# Patient Record
Sex: Male | Born: 1945 | Race: White | Hispanic: No | Marital: Married | Smoking: Never smoker
Health system: Southern US, Community
[De-identification: ages and names within clinical notes are randomized; demographics above are authoritative.]

## PROBLEM LIST (undated history)

## (undated) DIAGNOSIS — I2510 Atherosclerotic heart disease of native coronary artery without angina pectoris: Secondary | ICD-10-CM

## (undated) DIAGNOSIS — E785 Hyperlipidemia, unspecified: Secondary | ICD-10-CM

## (undated) DIAGNOSIS — M1711 Unilateral primary osteoarthritis, right knee: Secondary | ICD-10-CM

## (undated) DIAGNOSIS — C801 Malignant (primary) neoplasm, unspecified: Secondary | ICD-10-CM

## (undated) DIAGNOSIS — M1990 Unspecified osteoarthritis, unspecified site: Secondary | ICD-10-CM

## (undated) HISTORY — PX: COLONOSCOPY: SHX174

## (undated) HISTORY — DX: Atherosclerotic heart disease of native coronary artery without angina pectoris: I25.10

## (undated) HISTORY — PX: OTHER SURGICAL HISTORY: SHX169

## (undated) HISTORY — DX: Hyperlipidemia, unspecified: E78.5

---

## 2000-10-15 HISTORY — PX: CORONARY ANGIOPLASTY WITH STENT PLACEMENT: SHX49

## 2000-11-09 ENCOUNTER — Ambulatory Visit (HOSPITAL_COMMUNITY): Admission: RE | Admit: 2000-11-09 | Discharge: 2000-11-10 | Payer: Self-pay | Admitting: Cardiovascular Disease

## 2000-11-09 ENCOUNTER — Encounter: Payer: Self-pay | Admitting: Cardiovascular Disease

## 2001-02-21 ENCOUNTER — Ambulatory Visit (HOSPITAL_COMMUNITY): Admission: RE | Admit: 2001-02-21 | Discharge: 2001-02-21 | Payer: Self-pay | Admitting: Gastroenterology

## 2006-03-21 ENCOUNTER — Encounter: Admission: RE | Admit: 2006-03-21 | Discharge: 2006-03-21 | Payer: Self-pay | Admitting: Internal Medicine

## 2006-07-15 HISTORY — PX: KNEE ARTHROSCOPY W/ MENISCECTOMY: SHX1879

## 2012-02-09 HISTORY — PX: KNEE ARTHROSCOPY W/ MENISCECTOMY: SHX1879

## 2012-02-16 DIAGNOSIS — Z23 Encounter for immunization: Secondary | ICD-10-CM

## 2012-03-07 HISTORY — PX: US ECHOCARDIOGRAPHY: HXRAD669

## 2013-01-09 ENCOUNTER — Other Ambulatory Visit: Payer: Self-pay | Admitting: *Deleted

## 2013-01-09 MED ORDER — ISOSORBIDE MONONITRATE ER 30 MG PO TB24: 30.0000 mg | ORAL_TABLET | Freq: Every day | ORAL | Status: DC

## 2013-01-09 MED ORDER — RAMIPRIL 10 MG PO CAPS: 10.0000 mg | ORAL_CAPSULE | Freq: Every day | ORAL | Status: DC

## 2013-01-09 MED ORDER — ATORVASTATIN CALCIUM 80 MG PO TABS: 80.0000 mg | ORAL_TABLET | Freq: Every day | ORAL | Status: DC

## 2013-01-09 MED ORDER — EZETIMIBE 10 MG PO TABS: 10.0000 mg | ORAL_TABLET | Freq: Every day | ORAL | Status: DC

## 2013-01-09 MED ORDER — METOPROLOL SUCCINATE ER 25 MG PO TB24: 25.0000 mg | ORAL_TABLET | Freq: Every day | ORAL | Status: DC

## 2013-03-12 ENCOUNTER — Ambulatory Visit: Payer: 59

## 2013-05-29 ENCOUNTER — Encounter: Payer: Self-pay | Admitting: Cardiovascular Disease

## 2013-05-29 ENCOUNTER — Ambulatory Visit (INDEPENDENT_AMBULATORY_CARE_PROVIDER_SITE_OTHER): Payer: 59 | Admitting: Cardiovascular Disease

## 2013-05-29 VITALS — BP 102/60 | HR 66 | Ht 71.0 in | Wt 203.2 lb

## 2013-05-29 DIAGNOSIS — I10 Essential (primary) hypertension: Secondary | ICD-10-CM

## 2013-05-29 DIAGNOSIS — I2510 Atherosclerotic heart disease of native coronary artery without angina pectoris: Secondary | ICD-10-CM

## 2013-05-29 DIAGNOSIS — C439 Malignant melanoma of skin, unspecified: Secondary | ICD-10-CM

## 2013-05-29 DIAGNOSIS — E7849 Other hyperlipidemia: Secondary | ICD-10-CM

## 2013-05-29 DIAGNOSIS — E782 Mixed hyperlipidemia: Secondary | ICD-10-CM

## 2013-05-29 DIAGNOSIS — E785 Hyperlipidemia, unspecified: Secondary | ICD-10-CM

## 2013-05-29 MED ORDER — EZETIMIBE 10 MG PO TABS: 10.0000 mg | ORAL_TABLET | Freq: Every day | ORAL | Status: DC

## 2013-05-29 MED ORDER — RAMIPRIL 10 MG PO CAPS: 10.0000 mg | ORAL_CAPSULE | Freq: Every day | ORAL | Status: DC

## 2013-05-29 MED ORDER — METOPROLOL SUCCINATE ER 25 MG PO TB24: 25.0000 mg | ORAL_TABLET | Freq: Every day | ORAL | Status: DC

## 2013-05-29 MED ORDER — ISOSORBIDE MONONITRATE ER 30 MG PO TB24: 30.0000 mg | ORAL_TABLET | Freq: Every day | ORAL | Status: DC

## 2013-05-29 MED ORDER — ATORVASTATIN CALCIUM 80 MG PO TABS: 80.0000 mg | ORAL_TABLET | Freq: Every day | ORAL | Status: DC

## 2013-05-29 NOTE — Patient Instructions (Signed)
Your physician recommends that you schedule a follow-up appointment in: 1 YEAR.  Your physician recommends that you return for lab work. NMR to be done by PCP.

## 2013-06-14 ENCOUNTER — Encounter: Payer: Self-pay | Admitting: Cardiovascular Disease

## 2013-06-14 DIAGNOSIS — C439 Malignant melanoma of skin, unspecified: Secondary | ICD-10-CM | POA: Insufficient documentation

## 2013-06-14 DIAGNOSIS — I2510 Atherosclerotic heart disease of native coronary artery without angina pectoris: Secondary | ICD-10-CM | POA: Insufficient documentation

## 2013-06-14 DIAGNOSIS — I10 Essential (primary) hypertension: Secondary | ICD-10-CM | POA: Insufficient documentation

## 2013-06-14 DIAGNOSIS — E7849 Other hyperlipidemia: Secondary | ICD-10-CM | POA: Insufficient documentation

## 2013-06-14 NOTE — Progress Notes (Signed)
Patient ID: RAMONTE MENA, male   DOB: 20-Jan-1946, 68 y.o.   MRN: [id number]     HPI: CRAIGE PATEL is a 68 y.o. male who presents to the office for her one-year cardiology evaluation. I last saw him in December 2013.  Mr. 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 When I first saw him in 1996, his total cholesterol is 510 and his LDL cholesterol was 442. Since that time he has been on aggressive combination medical therapy. In June 2002 he underwent stenting of a subtotal occlusion of a circumflex vessel which time a 2.75x23 mm HepaCoat stent was inserted. 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 A nuclear perfusion study in October 2011 continued to show normal perfusion without scar or ischemia.  Over the past year, he has continued to do well. He remains active. 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 He tells me Dr. Sharlett Iles had recently checked laboratory. I will try to get these results.  He has been maintained on atorvastatin 80 mg and Zetia 10 mg for his marked hyperlipidemia. His blood pressure has been treated with metoprolol succinate 25 mg as well as all taste 10 mg. He also is on low dose isosorbide mononitrate and has not been experiencing any recurrent anginal symptoms.  Past Medical History  Diagnosis Date  . Hyperlipidemia     History reviewed. No pertinent past surgical history.  Not on File  Current Outpatient Prescriptions  Medication Sig Dispense Refill  . aspirin 81 MG tablet Take 81 mg by mouth daily.      Marland Kitchen atorvastatin (LIPITOR) 80 MG tablet Take 1 tablet (80 mg total) by mouth daily.  30 tablet  11  . ezetimibe (ZETIA) 10 MG tablet Take 1 tablet (10 mg total) by mouth daily.  30 tablet  11  . isosorbide mononitrate (IMDUR) 30 MG 24 hr  tablet Take 1 tablet (30 mg total) by mouth daily.  30 tablet  11  . metoprolol succinate (TOPROL XL) 25 MG 24 hr tablet Take 1 tablet (25 mg total) by mouth daily.  30 tablet  11  . ramipril (ALTACE) 10 MG capsule Take 1 capsule (10 mg total) by mouth daily.  30 capsule  11  . fluticasone (FLONASE) 50 MCG/ACT nasal spray Place 1 spray into both nostrils as needed.       No current facility-administered medications for this visit.    History   Social History  . Marital Status: Married    Spouse Name: N/A    Number of Children: N/A  . Years of Education: N/A   Occupational History  . Not on file.   Social History Main Topics  . Smoking status: Never Smoker   . Smokeless tobacco: Never Used  . Alcohol Use: 5.0 oz/week    10 drink(s) per week     Comment: wine  . Drug Use: Not on file  . Sexual Activity: Not on file   Other Topics Concern  . Not on file   Social History Narrative  . No narrative on file    Family History  Problem Relation Age of Onset  . Heart disease Mother     ROS is negative for fevers, chills or night sweats.  Recent resection of an early melanoma  by Dr. Jarome Matin. He denies change in vision or hearing. He denies change in activity. There is no wheezing. He denies cough or sputum production. There is no chest pain. There is no presyncope or syncope. He denies abdominal tenderness nausea or vomiting. There is no change in bowel or bladder habits. There is no claudication. There is no edema. Is no history of diabetes mellitus. He denies significant musculoskeletal issues. He denies problems with sleep. There is no thyroid abnormalities. Other comprehensive 14 point system review is negative.  PE BP 102/60  Pulse 66  Ht 5\' 11"  (1.803 m)  Wt 203 lb 3.2 oz (92.171 kg)  BMI 28.35 kg/m2  General: Alert, oriented, no distress.  Skin: normal turgor, no rashes HEENT: Normocephalic, atraumatic. Pupils round and reactive; sclera anicteric;no lid lag.  Extraocular muscles intact. Nose without nasal septal hypertrophy Mouth/Parynx benign; Mallinpatti scale2  Neck: No JVD, no carotid bruits; normal carotid upstroke Lungs: clear to ausculatation and percussion; no wheezing or rales Chest wall: no tenderness to palpitation Heart: RRR, s1 s2 normal faint 1/6 systolic murmur. Abdomen: soft, nontender; no hepatosplenomehaly, BS+; abdominal aorta nontender and not dilated by palpation. Back: no CVA tenderness Pulses 2+ Extremities: no clubbing cyanosis or edema, Homan's sign negative  Neurologic: grossly nonfocal; cranial nerves grossly normal. Psychologic: normal affect and mood.  ECG (independently read by me): Normal sinus rhythm. No significant ST-T changes. PoorR wave progression.  LABS:  BMET No results found for this basename: na, k, cl, co2, glucose, bun, creatinine, calcium, gfrnonaa, gfraa     Hepatic Function Panel  No results found for this basename: prot, albumin, ast, alt, alkphos, bilitot, bilidir, ibili     CBC No results found for this basename: wbc, rbc, hgb, hct, plt, mcv, mch, mchc, rdw, neutrabs, lymphsabs, monoabs, eosabs, basosabs     BNP No results found for this basename: probnp    Lipid Panel  No results found for this basename: chol, trig, hdl, cholhdl, vldl, ldlcalc     RADIOLOGY: No results found.    ASSESSMENT AND PLAN: Mr. 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 Over the last 19 years, we have been aggressively managing his lipid studies. He has been maintained on atorvastatin 80 mg in addition to Zetia 10 mg with good results. His blood pressure is well-controlled today on metoprolol 25 mg and Altace 10 mg. He is not having any anginal symptoms on his current dose of isosorbide mononitrate in addition to his beta blocker therapy. He did have recent laboratory done by  Dr. Sharlett Iles and I will try to obtain these results to make certain he is still being aggressively treated. If he is not, he may also only be a candidate for an investigative drug trial currently ongoing which may significantly inhibit the degradation of the  LDL receptor.  He tells me he also underwent recent resection for an early melanoma and is doing well. As long as he remains stable I will see him in one year for followup evaluation.     Troy Sine, MD, [facility]  06/14/2013 10:48 PM

## 2013-09-24 ENCOUNTER — Telehealth: Payer: Self-pay | Admitting: Cardiovascular Disease

## 2013-09-24 ENCOUNTER — Other Ambulatory Visit: Payer: Self-pay | Admitting: *Deleted

## 2013-09-24 MED ORDER — EZETIMIBE 10 MG PO TABS: 10.0000 mg | ORAL_TABLET | Freq: Every day | ORAL | Status: DC

## 2013-09-24 NOTE — Telephone Encounter (Signed)
Needs Zetia refill--Did not get filled when he picked up  Going out of town and said pharmacy submitted.  Please call

## 2014-01-11 NOTE — Telephone Encounter (Signed)
Refilled #[phone] with 11 refills on 09/24/13

## 2014-05-22 ENCOUNTER — Telehealth: Payer: Self-pay | Admitting: Cardiovascular Disease

## 2014-05-22 NOTE — Telephone Encounter (Signed)
Returned call to patient he stated already taken care of.

## 2014-05-22 NOTE — Telephone Encounter (Signed)
Pt called in stating that [facility] faxed over a prior authorization form for a new medication that they are recommending he start taking. Please call and notify him that the fax was received.   Thanks

## 2014-06-19 ENCOUNTER — Other Ambulatory Visit: Payer: Self-pay

## 2014-06-19 MED ORDER — ATORVASTATIN CALCIUM 80 MG PO TABS: 80.0000 mg | ORAL_TABLET | Freq: Every day | ORAL | Status: DC

## 2014-06-19 MED ORDER — RAMIPRIL 10 MG PO CAPS: 10.0000 mg | ORAL_CAPSULE | Freq: Every day | ORAL | Status: DC

## 2014-06-19 MED ORDER — ISOSORBIDE MONONITRATE ER 30 MG PO TB24: 30.0000 mg | ORAL_TABLET | Freq: Every day | ORAL | Status: DC

## 2014-06-19 MED ORDER — METOPROLOL SUCCINATE ER 25 MG PO TB24: 25.0000 mg | ORAL_TABLET | Freq: Every day | ORAL | Status: DC

## 2014-06-19 NOTE — Telephone Encounter (Signed)
Rx sent to pharmacy   

## 2014-06-24 ENCOUNTER — Ambulatory Visit: Payer: Self-pay | Admitting: Cardiovascular Disease

## 2014-07-05 ENCOUNTER — Ambulatory Visit (INDEPENDENT_AMBULATORY_CARE_PROVIDER_SITE_OTHER): Payer: 59 | Admitting: Cardiovascular Disease

## 2014-07-05 VITALS — BP 130/60 | HR 54 | Ht 70.0 in | Wt 202.0 lb

## 2014-07-05 DIAGNOSIS — E7849 Other hyperlipidemia: Secondary | ICD-10-CM

## 2014-07-05 DIAGNOSIS — E785 Hyperlipidemia, unspecified: Secondary | ICD-10-CM

## 2014-07-05 DIAGNOSIS — Z01818 Encounter for other preprocedural examination: Secondary | ICD-10-CM

## 2014-07-05 DIAGNOSIS — I2510 Atherosclerotic heart disease of native coronary artery without angina pectoris: Secondary | ICD-10-CM

## 2014-07-05 DIAGNOSIS — I10 Essential (primary) hypertension: Secondary | ICD-10-CM

## 2014-07-05 MED ORDER — ATORVASTATIN CALCIUM 80 MG PO TABS: 80.0000 mg | ORAL_TABLET | Freq: Every day | ORAL | Status: DC

## 2014-07-05 MED ORDER — METOPROLOL SUCCINATE ER 25 MG PO TB24: 25.0000 mg | ORAL_TABLET | Freq: Every day | ORAL | Status: DC

## 2014-07-05 MED ORDER — RAMIPRIL 10 MG PO CAPS: 10.0000 mg | ORAL_CAPSULE | Freq: Every day | ORAL | Status: DC

## 2014-07-05 MED ORDER — EZETIMIBE 10 MG PO TABS: 10.0000 mg | ORAL_TABLET | Freq: Every day | ORAL | Status: DC

## 2014-07-05 MED ORDER — ISOSORBIDE MONONITRATE ER 30 MG PO TB24: 30.0000 mg | ORAL_TABLET | Freq: Every day | ORAL | Status: DC

## 2014-07-05 NOTE — Patient Instructions (Addendum)
Your physician has requested that you have a lexiscan myoview. This will be done in mid March..   Your physician recommends that you schedule a follow-up appointment in: end of March or first of April with Dr. Claiborne Billings.

## 2014-07-06 ENCOUNTER — Encounter: Payer: Self-pay | Admitting: Cardiovascular Disease

## 2014-07-06 DIAGNOSIS — Z01818 Encounter for other preprocedural examination: Secondary | ICD-10-CM | POA: Insufficient documentation

## 2014-07-06 NOTE — Progress Notes (Signed)
Patient ID: CHESNEY KLIMASZEWSKI, male   DOB: [birth year], 69 y.o.   MRN: [id number]     HPI: KENTA LASTER is a 69 y.o. male who presents to the office fora one-year cardiology evaluation. I last saw him in January 2015.  Mr. 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  At that time, he had told me that he had first been told of having high cholesterol approximatey 8 years previously with cholesterols in excess of 400, but he was not on any therapy.  He had undergone a routine treadmill test which reportely was normal by Dr. Sherald Barge.  Of note, his mother had very high cholesterols throughout her life and died of a myocardial infarction at age 68. When the patient was initially referred to me, his laboratory was notable for a total cholesterol of 510 and LDL cholesterol of 422.  Since my initial evaluation, he was started on aspirin therapy as well as initial statin treatment with simvastatin..  As more aggressive statins became available, he ultimately was treated with atorvastatin 80 mg in addition to Zetia 10 mg. Of note a review of his medical record indicates that there was a short period of time when he was traveling excessively and had stopped taking therapy. Subsequent blood work in 1999.upon his return showed his cholesterol had risen back to 343 with an LDL cholesterol of 245.   A nuclear perfusion study in 2002 demonstrated new development of inferolateral ischemia. In June 2002 he underwent stenting of a subtotal occlusion of a circumflex vessel which time a 2.75x23 mm HepaCoat stent was inserted. 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 A nuclear perfusion study in October 2011 continued to show normal  perfusion without scar or ischemia.   Last year, he has continued to do well. He remains active. 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 He tells me Dr. Sharlett Iles had recently checked laboratory. I will try to get these results.  He has been maintained on atorvastatin 80 mg and Zetia 10 mg for his marked hyperlipidemia. His blood pressure has been treated with metoprolol succinate 25 mg as well as altace 10 mg. He also is on low dose isosorbide mononitrate and has not been experiencing any recurrent anginal symptoms.  He recently had blood work done by Dr. Durene Cal on 06/17/2014.  On current therapy, total cholesterol is 194, triglycerides 72, HDL 66, and LDL cholesterol is 114. Liver tests are normal. Renal function is normal with a creatinine of 0.8.  Fasting glucose was 94.   Mr. Mcnairy may be undergoing knee replacement surgery in April. He presents for evaluation  Past Medical History  Diagnosis Date  . Hyperlipidemia     No past surgical history on file.  Not on File  Current Outpatient Prescriptions  Medication Sig Dispense Refill  . aspirin 81 MG tablet Take 81 mg by mouth daily.    Marland Kitchen atorvastatin (LIPITOR) 80 MG tablet Take 1 tablet (80 mg total) by mouth daily. 30 tablet 11  . ezetimibe (ZETIA) 10 MG tablet Take 1 tablet (10 mg total) by mouth  daily. 30 tablet 11  . fluticasone (FLONASE) 50 MCG/ACT nasal spray Place 1 spray into both nostrils as needed.    . isosorbide mononitrate (IMDUR) 30 MG 24 hr tablet Take 1 tablet (30 mg total) by mouth daily. 30 tablet 11  . meloxicam (MOBIC) 15 MG tablet Take 1 tablet by mouth daily.  0  . metoprolol succinate (TOPROL XL) 25 MG 24 hr tablet Take 1 tablet (25 mg total) by mouth daily. 30 tablet 11  . ramipril (ALTACE) 10 MG capsule Take 1 capsule (10 mg total) by mouth daily. 30 capsule 11  . VOLTAREN 1 % GEL Apply 1 application topically daily.   0   No current facility-administered medications for this visit.    History   Social History  . Marital Status: Married    Spouse Name: N/A  . Number of Children: N/A  . Years of Education: N/A   Occupational History  . Not on file.   Social History Main Topics  . Smoking status: Never Smoker   . Smokeless tobacco: Never Used  . Alcohol Use: 5.0 oz/week    10 drink(s) per week     Comment: wine  . Drug Use: Not on file  . Sexual Activity: Not on file   Other Topics Concern  . Not on file   Social History Narrative  . No narrative on file    Family History  Problem Relation Age of Onset  . Heart disease Mother     ROS General: Negative; No fevers, chills, or night sweats;  HEENT: Negative; No changes in vision or hearing, sinus congestion, difficulty swallowing Pulmonary: Negative; No cough, wheezing, shortness of breath, hemoptysis Cardiovascular: Negative; No chest pain, presyncope, syncope, palpitations GI: Negative; No nausea, vomiting, diarrhea, or abdominal pain GU: Negative; No dysuria, hematuria, or difficulty voiding Musculoskeletal: Negative; no myalgias, joint pain, or weakness Hematologic/Oncology: Negative; no easy bruising, bleeding Endocrine: Negative; no heat/cold intolerance; no diabetes Neuro: Negative; no changes in balance, headaches Skin:  History of melanoma resection Psychiatric: Negative; No behavioral problems, depression Sleep: Negative; No snoring, daytime sleepiness, hypersomnolence, bruxism, restless legs, hypnogognic hallucinations, no cataplexy Other comprehensive 14 point system review is negative.   PE BP 130/60 mmHg  Pulse 54  Ht 5\' 10"  (1.778 m)  Wt 202 lb (91.627 kg)  BMI 28.98 kg/m2  General: Alert, oriented, no distress.  Skin: normal turgor, no rashes HEENT: Normocephalic, atraumatic. Pupils round and reactive; sclera anicteric;no lid lag. Extraocular muscles intact. Positive for arcus cornealis. Nose without nasal  septal hypertrophy Mouth/Parynx benign; Mallinpatti scale2  Neck: No JVD, no carotid bruits; normal carotid upstroke Lungs: clear to ausculatation and percussion; no wheezing or rales Chest wall: no tenderness to palpitation Heart: RRR, s1 s2 normal faint 1/6 systolic murmur. Abdomen: soft, nontender; no hepatosplenomehaly, BS+; abdominal aorta nontender and not dilated by palpation. Back: no CVA tenderness Pulses 2+ Extremities: no clubbing cyanosis or edema, Homan's sign negative  Neurologic: grossly nonfocal; cranial nerves grossly normal. Psychologic: normal affect and mood.  ECG (independently read by me): Sinus bradycardia 54 bpm.  Normal intervals.  No ST segment changes.  January 2015ECG (independently read by me): Normal sinus rhythm. No significant ST-T changes. PoorR wave progression.  LABS:  BMET No results found for: NA   Hepatic Function Panel  No results found for: PROT   CBC No results found for: WBC   BNP No results found for: PROBNP  Lipid Panel  No results found for: CHOL   RADIOLOGY:  No results found.    ASSESSMENT AND PLAN: Mr. Ferrel Simington is a 69 year old gentleman who most likely has familial hyperlipidemia with initial cholesterols in excess of 500 and LDL cholesterols in excess of 400. Over the last 20 years, I have been aggressively managing his lipid studies. Most recently he has been maintained on atorvastatin 80 mg in addition to Zetia 10 mg with good results.  I've been monitoring him for CAD, and in 2002 he was found to have a subtotally occluded obtuse marginal 1 stenosis after nuclear study suggested inferolateral ischemia.  He did have concomitant CAD, also involving his LAD and RCA. His blood pressure today is well-controlled today on metoprolol 25 mg and Altace 10 mg. He is not having any anginal symptoms on his current dose of isosorbide mononitrate in addition to his beta blocker therapy.  When I saw him last year, I felt he may be a  candidate for be with PCSK9 inhibition once these became FDA approved. He did have recent laboratory done by Dr. Sharlett Iles . Presently, his LDL cholesterol remains elevated at 114 despite maximal therapy with atorvastatin 80 mg in addition to Zetia 10 mg. He has also taken fish oil the past. I had a long discussion with him today concerning the physiology related to PCSK9 and the PCSK9 inhibitors which are now on the market which inhibit degradation of the LDL receptor. Based on the [place] diagnostic criteria he has familial hypercholesterolemia with a score of at least 13. In addition, I have taken care of his daughter, who also has elevated cholesterol and is [place] diagnostic criteria score may actually be higher.  While he was in the office today, I had Drucilla Schmidt come into the room to discuss with him potential  Enrollment for [place].  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  We will submit his diagnostic information to his insurance company and I fully anticipate that he will be approved for insurance coverage of this human monoclonal antibody with great promise to reduce potential future CAD.  With his upcoming need for possible knee replacement surgery, I am scheduling him to undergo a nuclear perfusion study in March, and I will see him in follow-up of this study prior to giving clearance for this  orthopedic procedure.   Time spent: 40 minutes   Troy Sine, MD, [facility]  07/06/2014 12:12 PM

## 2014-07-16 ENCOUNTER — Telehealth: Payer: Self-pay | Admitting: *Deleted

## 2014-07-16 ENCOUNTER — Encounter: Payer: Self-pay | Admitting: Cardiovascular Disease

## 2014-07-16 NOTE — Telephone Encounter (Signed)
Faxed notice that patient cannot be cleared to have knee replacement until after his scheduled stress test on 07/31/14.

## 2014-07-23 ENCOUNTER — Telehealth: Payer: Self-pay | Admitting: *Deleted

## 2014-07-23 NOTE — Telephone Encounter (Signed)
Faxed clearance to [facility] informing them that patient cannot be cleared until after his 07/31/14 stress test.

## 2014-07-26 ENCOUNTER — Telehealth (HOSPITAL_COMMUNITY): Payer: Self-pay

## 2014-07-26 NOTE — Telephone Encounter (Signed)
Encounter complete. 

## 2014-07-31 ENCOUNTER — Ambulatory Visit (HOSPITAL_COMMUNITY)
Admission: RE | Admit: 2014-07-31 | Discharge: 2014-07-31 | Disposition: A | Discharge status: Home / Self Care | Payer: 59 | Source: Ambulatory Visit | Attending: Internal Medicine | Admitting: Internal Medicine

## 2014-07-31 DIAGNOSIS — I2510 Atherosclerotic heart disease of native coronary artery without angina pectoris: Secondary | ICD-10-CM

## 2014-07-31 DIAGNOSIS — I10 Essential (primary) hypertension: Secondary | ICD-10-CM | POA: Diagnosis not present

## 2014-07-31 MED ORDER — REGADENOSON 0.4 MG/5ML IV SOLN
0.4000 mg | Freq: Once | INTRAVENOUS | Status: AC
Start: 2014-07-31 — End: 2014-07-31
  Administered 2014-07-31: 0.4 mg via INTRAVENOUS

## 2014-07-31 MED ORDER — TECHNETIUM TC 99M SESTAMIBI GENERIC - CARDIOLITE
10.8000 | Freq: Once | INTRAVENOUS | Status: AC | PRN
  Administered 2014-07-31: 11 via INTRAVENOUS

## 2014-07-31 MED ORDER — TECHNETIUM TC 99M SESTAMIBI GENERIC - CARDIOLITE
31.8000 | Freq: Once | INTRAVENOUS | Status: AC | PRN
  Administered 2014-07-31: 31.8 via INTRAVENOUS

## 2014-07-31 NOTE — Procedures (Addendum)
[facility] [address] [place] [address] [phone]  Cardiology Nuclear Med Study  Henry Morrison is a 69 y.o. male     MRN : [id number]     DOB: [birth year]  Procedure Date: 07/31/2014  Nuclear Med Background Indication for Stress Test:  Surgical Clearance and Follow up CAD History:  CAD;STENTING/PTCA X1 10/2000;Last NUC MPI in 2011 (results not available in EPIC) Cardiac Risk Factors: Family History - CAD, Hypertension, Lipids and Overweight  Symptoms:  Pt denies symptoms at this time.   Nuclear Pre-Procedure Caffeine/Decaff Intake:  9:00pm NPO After: 5:00am   IV Site: R Forearm  IV 0.9% NS with Angio Cath:  22g  Chest Size (in):  44" IV Started by: Rolene Course, RN  Height: 5\' 10"  (1.778 m)  Cup Size: n/a  BMI:  Body mass index is 28.98 kg/(m^2). Weight:  202 lb (91.627 kg)   Tech Comments:  n/a    Nuclear Med Study 1 or 2 day study: 1 day  Stress Test Type:  [place] Provider:  Shelva Majestic, MD   Resting Radionuclide: Technetium 76m Sestamibi  Resting Radionuclide Dose: 10.8 mCi   Stress Radionuclide:  Technetium 44m Sestamibi  Stress Radionuclide Dose: 31.8 mCi           Stress Protocol Rest HR: 48 Stress HR: 67  Rest BP: 148/85 Stress BP: 147/101  Exercise Time (min): n/a METS: n/a          Dose of Adenosine (mg):  n/a Dose of Lexiscan: 0.4 mg  Dose of Atropine (mg): n/a Dose of Dobutamine: n/a mcg/kg/min (at max HR)  Stress Test Technologist: Leane Para, CCT Nuclear Technologist: Anthony Sar   Rest Procedure:  Myocardial perfusion imaging was performed at rest 45 minutes following the intravenous administration of Technetium 68m Sestamibi. Stress Procedure:  The patient received IV Lexiscan 0.4 mg over 15-seconds.  Technetium 19m Sestamibi injected IV at 30-seconds.  There were no significant changes with Lexiscan.  Quantitative spect images were obtained after a  45 minute delay.  Transient Ischemic Dilatation (Normal <1.22):  1.17  QGS EDV:  129 ml QGS ESV:  52 ml LV Ejection Fraction: 60%  Rest ECG: NSR - Normal EKG  Stress ECG: No significant change from baseline ECG  QPS Raw Data Images:  Normal; no motion artifact; normal heart/lung ratio. Stress Images:  basal septal defect Rest Images:  basal septal defect Subtraction (SDS):  No evidence of ischemia.  Impression Exercise Capacity:  Lexiscan with no exercise. BP Response:  Normal blood pressure response. Clinical Symptoms:  No significant symptoms noted. ECG Impression:  No significant ECG changes with Lexiscan. Comparison with Prior Nuclear Study: No images to compare  Overall Impression:  Low risk stress nuclear study with small, fixed basal septal artifact. No reversible ischemia.  LV Wall Motion:  NL LV Function; NL Wall Motion: LVEF 60%  Pixie Casino, MD, [facility] Board Certified in Nuclear Cardiology Attending Cardiologist Rule, MD  07/31/2014 1:30 PM

## 2014-08-06 ENCOUNTER — Encounter: Payer: Self-pay | Admitting: *Deleted

## 2014-08-09 ENCOUNTER — Encounter: Payer: Self-pay | Admitting: *Deleted

## 2014-08-27 ENCOUNTER — Ambulatory Visit: Payer: 59 | Admitting: Cardiovascular Disease

## 2014-10-18 ENCOUNTER — Telehealth: Payer: Self-pay | Admitting: Cardiovascular Disease

## 2014-10-18 NOTE — Telephone Encounter (Signed)
Spoke to [place] at [facility]  NO LONGER WILL BE  DOING PRIOR  AUTHORIZATION ON [place]. WILL NO LONGER EXCEPTING NEW  PRIOR AUTHORIZATION. NOTIFIED the pharmacist -KRISTIN .

## 2014-11-16 ENCOUNTER — Other Ambulatory Visit: Payer: Self-pay | Admitting: Pharmacist Clinician (PhC)/ Clinical Pharmacy Specialist

## 2014-11-16 DIAGNOSIS — E7849 Other hyperlipidemia: Secondary | ICD-10-CM

## 2014-12-17 ENCOUNTER — Other Ambulatory Visit: Payer: Self-pay | Admitting: Cardiovascular Disease

## 2014-12-17 LAB — HEPATIC FUNCTION PANEL
ALBUMIN: 4.4 g/dL (ref 3.6–5.1)
ALT: 44 U/L (ref 9–46)
AST: 28 U/L (ref 10–35)
Alkaline Phosphatase: 50 U/L (ref 40–115)
BILIRUBIN INDIRECT: 0.6 mg/dL (ref 0.2–1.2)
Bilirubin, Direct: 0.2 mg/dL (ref <=0.2)
TOTAL PROTEIN: 6.9 g/dL (ref 6.1–8.1)
Total Bilirubin: 0.8 mg/dL (ref 0.2–1.2)

## 2014-12-23 ENCOUNTER — Telehealth: Payer: Self-pay | Admitting: Cardiovascular Disease

## 2014-12-23 NOTE — Telephone Encounter (Signed)
Pt had liver panel drawn, lipids did not get drawn.  Pt to come by for copy of lab order and go to lab on first floor Tuesday.

## 2014-12-23 NOTE — Telephone Encounter (Signed)
kristen do you know anything about a lipid panel or the reason the hepatic panel was drawn?  Looks like the orders were  Placed by you.

## 2014-12-23 NOTE — Telephone Encounter (Signed)
Pt is calling in to see if his results from his labs he took for his cholesterol last week were available. Please call  Thanks

## 2014-12-24 ENCOUNTER — Other Ambulatory Visit: Payer: Self-pay | Admitting: Cardiovascular Disease

## 2014-12-25 LAB — LIPID PANEL
CHOL/HDL RATIO: 1.7 ratio (ref <=5.0)
CHOLESTEROL: 150 mg/dL (ref 125–200)
HDL: 89 mg/dL (ref >=40)
LDL CALC: 43 mg/dL (ref <130)
TRIGLYCERIDES: 92 mg/dL (ref <150)
VLDL: 18 mg/dL (ref <30)

## 2014-12-30 ENCOUNTER — Telehealth: Payer: Self-pay | Admitting: Pharmacist Clinician (PhC)/ Clinical Pharmacy Specialist

## 2014-12-30 NOTE — Progress Notes (Signed)
LMOM for patient with results.

## 2014-12-30 NOTE — Telephone Encounter (Signed)
LM for patient with cholesterol labs.  Advised he call if any questions/concerns.

## 2015-01-06 ENCOUNTER — Encounter: Payer: Self-pay | Admitting: *Deleted

## 2015-04-09 ENCOUNTER — Telehealth: Payer: Self-pay | Admitting: Pharmacist Clinician (PhC)/ Clinical Pharmacy Specialist

## 2015-04-09 DIAGNOSIS — E7849 Other hyperlipidemia: Secondary | ICD-10-CM

## 2015-04-09 NOTE — Telephone Encounter (Signed)
Starting PA for continued Praluent use.  Pt will need to have lipid labs drawn, last were > 3 months ago

## 2015-04-14 ENCOUNTER — Other Ambulatory Visit: Payer: Self-pay | Admitting: Cardiovascular Disease

## 2015-04-14 LAB — HEPATIC FUNCTION PANEL
ALT: 42 U/L (ref 9–46)
AST: 27 U/L (ref 10–35)
Albumin: 4.3 g/dL (ref 3.6–5.1)
Alkaline Phosphatase: 57 U/L (ref 40–115)
BILIRUBIN DIRECT: 0.2 mg/dL (ref <=0.2)
Indirect Bilirubin: 0.6 mg/dL (ref 0.2–1.2)
TOTAL PROTEIN: 6.8 g/dL (ref 6.1–8.1)
Total Bilirubin: 0.8 mg/dL (ref 0.2–1.2)

## 2015-04-14 LAB — LIPID PANEL
CHOLESTEROL: 141 mg/dL (ref 125–200)
HDL: 93 mg/dL (ref >=40)
LDL Cholesterol: 36 mg/dL (ref <130)
TRIGLYCERIDES: 59 mg/dL (ref <150)
Total CHOL/HDL Ratio: 1.5 Ratio (ref <=5.0)
VLDL: 12 mg/dL (ref <30)

## 2015-04-21 ENCOUNTER — Encounter: Payer: Self-pay | Admitting: Pharmacist Clinician (PhC)/ Clinical Pharmacy Specialist

## 2015-04-21 NOTE — Progress Notes (Signed)
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 At that time, he had told me that he had first been told of having high cholesterol approximatey 8 years previously with cholesterols in excess of 400, but he was not on any therapy. He had undergone a routine treadmill test which reportely was normal by Dr. Sherald Barge. Of note, his mother had very high cholesterols throughout her life and died of a myocardial infarction at age 23. When the patient was initially referred to me, his laboratory was notable for a total cholesterol of 510 and LDL cholesterol of 422. Since my initial evaluation, he was started on aspirin therapy as well as initial statin treatment with simvastatin.. As more aggressive statins became available, he ultimately was treated with atorvastatin 80 mg in addition to Zetia 10 mg. Of note a review of his medical record indicates that there was a short period of time when he was traveling excessively and had stopped taking therapy. Subsequent blood work in 1999.upon his return showed his cholesterol had risen back to 343 with an LDL cholesterol of 245.   He has been maintained on atorvastatin 80 mg and Zetia 10 mg for his marked hyperlipidemia. On current therapy, total cholesterol is 194, triglycerides 72, HDL 66, and LDL cholesterol is 114. Liver tests are normal. Renal function is normal with a creatinine of 0.8. Fasting glucose was 94.  Based on the [place] diagnostic criteria he has familial hypercholesterolemia with a score of at least 13.    He was previously started on Praluent 75 mg earlier this year and has seen a remarkable drop in his LDL.  Most current labs show TC 141, TG 59, HDL 93 and LDL 36.   He continues to take his ezetimibe and atorvastatin.  He also remains  active and follows a heart healthy diet.   He is due to follow up with Dr. Claiborne Billings in February.    Henry Morrison Pharm D CPP [facility] at [employer]

## 2015-04-29 ENCOUNTER — Telehealth: Payer: Self-pay | Admitting: *Deleted

## 2015-04-29 NOTE — Telephone Encounter (Signed)
-----   Message from Troy Sine, MD sent at 04/29/2015 12:16 PM EST ----- Lipids outstanding on PCSK9 inhibitor!!!

## 2015-04-30 NOTE — Telephone Encounter (Signed)
Spoke with patient, had previously reviewed labs with him when re-applying for Praluent.   PA approved yesterday for 1 year, now good until Apr 28, 2016.  Patient aware

## 2015-05-01 ENCOUNTER — Encounter: Payer: Self-pay | Admitting: *Deleted

## 2015-05-05 ENCOUNTER — Telehealth: Payer: Self-pay | Admitting: Cardiovascular Disease

## 2015-05-05 NOTE — Telephone Encounter (Signed)
Returning your call from last week. °

## 2015-05-06 NOTE — Telephone Encounter (Signed)
1. What office are you calling from? (pt calling in) Elsie Saas  2. What is your office phone and fax number? [phone](tele)  [phone])  3. What type of procedure is the patient having performed? DJD right knee  4. What date is procedure scheduled? 1/23  5. What is your question (ex. Antibiotics prior to procedure, holding medication-we need to know how long dentist wants pt to hold med)? Does he have to come in for clearance?  Pt was last seen on 07/05/2014

## 2015-05-07 NOTE — Telephone Encounter (Signed)
Received a call from patient.He stated he was calling to get surgical clearance from [facility] to have up coming knee surgery.  Spoke to [facility] he advised patient is cleared from a cardiology standpoint to have knee surgery.Note faxed to Dr.Wainer's office at fax # [phone].

## 2015-05-22 ENCOUNTER — Encounter (HOSPITAL_COMMUNITY): Payer: Self-pay | Admitting: Physician Assistant

## 2015-05-22 NOTE — H&P (Addendum)
TOTAL KNEE ADMISSION H&P  Patient is being admitted for right total knee arthroplasty.  Subjective:  Chief Complaint:right knee pain.  HPI: Henry Morrison, 70 y.o. male, has a history of pain and functional disability in the right knee due to arthritis and has failed non-surgical conservative treatments for greater than 12 weeks to includeNSAID's and/or analgesics, corticosteriod injections, viscosupplementation injections, flexibility and strengthening excercises, supervised PT with diminished ADL's post treatment, weight reduction as appropriate and activity modification.  Onset of symptoms was gradual, starting 8 years ago with gradually worsening course since that time. The patient noted prior procedures on the knee to include  arthroscopy and menisectomy on the right knee(s).  Patient currently rates pain in the right knee(s) at 9 out of 10 with activity. Patient has night pain, worsening of pain with activity and weight bearing, pain that interferes with activities of daily living, crepitus and joint swelling.  Patient has evidence of subchondral sclerosis, periarticular osteophytes and joint space narrowing by imaging studies.  There is no active infection.  Patient Active Problem List   Diagnosis Date Noted  . Preoperative clearance 07/06/2014  . CAD (coronary artery disease) 06/14/2013  . Familial hyperlipidemia, high LDL 06/14/2013  . Essential hypertension 06/14/2013  . Melanoma ([place]) 06/14/2013   Past Medical History  Diagnosis Date  . Hyperlipidemia   . CAD (coronary artery disease)     Past Surgical History  Procedure Laterality Date  . Coronary angioplasty with stent placement  10/2000    stenting of the CX vessel  . US echocardiography  03/07/2012    Trace AI,mild MR,mild to mod TR  . Knee arthroscopy w/ meniscectomy Left 07/15/2006  . Knee arthroscopy w/ meniscectomy Right 02/09/2012    No prescriptions prior to admission   No Known Allergies  Social History   Substance Use Topics  . Smoking status: Never Smoker   . Smokeless tobacco: Never Used  . Alcohol Use: 6.0 oz/week    10 Standard drinks or equivalent per week     Comment: wine    Family History  Problem Relation Age of Onset  . Heart disease Mother   . Cancer Father     kidney     Review of Systems  Constitutional: Negative.   HENT: Negative.   Eyes: Negative.   Respiratory: Negative.   Cardiovascular: Negative.   Gastrointestinal: Negative.   Genitourinary: Negative.   Musculoskeletal: Positive for joint pain.       Irght knee pain and right shoulder pain  Skin: Negative.   Neurological: Negative.   Endo/Heme/Allergies: Negative.   Psychiatric/Behavioral: Negative.     Objective:  Physical Exam  Constitutional: He is oriented to person, place, and time. He appears well-developed and well-nourished.  HENT:  Head: Normocephalic and atraumatic.  Eyes: Conjunctivae are normal. Pupils are equal, round, and reactive to light.  Neck: Neck supple.  Cardiovascular: Normal rate, regular rhythm and normal heart sounds.   Respiratory: Effort normal and breath sounds normal.  GI: Bowel sounds are normal.  Genitourinary:  Not pertinent to current symptomatology therefore not examined.  Musculoskeletal:  Right knee has pain medially and pain laterally.  2+ crepitus.  2+ synovitis.  Distal neurovascular exam is intact.  Left knee has full range of motion.  1+ crepitus.  1+ synovitis.  Distal neurovascular exam is intact.    Neurological: He is alert and oriented to person, place, and time.  Skin: Skin is warm and dry.  Psychiatric: He has a normal mood and  affect. His behavior is normal.    Vital signs in last 24 hours: Temp:  [97.9 F (36.6 C)] 97.9 F (36.6 C) (01/05 1100) Pulse Rate:  [64] 64 (01/05 1100) BP: (152)/(81) 152/81 mmHg (01/05 1100) SpO2:  [97 %] 97 % (01/05 1100) Weight:  [91.627 kg (202 lb)] 91.627 kg (202 lb) (01/05 1100)  Labs:   Estimated body  mass index is 29.82 kg/(m^2) as calculated from the following:   Height as of this encounter: 5\' 9"  (1.753 m).   Weight as of this encounter: 91.627 kg (202 lb).   Imaging Review Plain radiographs demonstrate severe degenerative joint disease of the right knee(s). The overall alignment issignificant varus. The bone quality appears to be good for age and reported activity level.  Assessment/Plan:  End stage arthritis, right knee   The patient history, physical examination, clinical judgment of the provider and imaging studies are consistent with end stage degenerative joint disease of the right knee(s) and total knee arthroplasty is deemed medically necessary. The treatment options including medical management, injection therapy arthroscopy and arthroplasty were discussed at length. The risks and benefits of total knee arthroplasty were presented and reviewed. The risks due to aseptic loosening, infection, stiffness, patella tracking problems, thromboembolic complications and other imponderables were discussed. The patient acknowledged the explanation, agreed to proceed with the plan and consent was signed. Patient is being admitted for inpatient treatment for surgery, pain control, PT, OT, prophylactic antibiotics, VTE prophylaxis, progressive ambulation and ADL's and discharge planning. The patient is planning to be discharged home with home health services.  MSSA positive    Will put antibiotics in cement.

## 2015-06-02 ENCOUNTER — Encounter (HOSPITAL_COMMUNITY): Payer: Self-pay

## 2015-06-02 ENCOUNTER — Encounter (HOSPITAL_COMMUNITY)
Admission: RE | Admit: 2015-06-02 | Discharge: 2015-06-02 | Disposition: A | Discharge status: Home / Self Care | Payer: 59 | Source: Ambulatory Visit | Attending: Orthopedic Surgery | Admitting: Orthopedic Surgery

## 2015-06-02 DIAGNOSIS — M1711 Unilateral primary osteoarthritis, right knee: Secondary | ICD-10-CM | POA: Insufficient documentation

## 2015-06-02 DIAGNOSIS — Z0183 Encounter for blood typing: Secondary | ICD-10-CM | POA: Insufficient documentation

## 2015-06-02 DIAGNOSIS — Z01812 Encounter for preprocedural laboratory examination: Secondary | ICD-10-CM | POA: Insufficient documentation

## 2015-06-02 HISTORY — DX: Malignant (primary) neoplasm, unspecified: C80.1

## 2015-06-02 HISTORY — DX: Unspecified osteoarthritis, unspecified site: M19.90

## 2015-06-02 LAB — CBC WITH DIFFERENTIAL/PLATELET
BASOS ABS: 0 10*3/uL (ref 0.0–0.1)
Basophils Relative: 0 %
EOS PCT: 3 %
Eosinophils Absolute: 0.2 10*3/uL (ref 0.0–0.7)
HCT: 40.9 % (ref 39.0–52.0)
Hemoglobin: 13.6 g/dL (ref 13.0–17.0)
LYMPHS PCT: 30 %
Lymphs Abs: 2.1 10*3/uL (ref 0.7–4.0)
MCH: 31.1 pg (ref 26.0–34.0)
MCHC: 33.3 g/dL (ref 30.0–36.0)
MCV: 93.6 fL (ref 78.0–100.0)
Monocytes Absolute: 0.5 10*3/uL (ref 0.1–1.0)
Monocytes Relative: 7 %
Neutro Abs: 4.1 10*3/uL (ref 1.7–7.7)
Neutrophils Relative %: 60 %
Platelets: 217 10*3/uL (ref 150–400)
RBC: 4.37 MIL/uL (ref 4.22–5.81)
RDW: 13 % (ref 11.5–15.5)
WBC: 6.9 10*3/uL (ref 4.0–10.5)

## 2015-06-02 LAB — COMPREHENSIVE METABOLIC PANEL
ALT: 33 U/L (ref 17–63)
AST: 29 U/L (ref 15–41)
Albumin: 4 g/dL (ref 3.5–5.0)
Alkaline Phosphatase: 53 U/L (ref 38–126)
Anion gap: 7 (ref 5–15)
BUN: 27 mg/dL — AB (ref 6–20)
CHLORIDE: 109 mmol/L (ref 101–111)
CO2: 26 mmol/L (ref 22–32)
CREATININE: 0.9 mg/dL (ref 0.61–1.24)
Calcium: 9.3 mg/dL (ref 8.9–10.3)
GFR calc Af Amer: >60 mL/min (ref >60)
GFR calc non Af Amer: >60 mL/min (ref >60)
GLUCOSE: 112 mg/dL — AB (ref 65–99)
Potassium: 4.5 mmol/L (ref 3.5–5.1)
SODIUM: 142 mmol/L (ref 135–145)
Total Bilirubin: 0.8 mg/dL (ref 0.3–1.2)
Total Protein: 6.6 g/dL (ref 6.5–8.1)

## 2015-06-02 LAB — TYPE AND SCREEN
ABO/RH(D): A POS
Antibody Screen: NEGATIVE

## 2015-06-02 LAB — APTT: aPTT: 26 seconds (ref 24–37)

## 2015-06-02 LAB — PROTIME-INR
INR: 1.01 (ref 0.00–1.49)
Prothrombin Time: 13.5 seconds (ref 11.6–15.2)

## 2015-06-02 LAB — ABO/RH: ABO/RH(D): A POS

## 2015-06-02 LAB — SURGICAL PCR SCREEN
MRSA, PCR: NEGATIVE
Staphylococcus aureus: POSITIVE — AB

## 2015-06-02 NOTE — Progress Notes (Signed)
PCP is Dr Leanna Battles Cardiologist is Dr. Claiborne Billings Cardiac clearance note from 05-05-15 Echo noted from 03-07-12 Stress test noted from 07-31-14

## 2015-06-02 NOTE — Pre-Procedure Instructions (Signed)
    KRIZ CANEDY  06/02/2015      [facility] [address] [place], [place] [address] Phone: [phone] Fax: [phone]    Your procedure is scheduled on Monday, June 09, 2015  Report to [facility] Admitting at 10:30 A.M.  Call this number if you have problems the morning of surgery:  [phone]   Remember:  Do not eat food or drink liquids after midnight.  Take these medicines the morning of surgery with A SIP OF WATER : isosorbide mononitrate (IMDUR), metoprolol succinate (TOPROL XL), if needed: acetaminophen (TYLENOL) for pain, fluticasone (FLONASE) nasal spray for allergies  Stop taking Aspirin, vitamins, fish oil and herbal medications. Do not take any NSAIDs ie: Ibuprofen, Advil, Naproxen or any medication containing Aspirin; stop now.   Do not wear jewelry, make-up or nail polish.  Do not wear lotions, powders, or perfumes.  You may wear deodorant.  Do not shave 48 hours prior to surgery.  Men may shave face and neck.  Do not bring valuables to the hospital.  [facility] is not responsible for any belongings or valuables.  Contacts, dentures or bridgework may not be worn into surgery.  Leave your suitcase in the car.  After surgery it may be brought to your room.  For patients admitted to the hospital, discharge time will be determined by your treatment team.  Patients discharged the day of surgery will not be allowed to drive home.   Name and phone number of your driver:    Special instructions: Shower the night before surgery and the morning of surgery with CHG.  Please read over the following fact sheets that you were given. Pain Booklet, Coughing and Deep Breathing, Blood Transfusion Information, Total Joint Packet, MRSA Information and Surgical Site Infection Prevention

## 2015-06-03 LAB — URINE CULTURE: Culture: 1000

## 2015-06-04 NOTE — Progress Notes (Signed)
Anesthesia Chart Review: Patient is a 70 year old male scheduled for right TKA on 06/09/15 by Dr. Noemi Chapel.  History includes non-smoker, marked HLD, CAD s/p HepaCoat stent '02 (with "mild concomitant CAD involving the LAD and right coronary artery" treated medically), skin cancer excision. PCP is Dr. Bevelyn Buckles.  Cardiologist is Dr. Claiborne Billings who cleared patient for upcoming surgery (see 05/07/15 telephone encounter by Pugh, LPN.)  Meds include Lipitor, Zetia, Flonase, Imdur, Toprol XL, Praluent, ramipril.   07/05/14 EKG: SB at 54 bpm.  07/31/14 Nuclear stress test: Overall Impression: Low risk stress nuclear study with small, fixed basal septal artifact. No reversible ischemia. LV Wall Motion: NL LV Function; NL Wall Motion: LVEF 60%.  03/07/12 Echo: LVEF > 55%, normal LV wall thickness. Normal diastolic function. Top normal LA size. Trace AI. Mild MR. Mild-moderate TR. RVSP 30 mmHg.  Preoperative labs noted.   If no acute changes then I anticipate that he can proceed as planned.  [facility] Short Stay Center/Anesthesiology Phone [phone] 06/04/2015 2:40 PM

## 2015-06-06 MED ORDER — POVIDONE-IODINE 7.5 % EX SOLN
Freq: Once | CUTANEOUS | Status: DC
  Filled 2015-06-06: qty 118

## 2015-06-06 MED ORDER — CHLORHEXIDINE GLUCONATE 4 % EX LIQD: 60.0000 mL | Freq: Once | CUTANEOUS | Status: DC

## 2015-06-06 MED ORDER — CEFAZOLIN SODIUM-DEXTROSE 2-3 GM-% IV SOLR
2.0000 g | INTRAVENOUS | Status: AC
  Administered 2015-06-09: 2 g via INTRAVENOUS
  Filled 2015-06-06: qty 50

## 2015-06-06 MED ORDER — LACTATED RINGERS IV SOLN
INTRAVENOUS | Status: DC
  Administered 2015-06-09 (×3): via INTRAVENOUS

## 2015-06-09 ENCOUNTER — Inpatient Hospital Stay (HOSPITAL_COMMUNITY)
Admission: AD | Admit: 2015-06-09 | Discharge: 2015-06-11 | DRG: 470 | Disposition: A | Discharge status: Home / Self Care | Payer: 59 | Source: Ambulatory Visit | Attending: Orthopedic Surgery | Admitting: Orthopedic Surgery

## 2015-06-09 ENCOUNTER — Encounter (HOSPITAL_COMMUNITY): Payer: Self-pay | Admitting: Anesthesiology

## 2015-06-09 ENCOUNTER — Inpatient Hospital Stay (HOSPITAL_COMMUNITY): Payer: 59 | Admitting: Vascular Surgery

## 2015-06-09 ENCOUNTER — Encounter (HOSPITAL_COMMUNITY)
Admission: AD | Disposition: A | Discharge status: Home / Self Care | Payer: Self-pay | Source: Ambulatory Visit | Attending: Orthopedic Surgery

## 2015-06-09 DIAGNOSIS — C439 Malignant melanoma of skin, unspecified: Secondary | ICD-10-CM | POA: Diagnosis present

## 2015-06-09 DIAGNOSIS — I2510 Atherosclerotic heart disease of native coronary artery without angina pectoris: Secondary | ICD-10-CM | POA: Diagnosis present

## 2015-06-09 DIAGNOSIS — E785 Hyperlipidemia, unspecified: Secondary | ICD-10-CM | POA: Diagnosis present

## 2015-06-09 DIAGNOSIS — Z955 Presence of coronary angioplasty implant and graft: Secondary | ICD-10-CM

## 2015-06-09 DIAGNOSIS — M1711 Unilateral primary osteoarthritis, right knee: Secondary | ICD-10-CM | POA: Diagnosis present

## 2015-06-09 DIAGNOSIS — M1710 Unilateral primary osteoarthritis, unspecified knee: Secondary | ICD-10-CM | POA: Diagnosis present

## 2015-06-09 DIAGNOSIS — M25561 Pain in right knee: Secondary | ICD-10-CM | POA: Diagnosis present

## 2015-06-09 DIAGNOSIS — I10 Essential (primary) hypertension: Secondary | ICD-10-CM | POA: Diagnosis present

## 2015-06-09 DIAGNOSIS — Z22321 Carrier or suspected carrier of Methicillin susceptible Staphylococcus aureus: Secondary | ICD-10-CM | POA: Diagnosis not present

## 2015-06-09 DIAGNOSIS — M179 Osteoarthritis of knee, unspecified: Secondary | ICD-10-CM | POA: Diagnosis present

## 2015-06-09 HISTORY — DX: Unilateral primary osteoarthritis, right knee: M17.11

## 2015-06-09 HISTORY — PX: TOTAL KNEE ARTHROPLASTY: SHX125

## 2015-06-09 LAB — CREATININE, SERUM
Creatinine, Ser: 0.81 mg/dL (ref 0.61–1.24)
GFR calc Af Amer: >60 mL/min (ref >60)

## 2015-06-09 LAB — CBC
HEMATOCRIT: 40 % (ref 39.0–52.0)
HEMOGLOBIN: 13.1 g/dL (ref 13.0–17.0)
MCH: 30.3 pg (ref 26.0–34.0)
MCHC: 32.8 g/dL (ref 30.0–36.0)
MCV: 92.6 fL (ref 78.0–100.0)
Platelets: 173 10*3/uL (ref 150–400)
RBC: 4.32 MIL/uL (ref 4.22–5.81)
RDW: 12.6 % (ref 11.5–15.5)
WBC: 9.5 10*3/uL (ref 4.0–10.5)

## 2015-06-09 SURGERY — ARTHROPLASTY, KNEE, TOTAL
Anesthesia: Spinal | Laterality: Right

## 2015-06-09 MED ORDER — DEXAMETHASONE SODIUM PHOSPHATE 10 MG/ML IJ SOLN
10.0000 mg | Freq: Three times a day (TID) | INTRAMUSCULAR | Status: AC
  Administered 2015-06-09 – 2015-06-10 (×4): 10 mg via INTRAVENOUS
  Filled 2015-06-09 (×4): qty 1

## 2015-06-09 MED ORDER — DEXAMETHASONE SODIUM PHOSPHATE 10 MG/ML IJ SOLN
INTRAMUSCULAR | Status: DC | PRN
  Administered 2015-06-09: 10 mg via INTRAVENOUS

## 2015-06-09 MED ORDER — CELECOXIB 200 MG PO CAPS
200.0000 mg | ORAL_CAPSULE | Freq: Two times a day (BID) | ORAL | Status: DC
  Administered 2015-06-09 – 2015-06-11 (×4): 200 mg via ORAL
  Filled 2015-06-09 (×4): qty 1

## 2015-06-09 MED ORDER — DIPHENHYDRAMINE HCL 12.5 MG/5ML PO ELIX: 12.5000 mg | ORAL_SOLUTION | ORAL | Status: DC | PRN

## 2015-06-09 MED ORDER — PHENOL 1.4 % MT LIQD: 1.0000 | OROMUCOSAL | Status: DC | PRN

## 2015-06-09 MED ORDER — HYDROMORPHONE HCL 1 MG/ML IJ SOLN
INTRAMUSCULAR | Status: AC
  Administered 2015-06-09: 0.5 mg via INTRAVENOUS
  Filled 2015-06-09: qty 1

## 2015-06-09 MED ORDER — DOCUSATE SODIUM 100 MG PO CAPS
100.0000 mg | ORAL_CAPSULE | Freq: Two times a day (BID) | ORAL | Status: DC
  Administered 2015-06-10 – 2015-06-11 (×3): 100 mg via ORAL
  Filled 2015-06-09 (×3): qty 1

## 2015-06-09 MED ORDER — FLUTICASONE PROPIONATE 50 MCG/ACT NA SUSP
1.0000 | NASAL | Status: DC | PRN
  Filled 2015-06-09: qty 16

## 2015-06-09 MED ORDER — CEFUROXIME SODIUM 750 MG IJ SOLR
INTRAMUSCULAR | Status: AC
  Filled 2015-06-09: qty 1500

## 2015-06-09 MED ORDER — PHENYLEPHRINE HCL 10 MG/ML IJ SOLN
INTRAMUSCULAR | Status: DC | PRN
  Administered 2015-06-09 (×6): 40 ug via INTRAVENOUS

## 2015-06-09 MED ORDER — ENOXAPARIN SODIUM 30 MG/0.3ML ~~LOC~~ SOLN
30.0000 mg | Freq: Two times a day (BID) | SUBCUTANEOUS | Status: DC
  Administered 2015-06-10 – 2015-06-11 (×3): 30 mg via SUBCUTANEOUS
  Filled 2015-06-09 (×3): qty 0.3

## 2015-06-09 MED ORDER — ONDANSETRON HCL 4 MG/2ML IJ SOLN
4.0000 mg | Freq: Four times a day (QID) | INTRAMUSCULAR | Status: DC | PRN
  Administered 2015-06-11: 4 mg via INTRAVENOUS
  Filled 2015-06-09: qty 2

## 2015-06-09 MED ORDER — ISOSORBIDE MONONITRATE ER 30 MG PO TB24
30.0000 mg | ORAL_TABLET | Freq: Every day | ORAL | Status: DC
  Administered 2015-06-10 – 2015-06-11 (×2): 30 mg via ORAL
  Filled 2015-06-09 (×2): qty 1

## 2015-06-09 MED ORDER — ACETAMINOPHEN 650 MG RE SUPP: 650.0000 mg | Freq: Four times a day (QID) | RECTAL | Status: DC | PRN

## 2015-06-09 MED ORDER — LIDOCAINE HCL (CARDIAC) 20 MG/ML IV SOLN
INTRAVENOUS | Status: AC
  Filled 2015-06-09: qty 5

## 2015-06-09 MED ORDER — SODIUM CHLORIDE 0.9 % IJ SOLN
INTRAMUSCULAR | Status: AC
  Filled 2015-06-09: qty 10

## 2015-06-09 MED ORDER — FENTANYL CITRATE (PF) 100 MCG/2ML IJ SOLN
50.0000 ug | Freq: Once | INTRAMUSCULAR | Status: AC
  Administered 2015-06-09: 50 ug via INTRAVENOUS

## 2015-06-09 MED ORDER — BUPIVACAINE-EPINEPHRINE (PF) 0.25% -1:200000 IJ SOLN
INTRAMUSCULAR | Status: AC
  Filled 2015-06-09: qty 30

## 2015-06-09 MED ORDER — POTASSIUM CHLORIDE IN NACL 20-0.9 MEQ/L-% IV SOLN
INTRAVENOUS | Status: DC
  Administered 2015-06-09: 18:00:00 via INTRAVENOUS
  Filled 2015-06-09: qty 1000

## 2015-06-09 MED ORDER — HYDROMORPHONE HCL 1 MG/ML IJ SOLN
1.0000 mg | INTRAMUSCULAR | Status: DC | PRN
  Administered 2015-06-09 – 2015-06-11 (×3): 1 mg via INTRAVENOUS
  Filled 2015-06-09 (×3): qty 1

## 2015-06-09 MED ORDER — METOPROLOL SUCCINATE ER 25 MG PO TB24
25.0000 mg | ORAL_TABLET | Freq: Every day | ORAL | Status: DC
  Administered 2015-06-09 – 2015-06-11 (×3): 25 mg via ORAL
  Filled 2015-06-09 (×3): qty 1

## 2015-06-09 MED ORDER — ATORVASTATIN CALCIUM 80 MG PO TABS
80.0000 mg | ORAL_TABLET | Freq: Every day | ORAL | Status: DC
  Administered 2015-06-10 – 2015-06-11 (×2): 80 mg via ORAL
  Filled 2015-06-09 (×2): qty 1

## 2015-06-09 MED ORDER — RAMIPRIL 10 MG PO CAPS
10.0000 mg | ORAL_CAPSULE | Freq: Every day | ORAL | Status: DC
  Administered 2015-06-11: 10 mg via ORAL
  Filled 2015-06-09: qty 1

## 2015-06-09 MED ORDER — MIDAZOLAM HCL 2 MG/2ML IJ SOLN
INTRAMUSCULAR | Status: AC
  Filled 2015-06-09: qty 2

## 2015-06-09 MED ORDER — EPHEDRINE SULFATE 50 MG/ML IJ SOLN
INTRAMUSCULAR | Status: AC
  Filled 2015-06-09: qty 1

## 2015-06-09 MED ORDER — MENTHOL 3 MG MT LOZG: 1.0000 | LOZENGE | OROMUCOSAL | Status: DC | PRN

## 2015-06-09 MED ORDER — ONDANSETRON HCL 4 MG/2ML IJ SOLN
INTRAMUSCULAR | Status: AC
  Filled 2015-06-09: qty 2

## 2015-06-09 MED ORDER — FENTANYL CITRATE (PF) 250 MCG/5ML IJ SOLN
INTRAMUSCULAR | Status: AC
  Filled 2015-06-09: qty 5

## 2015-06-09 MED ORDER — ROPIVACAINE HCL 5 MG/ML IJ SOLN
INTRAMUSCULAR | Status: DC | PRN
  Administered 2015-06-09: 30 mL via PERINEURAL

## 2015-06-09 MED ORDER — CEFAZOLIN SODIUM-DEXTROSE 2-3 GM-% IV SOLR
2.0000 g | Freq: Four times a day (QID) | INTRAVENOUS | Status: AC
Start: 2015-06-09 — End: 2015-06-10
  Administered 2015-06-09 (×2): 2 g via INTRAVENOUS
  Filled 2015-06-09 (×2): qty 50

## 2015-06-09 MED ORDER — DEXAMETHASONE SODIUM PHOSPHATE 10 MG/ML IJ SOLN
INTRAMUSCULAR | Status: AC
  Filled 2015-06-09: qty 1

## 2015-06-09 MED ORDER — FENTANYL CITRATE (PF) 100 MCG/2ML IJ SOLN
INTRAMUSCULAR | Status: DC | PRN
  Administered 2015-06-09: 50 ug via INTRAVENOUS

## 2015-06-09 MED ORDER — CEFUROXIME SODIUM 1.5 G IJ SOLR
INTRAMUSCULAR | Status: DC | PRN
Start: 2015-06-09 — End: 2015-06-09
  Administered 2015-06-09: 1.5 g

## 2015-06-09 MED ORDER — EPHEDRINE SULFATE 50 MG/ML IJ SOLN
INTRAMUSCULAR | Status: DC | PRN
  Administered 2015-06-09: 10 mg via INTRAVENOUS

## 2015-06-09 MED ORDER — SODIUM CHLORIDE 0.9 % IR SOLN
Status: DC | PRN
  Administered 2015-06-09 (×3): 1000 mL

## 2015-06-09 MED ORDER — ALUM & MAG HYDROXIDE-SIMETH 200-200-20 MG/5ML PO SUSP: 30.0000 mL | ORAL | Status: DC | PRN

## 2015-06-09 MED ORDER — ACETAMINOPHEN 325 MG PO TABS
650.0000 mg | ORAL_TABLET | Freq: Four times a day (QID) | ORAL | Status: DC | PRN
  Administered 2015-06-10: 650 mg via ORAL
  Filled 2015-06-09: qty 2

## 2015-06-09 MED ORDER — MIDAZOLAM HCL 2 MG/2ML IJ SOLN
2.0000 mg | Freq: Once | INTRAMUSCULAR | Status: AC
Start: 2015-06-09 — End: 2015-06-09
  Administered 2015-06-09: 2 mg via INTRAVENOUS

## 2015-06-09 MED ORDER — FENTANYL CITRATE (PF) 100 MCG/2ML IJ SOLN
INTRAMUSCULAR | Status: AC
  Filled 2015-06-09: qty 2

## 2015-06-09 MED ORDER — HYDROMORPHONE HCL 1 MG/ML IJ SOLN
0.2500 mg | INTRAMUSCULAR | Status: DC | PRN
  Administered 2015-06-09 (×2): 0.5 mg via INTRAVENOUS

## 2015-06-09 MED ORDER — ONDANSETRON HCL 4 MG PO TABS: 4.0000 mg | ORAL_TABLET | Freq: Four times a day (QID) | ORAL | Status: DC | PRN

## 2015-06-09 MED ORDER — POLYETHYLENE GLYCOL 3350 17 G PO PACK
17.0000 g | PACK | Freq: Two times a day (BID) | ORAL | Status: DC
  Administered 2015-06-10 – 2015-06-11 (×3): 17 g via ORAL
  Filled 2015-06-09 (×3): qty 1

## 2015-06-09 MED ORDER — PROPOFOL 500 MG/50ML IV EMUL
INTRAVENOUS | Status: DC | PRN
  Administered 2015-06-09: 75 ug/kg/min via INTRAVENOUS

## 2015-06-09 MED ORDER — OXYCODONE HCL 5 MG PO TABS
5.0000 mg | ORAL_TABLET | ORAL | Status: DC | PRN
  Administered 2015-06-09 – 2015-06-11 (×11): 10 mg via ORAL
  Filled 2015-06-09 (×12): qty 2

## 2015-06-09 MED ORDER — ONDANSETRON HCL 4 MG/2ML IJ SOLN
INTRAMUSCULAR | Status: DC | PRN
  Administered 2015-06-09: 4 mg via INTRAVENOUS

## 2015-06-09 MED ORDER — METOCLOPRAMIDE HCL 5 MG PO TABS: 5.0000 mg | ORAL_TABLET | Freq: Three times a day (TID) | ORAL | Status: DC | PRN

## 2015-06-09 MED ORDER — BUPIVACAINE-EPINEPHRINE 0.25% -1:200000 IJ SOLN
INTRAMUSCULAR | Status: DC | PRN
  Administered 2015-06-09: 30 mL

## 2015-06-09 MED ORDER — METOCLOPRAMIDE HCL 5 MG/ML IJ SOLN: 5.0000 mg | Freq: Three times a day (TID) | INTRAMUSCULAR | Status: DC | PRN

## 2015-06-09 MED ORDER — EZETIMIBE 10 MG PO TABS
10.0000 mg | ORAL_TABLET | Freq: Every day | ORAL | Status: DC
  Administered 2015-06-10 – 2015-06-11 (×2): 10 mg via ORAL
  Filled 2015-06-09 (×2): qty 1

## 2015-06-09 MED ORDER — MIDAZOLAM HCL 5 MG/5ML IJ SOLN
INTRAMUSCULAR | Status: DC | PRN
Start: 2015-06-09 — End: 2015-06-09
  Administered 2015-06-09: 2 mg via INTRAVENOUS

## 2015-06-09 MED ORDER — PROMETHAZINE HCL 25 MG/ML IJ SOLN: 6.2500 mg | INTRAMUSCULAR | Status: DC | PRN

## 2015-06-09 SURGICAL SUPPLY — 70 items
APL SKNCLS STERI-STRIP NONHPOA (GAUZE/BANDAGES/DRESSINGS) ×1
BANDAGE ESMARK 6X9 LF (GAUZE/BANDAGES/DRESSINGS) ×1 IMPLANT
BENZOIN TINCTURE PRP APPL 2/3 (GAUZE/BANDAGES/DRESSINGS) ×2 IMPLANT
BLADE SAGITTAL 25.0X1.19X90 (BLADE) ×1 IMPLANT
BLADE SAGITTAL 25.0X1.19X90MM (BLADE) ×1
BLADE SAW RECIP 87.9 MT (BLADE) ×2 IMPLANT
BLADE SAW SAG 90X13X1.27 (BLADE) ×2 IMPLANT
BLADE SURG 10 STRL SS (BLADE) ×6 IMPLANT
BNDG CMPR 9X6 STRL LF SNTH (GAUZE/BANDAGES/DRESSINGS) ×1
BNDG CMPR MED 15X6 ELC VLCR LF (GAUZE/BANDAGES/DRESSINGS) ×1
BNDG ELASTIC 6X15 VLCR STRL LF (GAUZE/BANDAGES/DRESSINGS) ×3 IMPLANT
BNDG ESMARK 6X9 LF (GAUZE/BANDAGES/DRESSINGS) ×3
BOWL SMART MIX CTS (DISPOSABLE) ×3 IMPLANT
CAPT KNEE TOTAL 3 ATTUNE ×2 IMPLANT
CEMENT HV SMART SET (Cement) ×6 IMPLANT
CLOSURE WOUND 1/2 X4 (GAUZE/BANDAGES/DRESSINGS) ×1
COVER SURGICAL LIGHT HANDLE (MISCELLANEOUS) ×3 IMPLANT
CUFF TOURNIQUET SINGLE 34IN LL (TOURNIQUET CUFF) ×3 IMPLANT
DECANTER SPIKE VIAL GLASS SM (MISCELLANEOUS) ×3 IMPLANT
DRAPE EXTREMITY T 121X128X90 (DRAPE) ×3 IMPLANT
DRAPE INCISE IOBAN 66X45 STRL (DRAPES) ×3 IMPLANT
DRAPE PROXIMA HALF (DRAPES) ×3 IMPLANT
DRAPE U-SHAPE 47X51 STRL (DRAPES) ×3 IMPLANT
DRSG AQUACEL AG ADV 3.5X14 (GAUZE/BANDAGES/DRESSINGS) ×2 IMPLANT
DURAPREP 26ML APPLICATOR (WOUND CARE) ×6 IMPLANT
ELECT CAUTERY BLADE 6.4 (BLADE) ×3 IMPLANT
ELECT REM PT RETURN 9FT ADLT (ELECTROSURGICAL) ×3
ELECTRODE REM PT RTRN 9FT ADLT (ELECTROSURGICAL) ×1 IMPLANT
FACESHIELD WRAPAROUND (MASK) ×9 IMPLANT
FACESHIELD WRAPAROUND OR TEAM (MASK) ×1 IMPLANT
GLOVE BIO SURGEON STRL SZ7 (GLOVE) ×3 IMPLANT
GLOVE BIOGEL PI IND STRL 7.0 (GLOVE) ×1 IMPLANT
GLOVE BIOGEL PI IND STRL 7.5 (GLOVE) ×1 IMPLANT
GLOVE BIOGEL PI INDICATOR 7.0 (GLOVE) ×2
GLOVE BIOGEL PI INDICATOR 7.5 (GLOVE) ×4
GLOVE SS BIOGEL STRL SZ 7.5 (GLOVE) ×1 IMPLANT
GLOVE SUPERSENSE BIOGEL SZ 7.5 (GLOVE) ×2
GOWN STRL REUS W/ TWL LRG LVL3 (GOWN DISPOSABLE) ×1 IMPLANT
GOWN STRL REUS W/ TWL XL LVL3 (GOWN DISPOSABLE) ×2 IMPLANT
GOWN STRL REUS W/TWL LRG LVL3 (GOWN DISPOSABLE) ×3
GOWN STRL REUS W/TWL XL LVL3 (GOWN DISPOSABLE) ×6
HANDPIECE INTERPULSE COAX TIP (DISPOSABLE) ×3
IMMOBILIZER KNEE 22 UNIV (SOFTGOODS) ×2 IMPLANT
KIT BASIN OR (CUSTOM PROCEDURE TRAY) ×3 IMPLANT
KIT ROOM TURNOVER OR (KITS) ×3 IMPLANT
MANIFOLD NEPTUNE II (INSTRUMENTS) ×3 IMPLANT
MARKER SKIN DUAL TIP RULER LAB (MISCELLANEOUS) ×3 IMPLANT
NDL 18GX1X1/2 (RX/OR ONLY) (NEEDLE) ×1 IMPLANT
NEEDLE 18GX1X1/2 (RX/OR ONLY) (NEEDLE) ×3 IMPLANT
NS IRRIG 1000ML POUR BTL (IV SOLUTION) ×3 IMPLANT
PACK TOTAL JOINT (CUSTOM PROCEDURE TRAY) ×3 IMPLANT
PACK UNIVERSAL I (CUSTOM PROCEDURE TRAY) ×3 IMPLANT
PAD ARMBOARD 7.5X6 YLW CONV (MISCELLANEOUS) ×6 IMPLANT
SET HNDPC FAN SPRY TIP SCT (DISPOSABLE) ×1 IMPLANT
STRIP CLOSURE SKIN 1/2X4 (GAUZE/BANDAGES/DRESSINGS) ×1 IMPLANT
SUCTION FRAZIER TIP 10 FR DISP (SUCTIONS) ×3 IMPLANT
SUT MNCRL AB 3-0 PS2 18 (SUTURE) ×3 IMPLANT
SUT VIC AB 0 CT1 27 (SUTURE) ×6
SUT VIC AB 0 CT1 27XBRD ANBCTR (SUTURE) ×2 IMPLANT
SUT VIC AB 1 CT1 27 (SUTURE) ×3
SUT VIC AB 1 CT1 27XBRD ANBCTR (SUTURE) ×1 IMPLANT
SUT VIC AB 2-0 CT1 27 (SUTURE) ×6
SUT VIC AB 2-0 CT1 TAPERPNT 27 (SUTURE) ×2 IMPLANT
SYR 30ML SLIP (SYRINGE) ×3 IMPLANT
TOWEL OR 17X24 6PK STRL BLUE (TOWEL DISPOSABLE) ×3 IMPLANT
TOWEL OR 17X26 10 PK STRL BLUE (TOWEL DISPOSABLE) ×3 IMPLANT
TRAY FOLEY CATH 16FR SILVER (SET/KITS/TRAYS/PACK) ×3 IMPLANT
TUBE CONNECTING 12'X1/4 (SUCTIONS) ×1
TUBE CONNECTING 12X1/4 (SUCTIONS) ×2 IMPLANT
YANKAUER SUCT BULB TIP NO VENT (SUCTIONS) ×3 IMPLANT

## 2015-06-09 NOTE — Anesthesia Procedure Notes (Addendum)
Spinal Patient location during procedure: OR Staffing Performed by: anesthesiologist  Preanesthetic Checklist Completed: patient identified, site marked, surgical consent, pre-op evaluation, timeout performed, IV checked, risks and benefits discussed and monitors and equipment checked Spinal Block Patient position: sitting Prep: Betadine Patient monitoring: heart rate, continuous pulse ox and blood pressure Injection technique: single-shot Needle Needle type: Sprotte  Needle gauge: 24 G Needle length: 9 cm Additional Notes Expiration date of kit checked and confirmed. Patient tolerated procedure well, without complications.    Anesthesia Regional Block:  Femoral nerve block  Pre-Anesthetic Checklist: ,, timeout performed, Correct Patient, Correct Site, Correct Laterality, Correct Procedure, Correct Position, site marked, Risks and benefits discussed,  Surgical consent,  Pre-op evaluation,  At surgeon's request and post-op pain management  Laterality: Right  Prep: chloraprep       Needles:  Injection technique: Single-shot  Needle Type: Echogenic Stimulator Needle     Needle Length: 9cm 9 cm Needle Gauge: 21 and 21 G    Additional Needles:  Procedures: ultrasound guided (picture in chart) Femoral nerve block Narrative:  Injection made incrementally with aspirations every 5 mL.  Performed by: Personally   Additional Notes: Patient tolerated the procedure well without complications   Procedure Name: MAC Date/Time: 06/09/2015 12:10 PM Performed by: Jenne Campus Pre-anesthesia Checklist: Patient identified, Emergency Drugs available, Suction available, Patient being monitored and Timeout performed Patient Re-evaluated:Patient Re-evaluated prior to inductionOxygen Delivery Method: Simple face mask

## 2015-06-09 NOTE — Op Note (Signed)
MRN:     [id number] DOB/AGE:    69-Mar-1947 / 69 y.o.       OPERATIVE REPORT    DATE OF PROCEDURE:  06/09/2015       PREOPERATIVE DIAGNOSIS:   PRIMARY LOCALIZED OA RIGHT KNEE      Estimated body mass index is 28.71 kg/(m^2) as calculated from the following:   Height as of this encounter: 5\' 10"  (1.778 m).   Weight as of this encounter: 90.765 kg (200 lb 1.6 oz).                                                        POSTOPERATIVE DIAGNOSIS:    SAME                                                                     PROCEDURE:  Procedure(s): RIGHT TOTAL KNEE ARTHROPLASTY Using Depuy Attune RP implants #8 Femur, #9Tibia, 62mm  RP bearing, 38 Patella     SURGEON: Yulianna Folse A    ASSISTANT:  Kirstin Shepperson PA-C   (Present and scrubbed throughout the case, critical for assistance with exposure, retraction, instrumentation, and closure.)         ANESTHESIA: Spinal with Adductor Nerve Block     TOURNIQUET TIME: [id number]   COMPLICATIONS:  None     SPECIMENS: None   INDICATIONS FOR PROCEDURE: The patient has  DJD RIGHT KNEE, varus deformities, XR shows bone on bone arthritis. Patient has failed all conservative measures including anti-inflammatory medicines, narcotics, attempts at  exercise and weight loss, cortisone injections and viscosupplementation.  Risks and benefits of surgery have been discussed, questions answered.   DESCRIPTION OF PROCEDURE: The patient identified by armband, received  right femoral nerve block and IV antibiotics, in the holding area at [facility] Patient taken to the operating room, appropriate anesthetic  monitors were attached General endotracheal anesthesia induced with  the patient in supine position, Foley catheter was inserted. Tourniquet  applied high to the operative thigh. Lateral post and foot positioner  applied to the table, the lower extremity was then prepped and draped  in usual sterile fashion from the ankle to the tourniquet. Time-out  procedure was performed. The limb was wrapped with an Esmarch bandage and the tourniquet inflated to 365 mmHg. We began the operation by making the anterior midline incision starting at handbreadth above the patella going over the patella 1 cm medial to and  4 cm distal to the tibial tubercle. Small bleeders in the skin and the  subcutaneous tissue identified and cauterized. Transverse retinaculum was incised and reflected medially and a medial parapatellar arthrotomy was accomplished. the patella was everted and theprepatellar fat pad resected. The superficial medial collateral  ligament was then elevated from anterior to posterior along the proximal  flare of the tibia and anterior half of the menisci resected. The knee was hyperflexed exposing bone on bone arthritis. Peripheral and notch osteophytes as well as the cruciate ligaments were then resected. We continued to  work our way around posteriorly along the proximal tibia, and externally  rotated the  tibia subluxing it out from underneath the femur. A McHale  retractor was placed through the notch and a lateral Hohmann retractor  placed, and we then drilled through the proximal tibia in line with the  axis of the tibia followed by an intramedullary guide rod and 2-degree  posterior slope cutting guide. The tibial cutting guide was pinned into place  allowing resection of 4 mm of bone medially and about 6 mm of bone  laterally because of her varus deformity. Satisfied with the tibial resection, we then  entered the distal femur 2 mm anterior to the PCL origin with the  intramedullary guide rod and applied the distal femoral cutting guide  set at 48mm, with 5 degrees of valgus. This was pinned along the  epicondylar axis. At this point, the distal femoral cut was accomplished without difficulty. We then sized for a #6 femoral component and pinned the guide in 3 degrees of external rotation.The chamfer cutting guide was pinned into place. The  anterior, posterior, and chamfer cuts were accomplished without difficulty followed by  the  RP box cutting guide and the box cut. We also removed posterior osteophytes from the posterior femoral condyles. At this  time, the knee was brought into full extension. We checked our  extension and flexion gaps and found them symmetric at 24mm.  The patella thickness measured at 25 mm. We set the cutting guide at 15 and removed the posterior 9.5-10 mm  of the patella sized for 38 button and drilled the lollipop. The knee  was then once again hyperflexed exposing the proximal tibia. We sized for a #7 tibial base plate, applied the smokestack and the conical reamer followed by the the Delta fin keel punch. We then hammered into place the  RP trial femoral component, inserted a 1 trial bearing, trial patellar button, and took the knee through range of motion from 0-130 degrees. No thumb pressure was required for patellar  tracking. At this point, all trial components were removed, a double batch of DePuy HV cement with 1500 mg of Zinacef was mixed and applied to all bony metallic mating surfaces except for the posterior condyles of the femur itself. In order, we  hammered into place the tibial tray and removed excess cement, the femoral component and removed excess cement, a 74mm  RP bearing  was inserted, and the knee brought to full extension with compression.  The patellar button was clamped into place, and excess cement  removed. While the cement cured the wound was irrigated out with normal saline solution pulse lavage.. Ligament stability and patellar tracking were checked and found to be excellent.. The parapatellar arthrotomy was closed with  #1 Vicryl suture. The subcutaneous tissue with 0 and 2-0 undyed  Vicryl suture, and 4-0 Monocryl.. A dressing of Aquaseal,  4 x 4, dressing sponges, Webril, and Ace wrap applied. Needle and sponge count were correct times 2.The patient awakened, extubated, and taken to  recovery room without difficulty. Vascular status was normal, pulses 2+ and symmetric.   Lajuane Leatham A 06/09/2015, 1:28 PM

## 2015-06-09 NOTE — Progress Notes (Signed)
Orthopedic Tech Progress Note Patient Details:  Henry Morrison [birth year] [id number]  CPM Right Knee CPM Right Knee: On Right Knee Flexion (Degrees): 90 Right Knee Extension (Degrees): 0 Additional Comments: Trapeze bar   [place] Pink 06/09/2015, 2:41 PM

## 2015-06-09 NOTE — Transfer of Care (Signed)
Immediate Anesthesia Transfer of Care Note  Patient: Henry Morrison  Procedure(s) Performed: Procedure(s): RIGHT TOTAL KNEE ARTHROPLASTY (Right)  Patient Location: PACU  Anesthesia Type:MAC and Spinal  Level of Consciousness: awake, alert , oriented and patient cooperative  Airway & Oxygen Therapy: Patient Spontanous Breathing  Post-op Assessment: Report given to RN and Post -op Vital signs reviewed and stable  Post vital signs: Reviewed  Last Vitals:  Filed Vitals:   06/09/15 1117 06/09/15 1125  BP: 112/66 108/59  Pulse: 54 52  Temp:    Resp: 13 12    Complications: No apparent anesthesia complications

## 2015-06-09 NOTE — Progress Notes (Signed)
Orthopedic Tech Progress Note Patient Details:  Henry Morrison 1945/07/04 [id number]  Ortho Devices Ortho Device/Splint Location: foot roll Ortho Device/Splint Interventions: Application   [place] Pink 06/09/2015, 3:27 PM

## 2015-06-09 NOTE — Interval H&P Note (Signed)
History and Physical Interval Note:  06/09/2015 11:39 AM  Henry Morrison  has presented today for surgery, with the diagnosis of PRIMARY LOCALIZED OA RIGHT KNEE  The various methods of treatment have been discussed with the patient and family. After consideration of risks, benefits and other options for treatment, the patient has consented to  Procedure(s): TOTAL KNEE ARTHROPLASTY (Right) as a surgical intervention .  The patient's history has been reviewed, patient examined, no change in status, stable for surgery.  I have reviewed the patient's chart and labs.  Questions were answered to the patient's satisfaction.     [name]

## 2015-06-09 NOTE — Anesthesia Preprocedure Evaluation (Signed)
Anesthesia Evaluation  Patient identified by MRN, date of birth, ID band Patient awake    Reviewed: Allergy & Precautions, NPO status , Patient's Chart, lab work & pertinent test results  Airway Mallampati: II  TM Distance: >3 FB Neck ROM: Full    Dental no notable dental hx.    Pulmonary neg pulmonary ROS,    Pulmonary exam normal breath sounds clear to auscultation       Cardiovascular hypertension, Pt. on home beta blockers and Pt. on medications + CAD and + Cardiac Stents  Normal cardiovascular exam Rhythm:Regular Rate:Normal     Neuro/Psych negative neurological ROS  negative psych ROS   GI/Hepatic negative GI ROS, Neg liver ROS,   Endo/Other  negative endocrine ROS  Renal/GU negative Renal ROS  negative genitourinary   Musculoskeletal negative musculoskeletal ROS (+)   Abdominal   Peds negative pediatric ROS (+)  Hematology negative hematology ROS (+)   Anesthesia Other Findings   Reproductive/Obstetrics negative OB ROS                             Anesthesia Physical Anesthesia Plan  ASA: III  Anesthesia Plan: Spinal   Post-op Pain Management:    Induction: Intravenous  Airway Management Planned: Simple Face Mask  Additional Equipment:   Intra-op Plan:   Post-operative Plan:   Informed Consent: I have reviewed the patients History and Physical, chart, labs and discussed the procedure including the risks, benefits and alternatives for the proposed anesthesia with the patient or authorized representative who has indicated his/her understanding and acceptance.   Dental advisory given  Plan Discussed with: CRNA and Surgeon  Anesthesia Plan Comments:         Anesthesia Quick Evaluation

## 2015-06-10 ENCOUNTER — Encounter (HOSPITAL_COMMUNITY): Payer: Self-pay | Admitting: Orthopedic Surgery

## 2015-06-10 LAB — BASIC METABOLIC PANEL
ANION GAP: 13 (ref 5–15)
BUN: 24 mg/dL — ABNORMAL HIGH (ref 6–20)
CO2: 23 mmol/L (ref 22–32)
Calcium: 8.8 mg/dL — ABNORMAL LOW (ref 8.9–10.3)
Chloride: 102 mmol/L (ref 101–111)
Creatinine, Ser: 1.11 mg/dL (ref 0.61–1.24)
GLUCOSE: 234 mg/dL — AB (ref 65–99)
POTASSIUM: 4.5 mmol/L (ref 3.5–5.1)
Sodium: 138 mmol/L (ref 135–145)

## 2015-06-10 LAB — CBC
HCT: 33.8 % — ABNORMAL LOW (ref 39.0–52.0)
Hemoglobin: 11.4 g/dL — ABNORMAL LOW (ref 13.0–17.0)
MCH: 31.1 pg (ref 26.0–34.0)
MCHC: 33.7 g/dL (ref 30.0–36.0)
MCV: 92.3 fL (ref 78.0–100.0)
PLATELETS: 206 10*3/uL (ref 150–400)
RBC: 3.66 MIL/uL — AB (ref 4.22–5.81)
RDW: 12.7 % (ref 11.5–15.5)
WBC: 16.6 10*3/uL — AB (ref 4.0–10.5)

## 2015-06-10 NOTE — Care Management (Signed)
Utilization review completed. Jailyne Chieffo, RN Case Manager [phone]. 

## 2015-06-10 NOTE — Progress Notes (Signed)
Occupational Therapy Evaluation Patient Details Name: GEGORY BALDASSARI MRN: [id number] DOB: February 16, 1946 Today's Date: 06/10/2015    History of Present Illness Pt admitted for R TKA. PMHx: CAD, HTN   Clinical Impression   PTA, pt was independent with all ADLs and functional mobility. Pt currently requires min guard assist for mobility and min assist for LB ADLs which pt's wife is able to provide at home. Pt plans to d/c home with 24/7 assistance from his wife. Reviewed compensatory strategies for LB ADLs and transfers, fall prevention and pain/edema management strategies. Pt will benefit from continued acute OT to increase independence and safety with ADLs and mobility to allow for safe discharge home. No OT follow up recommendations at this time - recommend 3in1.    Follow Up Recommendations  No OT follow up;Supervision/Assistance - 24 hour    Equipment Recommendations  3 in 1 bedside comode    Recommendations for Other Services       Precautions / Restrictions Precautions Precautions: Knee Precaution Booklet Issued: No Required Braces or Orthoses: Knee Immobilizer - Right Knee Immobilizer - Right: On when out of bed or walking;Discontinue once straight leg raise with < 10 degree lag Restrictions Weight Bearing Restrictions: Yes RLE Weight Bearing: Weight bearing as tolerated      Mobility Bed Mobility Overal bed mobility: Modified Independent             General bed mobility comments: HOB flat, no use of bedrails to simulate home environment. No physical assist required - pt able to elevate RLE onto bed  Transfers Overall transfer level: Needs assistance Equipment used: Rolling walker (2 wheeled) Transfers: Sit to/from Stand Sit to Stand: Min guard         General transfer comment: Min guard for safety due to knee buckling. Good demonstration of safe hand placement on seated surfaces    Balance Overall balance assessment: Needs assistance Sitting-balance  support: No upper extremity supported;Feet supported Sitting balance-Leahy Scale: Good     Standing balance support: Bilateral upper extremity supported;During functional activity Standing balance-Leahy Scale: Fair                              ADL Overall ADL's : Needs assistance/impaired     Grooming: Wash/dry hands;Min Dispensing optician: Min guard;Ambulation;BSC;RW Toilet Transfer Details (indicate cue type and reason): BSC over toilet Toileting- Clothing Manipulation and Hygiene: Min guard;Sit to/from stand   Tub/ Shower Transfer: Walk-in shower;Min guard;Cueing for sequencing;Ambulation;Rolling walker Tub/Shower Transfer Details (indicate cue type and reason): verbal cues for proper step sequence Functional mobility during ADLs: Min guard;Rolling walker General ADL Comments: Practiced transfers with appropriate step sequence and use of DME. Pt unable to reach R foot for bathing/dressing tasks, but wife reports she will assist with these tasks. Educated on fall prevention and pain/edema management strategies     Vision Vision Assessment?: No apparent visual deficits   Perception     Praxis      Pertinent Vitals/Pain Pain Assessment: 0-10 Pain Score: 4  Pain Location: R knee Pain Descriptors / Indicators: Aching Pain Intervention(s): Limited activity within patient's tolerance;Monitored during session;Premedicated before session;Repositioned;Ice applied     Hand Dominance Right   Extremity/Trunk Assessment Upper Extremity Assessment Upper Extremity Assessment: Overall WFL for tasks assessed   Lower Extremity Assessment Lower Extremity Assessment: RLE deficits/detail RLE Deficits /  Details: decreased strength and ROM as expected post op with additional limitation due to block still effective   Cervical / Trunk Assessment Cervical / Trunk Assessment: Normal   Communication Communication Communication: No  difficulties   Cognition Arousal/Alertness: Awake/alert Behavior During Therapy: WFL for tasks assessed/performed Overall Cognitive Status: Within Functional Limits for tasks assessed                     General Comments       Exercises       Shoulder Instructions      Home Living Family/patient expects to be discharged to:: Private residence Living Arrangements: Spouse/significant other Available Help at Discharge: Family;Available 24 hours/day Type of Home: House Home Access: Stairs to enter [employer] of Steps: 3   Home Layout: Two level;Bed/bath upstairs Alternate Level Stairs-Number of Steps: 13   Bathroom Shower/Tub: Walk-in shower;Door   [employer] Toilet: Standard Bathroom Accessibility: Yes How Accessible: Accessible via walker Home Equipment: Walker - 2 wheels;Cane - single point;Crutches;Shower seat;Hand held shower head          Prior Functioning/Environment Level of Independence: Independent             OT Diagnosis: Acute pain   OT Problem List: Decreased range of motion;Decreased activity tolerance;Decreased strength;Decreased knowledge of use of DME or AE;Decreased safety awareness;Decreased coordination;Impaired balance (sitting and/or standing);Decreased knowledge of precautions;Pain   OT Treatment/Interventions: Self-care/ADL training;Therapeutic exercise;Energy conservation;DME and/or AE instruction;Therapeutic activities;Patient/family education;Balance training    OT Goals(Current goals can be found in the care plan section) Acute Rehab OT Goals Patient Stated Goal: to go home OT Goal Formulation: With patient Time For Goal Achievement: 06/24/15 Potential to Achieve Goals: Good ADL Goals Pt Will Perform Grooming: with modified independence;standing Pt Will Perform Lower Body Bathing: with supervision;sit to/from stand Pt Will Perform Lower Body Dressing: with min guard assist;with caregiver independent in assisting;sit  to/from stand Pt Will Transfer to Toilet: with modified independence;ambulating;bedside commode Pt Will Perform Toileting - Clothing Manipulation and hygiene: with modified independence;sit to/from stand;sitting/lateral leans Pt Will Perform Tub/Shower Transfer: Shower transfer;with supervision;ambulating;shower seat;rolling walker  OT Frequency: Min 2X/week   Barriers to D/C:            Co-evaluation              End of Session Equipment Utilized During Treatment: Gait belt;Rolling walker;Right knee immobilizer CPM Right Knee CPM Right Knee: On Right Knee Flexion (Degrees): 80 Right Knee Extension (Degrees): 0 Additional Comments: ice applied Nurse Communication: Mobility status;Precautions  Activity Tolerance: Patient tolerated treatment well Patient left: in bed;with call bell/phone within reach;with family/visitor present   Time: [id number] OT Time Calculation (min): 25 min Charges:  OT General Charges $OT Visit: 1 Procedure OT Evaluation $OT Eval Moderate Complexity: 1 Procedure OT Treatments $Self Care/Home Management : 8-22 mins G-Codes:    Redmond Baseman, OTR/L Pager: [phone] 06/10/2015, 3:27 PM

## 2015-06-10 NOTE — Evaluation (Signed)
Physical Therapy Evaluation Patient Details Name: Henry Morrison MRN: [id number] DOB: [birth year] Today's Date: 06/10/2015   History of Present Illness  Pt admitted for R TKA. PMHx: CAD, HTN  Clinical Impression  Pt very pleasant and motivated to return to independent function for home and work. Pt educated for CPM use, bone foam, transfers, gait, HEP and plan. Pt with decreased strength and ROM particularly due to block still in effect with very limited quad activation, decreased transfers and gait who will benefit from acute therapy to maximize mobility, function and strength to decrease burden of care.     Follow Up Recommendations Supervision for mobility/OOB    Equipment Recommendations  3in1 (PT)    Recommendations for Other Services       Precautions / Restrictions Precautions Precautions: Knee Required Braces or Orthoses: Knee Immobilizer - Right Knee Immobilizer - Right: On when out of bed or walking;Discontinue once straight leg raise with < 10 degree lag Restrictions RLE Weight Bearing: Weight bearing as tolerated      Mobility  Bed Mobility Overal bed mobility: Modified Independent                Transfers Overall transfer level: Needs assistance   Transfers: Sit to/from Stand Sit to Stand: Supervision         General transfer comment: cues for hand placement and safety  Ambulation/Gait Ambulation/Gait assistance: Supervision Ambulation Distance (Feet): 120 Feet Assistive device: Rolling walker (2 wheeled) Gait Pattern/deviations: Step-to pattern;Decreased stride length;Decreased stance time - right   Gait velocity interpretation: Below normal speed for age/gender General Gait Details: cues for quad activation RLE , sequence and RW use  Stairs            Wheelchair Mobility    Modified Rankin (Stroke Patients Only)       Balance                                             Pertinent Vitals/Pain Pain Assessment:  No/denies pain    Home Living Family/patient expects to be discharged to:: Private residence Living Arrangements: Spouse/significant other Available Help at Discharge: Family;Available 24 hours/day Type of Home: House Home Access: Stairs to enter   [employer] of Steps: 3 Home Layout: Two level;Bed/bath upstairs Home Equipment: Walker - 2 wheels;Crutches;Cane - single point      Prior Function Level of Independence: Independent               Hand Dominance        Extremity/Trunk Assessment   Upper Extremity Assessment: Overall WFL for tasks assessed           Lower Extremity Assessment: RLE deficits/detail RLE Deficits / Details: decreased strength and ROM as expected post op with additional limitation due to block still effective    Cervical / Trunk Assessment: Normal  Communication   Communication: No difficulties  Cognition Arousal/Alertness: Awake/alert Behavior During Therapy: WFL for tasks assessed/performed Overall Cognitive Status: Within Functional Limits for tasks assessed                      General Comments      Exercises Total Joint Exercises Quad Sets: AROM;Right;10 reps;Supine Short Arc Quad: AAROM;Right;10 reps;Supine Heel Slides: AROM;Right;10 reps;Supine Hip ABduction/ADduction: AROM;Right;10 reps;Supine Straight Leg Raises: PROM;Right;5 reps;Supine      Assessment/Plan    PT Assessment  Patient needs continued PT services  PT Diagnosis Difficulty walking;Generalized weakness   PT Problem List Decreased strength;Decreased range of motion;Decreased activity tolerance;Decreased balance;Decreased knowledge of use of DME;Decreased mobility  PT Treatment Interventions DME instruction;Gait training;Stair training;Functional mobility training;Therapeutic activities;Therapeutic exercise;Patient/family education   PT Goals (Current goals can be found in the Care Plan section) Acute Rehab PT Goals Patient Stated Goal: be  able to play soccer and kneel PT Goal Formulation: With patient Time For Goal Achievement: 06/17/15 Potential to Achieve Goals: Good    Frequency 7X/week   Barriers to discharge        Co-evaluation               End of Session Equipment Utilized During Treatment: Gait belt;Right knee immobilizer Activity Tolerance: Patient tolerated treatment well Patient left: in chair;with call bell/phone within reach;Other (comment) (in bone foam) Nurse Communication: Mobility status         Time: [id number] PT Time Calculation (min) (ACUTE ONLY): 27 min   Charges:   PT Evaluation $PT Eval Moderate Complexity: 1 Procedure PT Treatments $Gait Training: 8-22 mins   PT G Codes:        Melford Aase 06/10/2015, 8:25 AM Elwyn Reach, [place]

## 2015-06-10 NOTE — Anesthesia Postprocedure Evaluation (Signed)
Anesthesia Post Note  Patient: Henry Morrison  Procedure(s) Performed: Procedure(s) (LRB): RIGHT TOTAL KNEE ARTHROPLASTY (Right)  Patient location during evaluation: PACU Anesthesia Type: Spinal Level of consciousness: awake and alert Pain management: pain level controlled Vital Signs Assessment: post-procedure vital signs reviewed and stable Respiratory status: spontaneous breathing, nonlabored ventilation, respiratory function stable and patient connected to nasal cannula oxygen Cardiovascular status: blood pressure returned to baseline and stable Postop Assessment: no signs of nausea or vomiting Anesthetic complications: no    Last Vitals:  Filed Vitals:   06/10/15 0222 06/10/15 0556  BP: 110/73 115/62  Pulse: 76 70  Temp: 36.3 C 37 C  Resp: 20 16    Last Pain:  Filed Vitals:   06/10/15 1217  PainSc: 7                  Zuriel Roskos S

## 2015-06-10 NOTE — Progress Notes (Signed)
Subjective: 1 Day Post-Op Procedure(s) (LRB): RIGHT TOTAL KNEE ARTHROPLASTY (Right) Patient reports pain as 4 on 0-10 scale.    Objective: Vital signs in last 24 hours: Temp:  [97.3 F (36.3 C)-98.6 F (37 C)] 98.6 F (37 C) (01/24 0556) Pulse Rate:  [50-78] 70 (01/24 0556) Resp:  [10-20] 16 (01/24 0556) BP: (108-147)/(58-107) 115/62 mmHg (01/24 0556) SpO2:  [89 %-99 %] 95 % (01/24 0556) Weight:  [90.765 kg (200 lb 1.6 oz)] 90.765 kg (200 lb 1.6 oz) (01/23 1004)  Intake/Output from previous day: 01/23 0701 - 01/24 0700 In: 5100 [P.O.:1600; I.V.:3400; IV Piggyback:100] Out: 2475 [Urine:2400; Blood:75] Intake/Output this shift:     Recent Labs  06/09/15 1713  HGB 13.1    Recent Labs  06/09/15 1713  WBC 9.5  RBC 4.32  HCT 40.0  PLT 173    Recent Labs  06/09/15 1713  CREATININE 0.81   No results for input(s): LABPT, INR in the last 72 hours.  ABD soft Intact pulses distally Incision: dressing C/D/I  Assessment/Plan: 1 Day Post-Op Procedure(s) (LRB): RIGHT TOTAL KNEE ARTHROPLASTY (Right)  Principal Problem:   Primary localized osteoarthritis of right knee Active Problems:   CAD (coronary artery disease)   Essential hypertension   DJD (degenerative joint disease) of knee  Advance diet Up with therapy  Block is still very much working.  Significant quad weakness with buckling in the knee immobilizer.  Will have PT to continue to work with this patient and plan on DC tomorrow  Linda Hedges 06/10/2015, 7:30 AM

## 2015-06-10 NOTE — Progress Notes (Signed)
Physical Therapy Treatment Patient Details Name: Henry Morrison MRN: [id number] DOB: 1946/03/17 Today's Date: 06/10/2015    History of Present Illness Pt admitted for R TKA. PMHx: CAD, HTN    PT Comments    Pt continues to be very pleasant and motivated to move however continues to have difficulty with quad activation as block remains present in RLE. Pt educated for HEP, transfers and gait with CPM use recommended after lunch. Did not attempt stairs this session secondary to continued buckling RLE.   Follow Up Recommendations  Supervision for mobility/OOB;Home health PT     Equipment Recommendations  3in1 (PT)    Recommendations for Other Services       Precautions / Restrictions Precautions Precautions: Knee Required Braces or Orthoses: Knee Immobilizer - Right Knee Immobilizer - Right: On when out of bed or walking;Discontinue once straight leg raise with < 10 degree lag Restrictions Weight Bearing Restrictions: Yes RLE Weight Bearing: Weight bearing as tolerated    Mobility  Bed Mobility Overal bed mobility: Modified Independent             General bed mobility comments: bed flat no rails  Transfers Overall transfer level: Modified independent    Ambulation/Gait Ambulation/Gait assistance: Supervision Ambulation Distance (Feet): 150 Feet Assistive device: Rolling walker (2 wheeled) Gait Pattern/deviations: Step-to pattern;Decreased stride length;Decreased stance time - right   Gait velocity interpretation: Below normal speed for age/gender General Gait Details: cues for quad activation RLE , sequence and RW use, increased buckling with fatigue   Stairs            Wheelchair Mobility    Modified Rankin (Stroke Patients Only)       Balance                                    Cognition Arousal/Alertness: Awake/alert Behavior During Therapy: WFL for tasks assessed/performed Overall Cognitive Status: Within Functional Limits for  tasks assessed                      Exercises Total Joint Exercises Quad Sets: AROM;Right;Supine;15 reps Short Arc QuadSinclair Ship;Right;Supine;5 reps Heel Slides: AROM;Right;Supine;15 reps Hip ABduction/ADduction: AROM;Right;Supine;15 reps Straight Leg Raises: PROM;Right;5 reps;Supine Goniometric ROM: 5-92    General Comments        Pertinent Vitals/Pain Pain Assessment: No/denies pain    Home Living Family/patient expects to be discharged to:: Private residence Living Arrangements: Spouse/significant other Available Help at Discharge: Family;Available 24 hours/day Type of Home: House Home Access: Stairs to enter   Home Layout: Two level;Bed/bath upstairs Home Equipment: Environmental consultant - 2 wheels;Crutches;Cane - single point      Prior Function Level of Independence: Independent          PT Goals (current goals can now be found in the care plan section) Acute Rehab PT Goals Patient Stated Goal: be able to play soccer and kneel PT Goal Formulation: With patient Time For Goal Achievement: 06/17/15 Potential to Achieve Goals: Good Progress towards PT goals: Progressing toward goals    Frequency  7X/week    PT Plan Current plan remains appropriate    Co-evaluation             End of Session Equipment Utilized During Treatment: Right knee immobilizer Activity Tolerance: Patient tolerated treatment well Patient left: in bed;with call bell/phone within reach     Time: [id number] PT Time Calculation (min) (ACUTE ONLY): 20  min  Charges:  $Gait Training: 8-22 mins                    G Codes:      Melford Aase 08-Jul-2015, 11:59 AM Elwyn Reach, [place]

## 2015-06-11 LAB — BASIC METABOLIC PANEL
Anion gap: 13 (ref 5–15)
BUN: 24 mg/dL — ABNORMAL HIGH (ref 6–20)
CALCIUM: 8.1 mg/dL — AB (ref 8.9–10.3)
CHLORIDE: 101 mmol/L (ref 101–111)
CO2: 25 mmol/L (ref 22–32)
CREATININE: 0.97 mg/dL (ref 0.61–1.24)
GFR calc non Af Amer: >60 mL/min (ref >60)
GLUCOSE: 170 mg/dL — AB (ref 65–99)
Potassium: 4.9 mmol/L (ref 3.5–5.1)
Sodium: 139 mmol/L (ref 135–145)

## 2015-06-11 LAB — CBC
HEMATOCRIT: 30.3 % — AB (ref 39.0–52.0)
HEMOGLOBIN: 9.8 g/dL — AB (ref 13.0–17.0)
MCH: 30 pg (ref 26.0–34.0)
MCHC: 32.3 g/dL (ref 30.0–36.0)
MCV: 92.7 fL (ref 78.0–100.0)
Platelets: 201 10*3/uL (ref 150–400)
RBC: 3.27 MIL/uL — ABNORMAL LOW (ref 4.22–5.81)
RDW: 13.1 % (ref 11.5–15.5)
WBC: 15.7 10*3/uL — ABNORMAL HIGH (ref 4.0–10.5)

## 2015-06-11 MED ORDER — ENOXAPARIN SODIUM 30 MG/0.3ML ~~LOC~~ SOLN: 30.0000 mg | Freq: Two times a day (BID) | SUBCUTANEOUS | Status: DC

## 2015-06-11 MED ORDER — DOCUSATE SODIUM 100 MG PO CAPS: ORAL_CAPSULE | ORAL | Status: DC

## 2015-06-11 MED ORDER — OXYCODONE HCL ER 20 MG PO T12A: EXTENDED_RELEASE_TABLET | ORAL | Status: DC

## 2015-06-11 MED ORDER — POLYETHYLENE GLYCOL 3350 17 G PO PACK: PACK | ORAL | Status: DC

## 2015-06-11 MED ORDER — OXYCODONE HCL 5 MG PO TABS: ORAL_TABLET | ORAL | Status: DC

## 2015-06-11 MED ORDER — CELECOXIB 200 MG PO CAPS: ORAL_CAPSULE | ORAL | Status: DC

## 2015-06-11 MED ORDER — METOCLOPRAMIDE HCL 5 MG PO TABS: ORAL_TABLET | ORAL | Status: DC

## 2015-06-11 MED ORDER — OXYCODONE HCL ER 10 MG PO T12A: 20.0000 mg | EXTENDED_RELEASE_TABLET | Freq: Two times a day (BID) | ORAL | Status: DC

## 2015-06-11 NOTE — Discharge Summary (Signed)
Patient ID: Henry Morrison MRN: [id number] DOB/AGE: 11-23-1945 66 y.o.  Admit date: 06/09/2015 Discharge date: 06/11/2015  Admission Diagnoses:  Principal Problem:   Primary localized osteoarthritis of right knee Active Problems:   CAD (coronary artery disease)   Essential hypertension   DJD (degenerative joint disease) of knee   Discharge Diagnoses:  Same  Past Medical History  Diagnosis Date  . Hyperlipidemia   . CAD (coronary artery disease)   . Arthritis   . Cancer ([place])     skin cancer   . Primary localized osteoarthritis of right knee 06/09/2015    Surgeries: Procedure(s): RIGHT TOTAL KNEE ARTHROPLASTY on 06/09/2015   Consultants:    Discharged Condition: Improved  Hospital Course: Henry Morrison is an 70 y.o. male who was admitted 06/09/2015 for operative treatment ofPrimary localized osteoarthritis of right knee. Patient has severe unremitting pain that affects sleep, daily activities, and work/hobbies. After pre-op clearance the patient was taken to the operating room on 06/09/2015 and underwent  Procedure(s): RIGHT TOTAL KNEE ARTHROPLASTY.    Patient was given perioperative antibiotics: Anti-infectives    Start     Dose/Rate Route Frequency Ordered Stop   06/09/15 1700  ceFAZolin (ANCEF) IVPB 2 g/50 mL premix     2 g 100 mL/hr over 30 Minutes Intravenous Every 6 hours 06/09/15 1647 06/10/15 0028   06/09/15 1217  cefUROXime (ZINACEF) injection  Status:  Discontinued       As needed 06/09/15 1217 06/09/15 1400   06/09/15 1100  ceFAZolin (ANCEF) IVPB 2 g/50 mL premix     2 g 100 mL/hr over 30 Minutes Intravenous To ShortStay Surgical 06/06/15 1622 06/09/15 1152       Patient was given sequential compression devices, early ambulation, and chemoprophylaxis to prevent DVT.  Patient benefited maximally from hospital stay and there were no complications.    Recent vital signs: Patient Vitals for the past 24 hrs:  BP Temp Temp src Pulse Resp SpO2  06/11/15 1015  125/68 mmHg - - - - -  06/11/15 0505 125/63 mmHg 99.1 F (37.3 C) Oral 62 18 95 %  06/10/15 2125 113/64 mmHg 98.2 F (36.8 C) Oral 75 18 95 %  06/10/15 1456 130/73 mmHg 98.2 F (36.8 C) - 71 18 98 %     Recent laboratory studies:  Recent Labs  06/10/15 0845 06/11/15 0500  WBC 16.6* 15.7*  HGB 11.4* 9.8*  HCT 33.8* 30.3*  PLT 206 201  NA 138 139  K 4.5 4.9  CL 102 101  CO2 23 25  BUN 24* 24*  CREATININE 1.11 0.97  GLUCOSE 234* 170*  CALCIUM 8.8* 8.1*     Discharge Medications:     Medication List    TAKE these medications        acetaminophen 500 MG tablet  Commonly known as:  TYLENOL  Take 1,000 mg by mouth daily as needed for moderate pain.     atorvastatin 80 MG tablet  Commonly known as:  LIPITOR  Take 1 tablet (80 mg total) by mouth daily.     celecoxib 200 MG capsule  Commonly known as:  CELEBREX  1 tab po q day with food for pain and  swelling     docusate sodium 100 MG capsule  Commonly known as:  COLACE  1 tab 2 times a day while on narcotics.  STOOL SOFTENER     enoxaparin 30 MG/0.3ML injection  Commonly known as:  LOVENOX  Inject 0.3 mLs (30 mg total)  into the skin every 12 (twelve) hours.     ezetimibe 10 MG tablet  Commonly known as:  ZETIA  Take 1 tablet (10 mg total) by mouth daily.     fluticasone 50 MCG/ACT nasal spray  Commonly known as:  FLONASE  Place 1 spray into both nostrils as needed for allergies.     isosorbide mononitrate 30 MG 24 hr tablet  Commonly known as:  IMDUR  Take 1 tablet (30 mg total) by mouth daily.     metoCLOPramide 5 MG tablet  Commonly known as:  REGLAN  1-2 tablets every 8 hrs as needed for hiccoughs or nausea     metoprolol succinate 25 MG 24 hr tablet  Commonly known as:  TOPROL XL  Take 1 tablet (25 mg total) by mouth daily.     multivitamin tablet  Take 1 tablet by mouth daily.     oxyCODONE 5 MG immediate release tablet  Commonly known as:  Oxy IR/ROXICODONE  1-2 tablets every 3-5 hrs as  needed for breakthrough pain.  SHORT ACTING PAIN MEDICATION     oxyCODONE 20 mg 12 hr tablet  Commonly known as:  OXYCONTIN  1 tablet po at 10 am and 10 pm.  LONG ACTING PAIN MEDICATION     polyethylene glycol packet  Commonly known as:  MIRALAX / GLYCOLAX  17grams in 16 oz of water twice a day until bowel movement.  LAXITIVE.  Restart if two days since last bowel movement     PRALUENT 75 MG/ML Sopn  Generic drug:  Alirocumab  Inject 75 mg into the skin every 14 (fourteen) days.     ramipril 10 MG capsule  Commonly known as:  ALTACE  Take 1 capsule (10 mg total) by mouth daily.        Diagnostic Studies: No results found.  Disposition: 01-Home or Self Care      Discharge Instructions    CPM    Complete by:  As directed   Continuous passive motion machine (CPM):      Use the CPM from 0 to 90 for 6 hours per day.       You may break it up into 2 or 3 sessions per day.      Use CPM for 2 weeks or until you are told to stop.     Call MD / Call 911    Complete by:  As directed   If you experience chest pain or shortness of breath, CALL 911 and be transported to the hospital emergency room.  If you develope a fever above 101 F, pus (white drainage) or increased drainage or redness at the wound, or calf pain, call your surgeon's office.     Change dressing    Complete by:  As directed   Change the gauze dressing daily with sterile 4 x 4 inch gauze and apply TED hose.  DO NOT REMOVE BANDAGE OVER SURGICAL INCISION.  [place] WHOLE LEG INCLUDING OVER THE WATERPROOF BANDAGE WITH SOAP AND WATER EVERY DAY.     Constipation Prevention    Complete by:  As directed   Drink plenty of fluids.  Prune juice may be helpful.  You may use a stool softener, such as Colace (over the counter) 100 mg twice a day.  Use MiraLax (over the counter) for constipation as needed.     Diet - low sodium heart healthy    Complete by:  As directed      Discharge instructions  Complete by:  As directed    INSTRUCTIONS AFTER JOINT REPLACEMENT   Remove items at home which could result in a fall. This includes throw rugs or furniture in walking pathways ICE to the affected joint every three hours while awake for 30 minutes at a time, for at least the first 3-5 days, and then as needed for pain and swelling.  Continue to use ice for pain and swelling. You may notice swelling that will progress down to the foot and ankle.  This is normal after surgery.  Elevate your leg when you are not up walking on it.   Continue to use the breathing machine you got in the hospital (incentive spirometer) which will help keep your temperature down.  It is common for your temperature to cycle up and down following surgery, especially at night when you are not up moving around and exerting yourself.  The breathing machine keeps your lungs expanded and your temperature down.   DIET:  As you were doing prior to hospitalization, we recommend a well-balanced diet.  DRESSING / WOUND CARE / SHOWERING  Keep the surgical dressing until follow up.  The dressing is water proof, so you can shower without any extra covering.  IF THE DRESSING FALLS OFF or the wound gets wet inside, change the dressing with sterile gauze.  Please use good hand washing techniques before changing the dressing.  Do not use any lotions or creams on the incision until instructed by your surgeon.    ACTIVITY  Increase activity slowly as tolerated, but follow the weight bearing instructions below.   No driving for 6 weeks or until further direction given by your physician.  You cannot drive while taking narcotics.  No lifting or carrying greater than 10 lbs. until further directed by your surgeon. Avoid periods of inactivity such as sitting longer than an hour when not asleep. This helps prevent blood clots.  You may return to work once you are authorized by your doctor.     WEIGHT BEARING   Weight bearing as tolerated with assist device (walker, cane,  etc) as directed, use it as long as suggested by your surgeon or therapist, typically at least 2-3 weeks.   EXERCISES  Results after joint replacement surgery are often greatly improved when you follow the exercise, range of motion and muscle strengthening exercises prescribed by your doctor. Safety measures are also important to protect the joint from further injury. Any time any of these exercises cause you to have increased pain or swelling, decrease what you are doing until you are comfortable again and then slowly increase them. If you have problems or questions, call your caregiver or physical therapist for advice.   Rehabilitation is important following a joint replacement. After just a few days of immobilization, the muscles of the leg can become weakened and shrink (atrophy).  These exercises are designed to build up the tone and strength of the thigh and leg muscles and to improve motion. Often times heat used for twenty to thirty minutes before working out will loosen up your tissues and help with improving the range of motion but do not use heat for the first two weeks following surgery (sometimes heat can increase post-operative swelling).   These exercises can be done on a training (exercise) mat, on the floor, on a table or on a bed. Use whatever works the best and is most comfortable for you.    Use music or television while you are exercising so that  the exercises are a pleasant break in your day. This will make your life better with the exercises acting as a break in your routine that you can look forward to.   Perform all exercises about fifteen times, three times per day or as directed.  You should exercise both the operative leg and the other leg as well.   Exercises include:   Quad Sets - Tighten up the muscle on the front of the thigh (Quad) and hold for 5-10 seconds.   Straight Leg Raises - With your knee straight (if you were given a brace, keep it on), lift the leg to 60  degrees, hold for 3 seconds, and slowly lower the leg.  Perform this exercise against resistance later as your leg gets stronger.  Leg Slides: Lying on your back, slowly slide your foot toward your buttocks, bending your knee up off the floor (only go as far as is comfortable). Then slowly slide your foot back down until your leg is flat on the floor again.  Angel Wings: Lying on your back spread your legs to the side as far apart as you can without causing discomfort.  Hamstring Strength:  Lying on your back, push your heel against the floor with your leg straight by tightening up the muscles of your buttocks.  Repeat, but this time bend your knee to a comfortable angle, and push your heel against the floor.  You may put a pillow under the heel to make it more comfortable if necessary.   A rehabilitation program following joint replacement surgery can speed recovery and prevent re-injury in the future due to weakened muscles. Contact your doctor or a physical therapist for more information on knee rehabilitation.    CONSTIPATION  Constipation is defined medically as fewer than three stools per week and severe constipation as less than one stool per week.  Even if you have a regular bowel pattern at home, your normal regimen is likely to be disrupted due to multiple reasons following surgery.  Combination of anesthesia, postoperative narcotics, change in appetite and fluid intake all can affect your bowels.   YOU MUST use at least one of the following options; they are listed in order of increasing strength to get the job done.  They are all available over the counter, and you may need to use some, POSSIBLY even all of these options:    Drink plenty of fluids (prune juice may be helpful) and high fiber foods Colace 100 mg by mouth twice a day  Senokot for constipation as directed and as needed Dulcolax (bisacodyl), take with full glass of water  Miralax (polyethylene glycol) once or twice a day as  needed.  If you have tried all these things and are unable to have a bowel movement in the first 3-4 days after surgery call either your surgeon or your primary doctor.    If you experience loose stools or diarrhea, hold the medications until you stool forms back up.  If your symptoms do not get better within 1 week or if they get worse, check with your doctor.  If you experience "the worst abdominal pain ever" or develop nausea or vomiting, please contact the office immediately for further recommendations for treatment.   ITCHING:  If you experience itching with your medications, try taking only a single pain pill, or even half a pain pill at a time.  You can also use Benadryl over the counter for itching or also to help with sleep.  TED HOSE STOCKINGS:  Use stockings on both legs until for at least 2 weeks or as directed by physician office. They may be removed at night for sleeping.  MEDICATIONS:  See your medication summary on the "After Visit Summary" that nursing will review with you.  You may have some home medications which will be placed on hold until you complete the course of blood thinner medication.  It is important for you to complete the blood thinner medication as prescribed.  PRECAUTIONS:  If you experience chest pain or shortness of breath - call 911 immediately for transfer to the hospital emergency department.   If you develop a fever greater that 101 F, purulent drainage from wound, increased redness or drainage from wound, foul odor from the wound/dressing, or calf pain - CONTACT YOUR SURGEON.                                                   FOLLOW-UP APPOINTMENTS:  If you do not already have a post-op appointment, please call the office for an appointment to be seen by your surgeon.  Guidelines for how soon to be seen are listed in your "After Visit Summary", but are typically between 1-4 weeks after surgery.  OTHER INSTRUCTIONS:   Knee Replacement:  Do not place pillow  under knee, focus on keeping the knee straight while resting. CPM instructions: 0-90 degrees, 2 hours in the morning, 2 hours in the afternoon, and 2 hours in the evening. Place foam block, curve side up under heel at all times except when in CPM or when walking.  DO NOT modify, tear, cut, or change the foam block in any way.  MAKE SURE YOU:  Understand these instructions.  Get help right away if you are not doing well or get worse.    Thank you for letting us be a part of your medical care team.  It is a privilege we respect greatly.  We hope these instructions will help you stay on track for a fast and full recovery!     Do not put a pillow under the knee. Place it under the heel.    Complete by:  As directed   Place gray foam block, curve side up under heel at all times except when in CPM or when walking.  DO NOT modify, tear, cut, or change in any way the gray foam block.     Increase activity slowly as tolerated    Complete by:  As directed      TED hose    Complete by:  As directed   Use stockings (TED hose) for 2 weeks on both leg(s).  You may remove them at night for sleeping.           Follow-up Information    Follow up with Lorn Junes, MD On 06/23/2015.   Specialty:  Orthopedic Surgery   Why:  appt time 2:15pm   Contact information:   [place] [address] [phone]        Signed: Linda Hedges 06/11/2015, 12:49 PM

## 2015-06-11 NOTE — Progress Notes (Signed)
Physical Therapy Treatment Patient Details Name: Henry Morrison MRN: [id number] DOB: 03-09-1946 Today's Date: 06/11/2015    History of Present Illness Pt admitted for R TKA. PMHx: CAD, HTN    PT Comments    Patient states he is feeling good about going home and he denies any questions or concerns. Patient declined ambulation and stairs as he had earlier performed with PA-C. HEP reviewed and handout provided. PT to follow.    Follow Up Recommendations  Supervision for mobility/OOB;Home health PT     Equipment Recommendations  3in1 (PT)    Recommendations for Other Services       Precautions / Restrictions Precautions Precautions: Knee Precaution Booklet Issued: Yes (comment) Precaution Comments: HEP provided Required Braces or Orthoses: Knee Immobilizer - Right Knee Immobilizer - Right: On when out of bed or walking;Discontinue once straight leg raise with < 10 degree lag Restrictions Weight Bearing Restrictions: Yes RLE Weight Bearing: Weight bearing as tolerated    Mobility  Bed Mobility                  Transfers                    Ambulation/Gait                 Stairs            Wheelchair Mobility    Modified Rankin (Stroke Patients Only)       Balance                                    Cognition Arousal/Alertness: Awake/alert Behavior During Therapy: WFL for tasks assessed/performed Overall Cognitive Status: Within Functional Limits for tasks assessed                      Exercises Total Joint Exercises Ankle Circles/Pumps: AROM;Both;10 reps Quad Sets: Strengthening;10 reps;Right Towel Squeeze: Strengthening;Both;5 reps Short Arc Quad: Strengthening;Right;10 reps Heel Slides: AAROM;Right;10 reps Hip ABduction/ADduction: Strengthening;Right;10 reps Straight Leg Raises: Strengthening;Right;5 reps;Other (comment) (min assist) Long Arc Quad: Strengthening;Right;10 reps Knee Flexion: AROM;Right;10  reps;Seated Goniometric ROM: 91 degrees flexion    General Comments        Pertinent Vitals/Pain Pain Assessment: Faces Faces Pain Scale: Hurts little more Pain Location: Rt knee Pain Descriptors / Indicators: Guarding;Grimacing Pain Intervention(s): Limited activity within patient's tolerance;Monitored during session    Home Living                      Prior Function            PT Goals (current goals can now be found in the care plan section) Acute Rehab PT Goals Patient Stated Goal: to go home PT Goal Formulation: With patient Time For Goal Achievement: 06/17/15 Potential to Achieve Goals: Good Progress towards PT goals: Progressing toward goals    Frequency  7X/week    PT Plan Current plan remains appropriate    Co-evaluation             End of Session   Activity Tolerance: Patient tolerated treatment well Patient left: in chair;with call bell/phone within reach;Other (comment) (in bone foam after session)     Time: [id number] PT Time Calculation (min) (ACUTE ONLY): 18 min  Charges:  $Therapeutic Exercise: 8-22 mins  G Codes:      Cassell Clement, PT, CSCS Pager [phone] Office [phone]  06/11/2015, 10:59 AM

## 2015-06-12 LAB — HEMOGLOBIN A1C
Hgb A1c MFr Bld: 5.9 % — ABNORMAL HIGH (ref 4.8–5.6)
MEAN PLASMA GLUCOSE: 123 mg/dL

## 2015-06-23 ENCOUNTER — Other Ambulatory Visit: Payer: Self-pay

## 2015-06-23 MED ORDER — RAMIPRIL 10 MG PO CAPS: 10.0000 mg | ORAL_CAPSULE | Freq: Every day | ORAL | Status: DC

## 2015-06-23 MED ORDER — ISOSORBIDE MONONITRATE ER 30 MG PO TB24: 30.0000 mg | ORAL_TABLET | Freq: Every day | ORAL | Status: DC

## 2015-06-23 NOTE — Telephone Encounter (Signed)
Rx(s) sent to pharmacy electronically.  

## 2015-06-25 ENCOUNTER — Other Ambulatory Visit: Payer: Self-pay

## 2015-06-25 MED ORDER — EZETIMIBE 10 MG PO TABS: 10.0000 mg | ORAL_TABLET | Freq: Every day | ORAL | Status: DC

## 2015-06-25 MED ORDER — METOPROLOL SUCCINATE ER 25 MG PO TB24: 25.0000 mg | ORAL_TABLET | Freq: Every day | ORAL | Status: DC

## 2015-06-25 MED ORDER — ATORVASTATIN CALCIUM 80 MG PO TABS: 80.0000 mg | ORAL_TABLET | Freq: Every day | ORAL | Status: DC

## 2015-07-25 ENCOUNTER — Other Ambulatory Visit: Payer: Self-pay | Admitting: Cardiovascular Disease

## 2015-07-25 MED ORDER — RAMIPRIL 10 MG PO CAPS: 10.0000 mg | ORAL_CAPSULE | Freq: Every day | ORAL | Status: DC

## 2015-07-25 MED ORDER — ISOSORBIDE MONONITRATE ER 30 MG PO TB24: 30.0000 mg | ORAL_TABLET | Freq: Every day | ORAL | Status: DC

## 2015-07-25 NOTE — Telephone Encounter (Signed)
Rx(s) sent to pharmacy electronically.  

## 2015-08-22 ENCOUNTER — Other Ambulatory Visit: Payer: Self-pay

## 2015-08-22 MED ORDER — METOPROLOL SUCCINATE ER 25 MG PO TB24: 25.0000 mg | ORAL_TABLET | Freq: Every day | ORAL | Status: DC

## 2015-08-25 ENCOUNTER — Other Ambulatory Visit: Payer: Self-pay | Admitting: *Deleted

## 2015-08-25 MED ORDER — ATORVASTATIN CALCIUM 80 MG PO TABS: 80.0000 mg | ORAL_TABLET | Freq: Every day | ORAL | Status: DC

## 2015-08-25 MED ORDER — EZETIMIBE 10 MG PO TABS: 10.0000 mg | ORAL_TABLET | Freq: Every day | ORAL | Status: DC

## 2015-08-25 MED ORDER — ISOSORBIDE MONONITRATE ER 30 MG PO TB24: 30.0000 mg | ORAL_TABLET | Freq: Every day | ORAL | Status: DC

## 2015-08-27 ENCOUNTER — Other Ambulatory Visit: Payer: Self-pay

## 2015-08-27 MED ORDER — RAMIPRIL 10 MG PO CAPS: 10.0000 mg | ORAL_CAPSULE | Freq: Every day | ORAL | Status: DC

## 2015-08-28 ENCOUNTER — Other Ambulatory Visit: Payer: Self-pay | Admitting: *Deleted

## 2015-08-28 MED ORDER — ATORVASTATIN CALCIUM 80 MG PO TABS: 80.0000 mg | ORAL_TABLET | Freq: Every day | ORAL | Status: DC

## 2015-08-28 MED ORDER — ISOSORBIDE MONONITRATE ER 30 MG PO TB24: 30.0000 mg | ORAL_TABLET | Freq: Every day | ORAL | Status: DC

## 2015-09-26 ENCOUNTER — Ambulatory Visit (INDEPENDENT_AMBULATORY_CARE_PROVIDER_SITE_OTHER): Payer: 59 | Admitting: Physician Assistant

## 2015-09-26 VITALS — BP 120/70 | HR 76 | Temp 98.2°F | Resp 16 | Ht 68.25 in | Wt 201.0 lb

## 2015-09-26 DIAGNOSIS — T161XXA Foreign body in right ear, initial encounter: Secondary | ICD-10-CM

## 2015-09-26 NOTE — Progress Notes (Signed)
[facility] [address], [place] [address] [phone]  Date:  09/26/2015   Name:  Henry Morrison   DOB:  10/07/1945   MRN:  [id number]  PCP:  Donnajean Lopes, MD    Chief Complaint: Foreign Body in Ear   History of Present Illness:  This is a 70 y.o. male with PMH OA, CAD, HTN, melanoma who is presenting stating there is a q-tip stuck in his ear. States this morning he was cleaning his ear and when he pulled the qtip out the tip was gone. He states his wife looked in and thought she could see the tip but thought it would be better if he came here to get it out. He denies new hearing loss. He does wear bilateral hearing aids.  Review of Systems:  Review of Systems See HPI  Patient Active Problem List   Diagnosis Date Noted  . Primary localized osteoarthritis of right knee 06/09/2015  . DJD (degenerative joint disease) of knee 06/09/2015  . Preoperative clearance 07/06/2014  . CAD (coronary artery disease) 06/14/2013  . Familial hyperlipidemia, high LDL 06/14/2013  . Essential hypertension 06/14/2013  . Melanoma ([place]) 06/14/2013    Prior to Admission medications   Medication Sig Start Date End Date Taking? Authorizing Provider  acetaminophen (TYLENOL) 500 MG tablet Take 1,000 mg by mouth daily as needed for moderate pain.   Yes Historical Provider, MD  atorvastatin (LIPITOR) 80 MG tablet Take 1 tablet (80 mg total) by mouth daily. 08/28/15  Yes Troy Sine, MD  celecoxib (CELEBREX) 200 MG capsule 1 tab po q day with food for pain and  swelling 06/11/15  Yes Kirstin Shepperson, PA-C  docusate sodium (COLACE) 100 MG capsule 1 tab 2 times a day while on narcotics.  STOOL SOFTENER 06/11/15  Yes Kirstin Shepperson, PA-C  enoxaparin (LOVENOX) 30 MG/0.3ML injection Inject 0.3 mLs (30 mg total) into the skin every 12 (twelve) hours. 06/11/15  Yes Kirstin Shepperson, PA-C  ezetimibe (ZETIA) 10 MG tablet Take 1 tablet (10 mg total) by mouth daily. 08/25/15  Yes  Troy Sine, MD  fluticasone (FLONASE) 50 MCG/ACT nasal spray Place 1 spray into both nostrils as needed for allergies.  05/24/13  Yes Historical Provider, MD  isosorbide mononitrate (IMDUR) 30 MG 24 hr tablet Take 1 tablet (30 mg total) by mouth daily. KEEP OV. 08/28/15  Yes Troy Sine, MD  metoCLOPramide (REGLAN) 5 MG tablet 1-2 tablets every 8 hrs as needed for hiccoughs or nausea 06/11/15  Yes Kirstin Shepperson, PA-C  metoprolol succinate (TOPROL XL) 25 MG 24 hr tablet Take 1 tablet (25 mg total) by mouth daily. 08/22/15  Yes Troy Sine, MD  Multiple Vitamin (MULTIVITAMIN) tablet Take 1 tablet by mouth daily.   Yes Historical Provider, MD  polyethylene glycol (MIRALAX / GLYCOLAX) packet 17grams in 16 oz of water twice a day until bowel movement.  LAXITIVE.  Restart if two days since last bowel movement 06/11/15  Yes Kirstin Shepperson, PA-C  PRALUENT 75 MG/ML SOPN Inject 75 mg into the skin every 14 (fourteen) days. 05/05/15  Yes Historical Provider, MD  ramipril (ALTACE) 10 MG capsule Take 1 capsule (10 mg total) by mouth daily. <PLEASE MAKE APPOINTMENT FOR REFILLS> 08/27/15  Yes Troy Sine, MD    Allergies  Allergen Reactions  . Other     Seasonal allergies    Past Surgical History  Procedure Laterality Date  . Coronary angioplasty with stent placement  10/2000    stenting of the CX vessel  . US echocardiography  03/07/2012    Trace AI,mild MR,mild to mod TR  . Knee arthroscopy w/ meniscectomy Left 07/15/2006  . Knee arthroscopy w/ meniscectomy Right 02/09/2012  . Removal of skin cancer      right arm  . Colonoscopy    . Total knee arthroplasty Right 06/09/2015    Procedure: RIGHT TOTAL KNEE ARTHROPLASTY;  Surgeon: Elsie Saas, MD;  Location: [facility];  Service: Orthopedics;  Laterality: Right;    Social History  Substance Use Topics  . Smoking status: Never Smoker   . Smokeless tobacco: Never Used  . Alcohol Use: 6.0 oz/week    10 Standard drinks or equivalent per week      Comment: wine 2 galsses each day    Family History  Problem Relation Age of Onset  . Heart disease Mother   . Cancer Father     kidney    Medication list has been reviewed and updated.  Physical Examination:  Physical Exam  Constitutional: He is oriented to person, place, and time. He appears well-developed and well-nourished. No distress.  HENT:  Head: Normocephalic and atraumatic.  Right Ear: Tympanic membrane, external ear and ear canal normal.  Left Ear: Tympanic membrane, external ear and ear canal normal.  Nose: Nose normal.  No foreign body in either ear canal  Eyes: Conjunctivae and lids are normal. Right eye exhibits no discharge. Left eye exhibits no discharge. No scleral icterus.  Pulmonary/Chest: Effort normal. No respiratory distress.  Musculoskeletal: Normal range of motion.  Neurological: He is alert and oriented to person, place, and time.  Skin: Skin is warm, dry and intact. No lesion and no rash noted.  Psychiatric: He has a normal mood and affect. His speech is normal and behavior is normal. Thought content normal.    BP 120/70 mmHg  Pulse 76  Temp(Src) 98.2 F (36.8 C) (Oral)  Resp 16  Ht 5' 8.25" (1.734 m)  Wt 201 lb (91.173 kg)  BMI 30.32 kg/m2  SpO2 97%  Assessment and Plan:   1. Foreign body in ear, right, initial encounter If there was a foreign body in his ear, there is no longer. Canals clear. Return as needed.   Benjaman Pott Drenda Freeze, MHS [facility]  09/26/2015

## 2015-09-26 NOTE — Patient Instructions (Signed)
     IF you received an x-ray today, you will receive an invoice from [facility]. Please contact [facility] at [phone] with questions or concerns regarding your invoice.   IF you received labwork today, you will receive an invoice from [facility]. Please contact [facility] at [phone] with questions or concerns regarding your invoice.   Our billing staff will not be able to assist you with questions regarding bills from these companies.  You will be contacted with the lab results as soon as they are available. The fastest way to get your results is to activate your My Chart account. Instructions are located on the last page of this paperwork. If you have not heard from us regarding the results in 2 weeks, please contact this office.      

## 2015-10-21 ENCOUNTER — Other Ambulatory Visit: Payer: Self-pay

## 2015-10-21 MED ORDER — EZETIMIBE 10 MG PO TABS: 10.0000 mg | ORAL_TABLET | Freq: Every day | ORAL | Status: DC

## 2015-10-29 ENCOUNTER — Other Ambulatory Visit: Payer: Self-pay | Admitting: *Deleted

## 2015-10-29 MED ORDER — PRALUENT 75 MG/ML ~~LOC~~ SOPN: 75.0000 mg | PEN_INJECTOR | SUBCUTANEOUS | Status: DC

## 2015-11-20 ENCOUNTER — Other Ambulatory Visit: Payer: Self-pay

## 2015-11-20 MED ORDER — RAMIPRIL 10 MG PO CAPS: 10.0000 mg | ORAL_CAPSULE | Freq: Every day | ORAL | Status: DC

## 2015-11-20 MED ORDER — METOPROLOL SUCCINATE ER 25 MG PO TB24: 25.0000 mg | ORAL_TABLET | Freq: Every day | ORAL | Status: DC

## 2015-11-21 ENCOUNTER — Other Ambulatory Visit: Payer: Self-pay | Admitting: Cardiovascular Disease

## 2015-11-21 MED ORDER — METOPROLOL SUCCINATE ER 25 MG PO TB24: 25.0000 mg | ORAL_TABLET | Freq: Every day | ORAL | Status: DC

## 2015-11-21 MED ORDER — RAMIPRIL 10 MG PO CAPS: 10.0000 mg | ORAL_CAPSULE | Freq: Every day | ORAL | Status: DC

## 2015-11-21 NOTE — Telephone Encounter (Signed)
Rx(s) sent to pharmacy electronically.  

## 2015-12-08 ENCOUNTER — Ambulatory Visit (INDEPENDENT_AMBULATORY_CARE_PROVIDER_SITE_OTHER): Payer: 59 | Admitting: Cardiovascular Disease

## 2015-12-08 ENCOUNTER — Encounter: Payer: Self-pay | Admitting: Cardiovascular Disease

## 2015-12-08 VITALS — BP 114/65 | HR 78 | Ht 68.0 in | Wt 204.0 lb

## 2015-12-08 DIAGNOSIS — I2510 Atherosclerotic heart disease of native coronary artery without angina pectoris: Secondary | ICD-10-CM | POA: Diagnosis not present

## 2015-12-08 DIAGNOSIS — I10 Essential (primary) hypertension: Secondary | ICD-10-CM | POA: Diagnosis not present

## 2015-12-08 DIAGNOSIS — E785 Hyperlipidemia, unspecified: Secondary | ICD-10-CM

## 2015-12-08 DIAGNOSIS — E7849 Other hyperlipidemia: Secondary | ICD-10-CM

## 2015-12-08 MED ORDER — RAMIPRIL 10 MG PO CAPS: 10.0000 mg | ORAL_CAPSULE | Freq: Every day | ORAL | 11 refills | Status: DC

## 2015-12-08 MED ORDER — METOPROLOL SUCCINATE ER 25 MG PO TB24
25.0000 mg | ORAL_TABLET | Freq: Every day | ORAL | 11 refills | Status: DC
Start: 2015-12-08 — End: 2016-11-18

## 2015-12-08 MED ORDER — EZETIMIBE 10 MG PO TABS
10.0000 mg | ORAL_TABLET | Freq: Every day | ORAL | 11 refills | Status: DC
Start: 2015-12-08 — End: 2016-11-18

## 2015-12-08 MED ORDER — ISOSORBIDE MONONITRATE ER 30 MG PO TB24: 30.0000 mg | ORAL_TABLET | Freq: Every day | ORAL | 11 refills | Status: DC

## 2015-12-08 MED ORDER — ATORVASTATIN CALCIUM 80 MG PO TABS: 80.0000 mg | ORAL_TABLET | Freq: Every day | ORAL | 11 refills | Status: DC

## 2015-12-08 NOTE — Patient Instructions (Signed)
Your physician recommends that you return for lab work in: September   Your physician wants you to follow-up in: 1 year or sooner if needed. You will receive a reminder letter in the mail two months in advance. If you don't receive a letter, please call our office to schedule the follow-up appointment.

## 2015-12-09 ENCOUNTER — Encounter: Payer: Self-pay | Admitting: Cardiovascular Disease

## 2015-12-09 NOTE — Progress Notes (Signed)
Patient ID: Henry Morrison, male   DOB: [birth year], 70 y.o.   MRN: [id number]    Primary M.D.: Henry Morrison  HPI: Henry Morrison is a 70 y.o. male who presents to the office for an 44 month cardiology evaluation.   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  He was initially referred to me 21 years ago in 1996 when he was noted to have an a Hollenhorst plaque on eye examination by Henry Morrison as well as arcus cornealis.  At that time, he had told me that he had first been told of having high cholesterol approximatey 8 years previously with cholesterols in excess of 400, but he was not on any therapy.  He had undergone a routine treadmill test which reportely was normal by Henry Morrison.  Of note, his mother had very high cholesterols throughout her life and died of a myocardial infarction at age 74. When the patient was initially referred to me, his laboratory was notable for a total cholesterol of 510 and LDL cholesterol of 422.  Since my initial evaluation, he was started on aspirin therapy as well as initial statin treatment with simvastatin..  As more aggressive statins became available, he ultimately was treated with atorvastatin 80 mg in addition to Zetia 10 mg. Of note a review of his medical record indicates that there was a short period of time when he was traveling excessively and had stopped taking therapy. Subsequent blood work in 1999 upon his return showed his cholesterol had risen back to 343 with an LDL cholesterol of 245.   A nuclear perfusion study in 2002 demonstrated new development of inferolateral ischemia. In June 2002 he underwent stenting of a subtotal occlusion of a circumflex vessel which time a 2.75x23 mm HepaCoat stent was inserted. 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 A nuclear perfusion study in October 2011 continued to show normal  perfusion without scar or ischemia.   He remains active. 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  He has been maintained on atorvastatin 80 mg and Zetia 10 mg for his marked hyperlipidemia. His blood pressure has been treated with metoprolol succinate 25 mg as well as altace 10 mg. He also is on low dose isosorbide mononitrate and has not been experiencing any recurrent anginal symptoms.  Blood work done on 06/17/2014:  total cholesterol 194, triglycerides 72, HDL 66, and LDL cholesterol is 114. Liver tests are normal. Renal function is normal with a creatinine of 0.8.  Fasting glucose was 94.  Prior to undergoing knee replacement surgery  he underwent a nuclear perfusion study on 08/06/2014.  This remained low risk and did not demonstrate scar or ischemia.  He had normal LV function.  He underwent his knee replacement in January 2017 and has done well from a cardiovascular standpoint.  After seeing me in 2016, I had a long discussion with the patient and felt he was a candidate for PCSK-9 inhibition therapy.  He has been on Prilosec when 75 mg every 14 days since the spring of 2016.  This has resulted in dramatic benefit and lab work on 12/24/2014 showed a total cholesterol of 150 with an LDL cholesterol of 43, and blood work on 04/14/2015 revealed a total cholesterol 141 and LDL cholesterol at 36.  HDL was 93, VLDL 12, and  triglycerides 59.  Henry Morrison has remained active.  He may ultimately require a second knee procedure to his other leg in the distant future.  He denies chest pain.  He denies palpitations.  He denies any injection reaction to Praluent  therapy.    Past Medical History:  Diagnosis Date  . Arthritis   . CAD (coronary artery disease)   . Cancer ([place])    skin cancer   . Hyperlipidemia   . Primary localized osteoarthritis of right knee 06/09/2015    Past Surgical History:  Procedure Laterality Date  . COLONOSCOPY    .  CORONARY ANGIOPLASTY WITH STENT PLACEMENT  10/2000   stenting of the CX vessel  . KNEE ARTHROSCOPY W/ MENISCECTOMY Left 07/15/2006  . KNEE ARTHROSCOPY W/ MENISCECTOMY Right 02/09/2012  . removal of skin cancer     right arm  . TOTAL KNEE ARTHROPLASTY Right 06/09/2015   Procedure: RIGHT TOTAL KNEE ARTHROPLASTY;  Surgeon: Henry Saas, MD;  Location: [facility];  Service: Orthopedics;  Laterality: Right;  . US ECHOCARDIOGRAPHY  03/07/2012   Trace AI,mild MR,mild to mod TR    Allergies  Allergen Reactions  . Other     Seasonal allergies    Current Outpatient Prescriptions  Medication Sig Dispense Refill  . acetaminophen (TYLENOL) 500 MG tablet Take 1,000 mg by mouth daily as needed for moderate pain.    Marland Kitchen atorvastatin (LIPITOR) 80 MG tablet Take 1 tablet (80 mg total) by mouth daily. 30 tablet 11  . celecoxib (CELEBREX) 200 MG capsule 1 tab po q day with food for pain and  swelling 30 capsule 0  . ezetimibe (ZETIA) 10 MG tablet Take 1 tablet (10 mg total) by mouth daily. 30 tablet 11  . fluticasone (FLONASE) 50 MCG/ACT nasal spray Place 1 spray into both nostrils as needed for allergies.     . isosorbide mononitrate (IMDUR) 30 MG 24 hr tablet Take 1 tablet (30 mg total) by mouth daily. 30 tablet 11  . metoprolol succinate (TOPROL XL) 25 MG 24 hr tablet Take 1 tablet (25 mg total) by mouth daily. 30 tablet 11  . Multiple Vitamin (MULTIVITAMIN) tablet Take 1 tablet by mouth daily.    Marland Kitchen PRALUENT 75 MG/ML SOPN Inject 75 mg into the skin every 14 (fourteen) days. 6 pen 1  . ramipril (ALTACE) 10 MG capsule Take 1 capsule (10 mg total) by mouth daily. 30 capsule 11   No current facility-administered medications for this visit.     Social History   Social History  . Marital status: Married    Spouse name: N/A  . Number of children: N/A  . Years of education: N/A   Occupational History  . Not on file.   Social History Main Topics  . Smoking status: Never Smoker  . Smokeless tobacco: Never  Used  . Alcohol use 6.0 oz/week    10 Standard drinks or equivalent per week     Comment: wine 2 galsses each day  . Drug use: No  . Sexual activity: Not on file   Other Topics Concern  . Not on file   Social History Narrative  . No narrative on file    Family History  Problem Relation Age of Onset  . Heart disease Mother   . Cancer Father     kidney    ROS General: Negative; No fevers, chills, or night sweats;  HEENT: Negative; No changes in vision or hearing, sinus congestion, difficulty swallowing Pulmonary: Negative; No cough, wheezing,  shortness of breath, hemoptysis Cardiovascular: Negative; No chest pain, presyncope, syncope, palpitations GI: Negative; No nausea, vomiting, diarrhea, or abdominal pain GU: Negative; No dysuria, hematuria, or difficulty voiding Musculoskeletal: Negative; no myalgias, joint pain, or weakness Hematologic/Oncology: Negative; no easy bruising, bleeding Endocrine: Negative; no heat/cold intolerance; no diabetes Neuro: Negative; no changes in balance, headaches Skin:  History of melanoma resection Psychiatric: Negative; No behavioral problems, depression Sleep: Negative; No snoring, daytime sleepiness, hypersomnolence, bruxism, restless legs, hypnogognic hallucinations, no cataplexy Other comprehensive 14 point system review is negative.   PE BP 114/65   Pulse 78   Ht 5' 8"  (1.727 m)   Wt 204 lb (92.5 kg)   BMI 31.02 kg/m   Wt Readings from Last 3 Encounters:  12/08/15 204 lb (92.5 kg)  09/26/15 201 lb (91.2 kg)  06/09/15 200 lb 1.6 oz (90.8 kg)   General: Alert, oriented, no distress.  Skin: normal turgor, no rashes HEENT: Normocephalic, atraumatic. Pupils round and reactive; sclera anicteric;no lid lag. Extraocular muscles intact. Positive for arcus cornealis. Nose without nasal septal hypertrophy Mouth/Parynx benign; Mallinpatti scale2  Neck: No JVD, no carotid bruits; normal carotid upstroke Lungs: clear to ausculatation and  percussion; no wheezing or rales Chest wall: no tenderness to palpitation Heart: RRR, s1 s2 normal faint 1/6 systolic murmur. Abdomen: soft, nontender; no hepatosplenomehaly, BS+; abdominal aorta nontender and not dilated by palpation. Back: no CVA tenderness Pulses 2+ Extremities: no clubbing cyanosis or edema, Homan's sign negative  Neurologic: grossly nonfocal; cranial nerves grossly normal. Psychologic: normal affect and mood.  ECG (independently read by me): Normal sinus rhythm at 78 bpm.  Normal intervals.  No significant ST segment changes.  February 2016 ECG (independently read by me): Sinus bradycardia 54 bpm.  Normal intervals.  No ST segment changes.  January 2015ECG (independently read by me): Normal sinus rhythm. No significant ST-T changes. PoorR wave progression.  LABS: BMP Latest Ref Rng & Units 06/11/2015 06/10/2015 06/09/2015  Glucose 65 - 99 mg/dL 170(H) 234(H) -  BUN 6 - 20 mg/dL 24(H) 24(H) -  Creatinine 0.61 - 1.24 mg/dL 0.97 1.11 0.81  Sodium 135 - 145 mmol/L 139 138 -  Potassium 3.5 - 5.1 mmol/L 4.9 4.5 -  Chloride 101 - 111 mmol/L 101 102 -  CO2 22 - 32 mmol/L 25 23 -  Calcium 8.9 - 10.3 mg/dL 8.1(L) 8.8(L) -   Hepatic Function Latest Ref Rng & Units 06/02/2015 04/14/2015 12/17/2014  Total Protein 6.5 - 8.1 g/dL 6.6 6.8 6.9  Albumin 3.5 - 5.0 g/dL 4.0 4.3 4.4  AST 15 - 41 U/L 29 27 28   ALT 17 - 63 U/L 33 42 44  Alk Phosphatase 38 - 126 U/L 53 57 50  Total Bilirubin 0.3 - 1.2 mg/dL 0.8 0.8 0.8  Bilirubin, Direct <=0.2 mg/dL - 0.2 0.2   CBC Latest Ref Rng & Units 06/11/2015 06/10/2015 06/09/2015  WBC 4.0 - 10.5 K/uL 15.7(H) 16.6(H) 9.5  Hemoglobin 13.0 - 17.0 g/dL 9.8(L) 11.4(L) 13.1  Hematocrit 39.0 - 52.0 % 30.3(L) 33.8(L) 40.0  Platelets 150 - 400 K/uL 201 206 173   Lab Results  Component Value Date   MCV 92.7 06/11/2015   MCV 92.3 06/10/2015   MCV 92.6 06/09/2015   No results found for: TSH  Lab Results  Component Value Date   HGBA1C 5.9 (H)  06/11/2015   Lipid Panel     Component Value Date/Time   CHOL 141 04/14/2015 0804   TRIG 59 04/14/2015 0804   HDL  93 04/14/2015 0804   CHOLHDL 1.5 04/14/2015 0804   VLDL 12 04/14/2015 0804   LDLCALC 36 04/14/2015 0804   Most recent blood work by Henry. Sharlett Iles of [facility] from 07/03/2015 was reviewed. Cholesterol 128, triglycerides 80, HDL 57, LDL 55.   ASSESSMENT AND PLAN: Henry Morrison is a 70 year old gentleman who most likely has familial hyperlipidemia with initial cholesterols in excess of 500 and LDL cholesterols in excess of 400. Over the last 20 years, I have been aggressively managing his lipid studies.  In 2002 he was found to have a subtotally occluded obtuse marginal 1 stenosis after nuclear study suggested inferolateral ischemia.  He did have concomitant CAD, also involving his LAD and RCA.  A nuclear study done in 2016 for preoperative clearance prior to his knee surgery.  Continue to reveal normal perfusion without scar or ischemia.  Over the past year, in addition to his atorvastatin 80 mg, Zetia 10 mg, he has been taking Praluent, a PSK-9 inhibitor which inhibits degradation of the LDL receptor. Based on the [place] diagnostic criteria he has familial hypercholesterolemia with a score of at least 13.  He has had a dramatic response to this adjunctive treatment with LDLs dropping to a nadir of 36 in November 2016 and most recently in February 2016 or at 107.  His blood pressure today is stable on Toprol-XL 25 mg daily in addition to his isosorbide 30 mg and Remeron thrill 10 mg daily.  He is not having any anginal symptomatology.  He continues to be active.  Now that his right knee has been replaced he is able to be more active.  He believes in the distant future he may require left knee replacement.  His ECG remained stable.  I am recommending that he undergo follow-up blood work in September 2017, and I will contact him regarding the results.  As long as he  remains stable I will see him in one year for reevaluation.  Time spent: 25 minutes   Troy Sine, MD, [facility]  12/09/2015 2:59 PM

## 2016-03-30 ENCOUNTER — Other Ambulatory Visit: Payer: Self-pay | Admitting: Pharmacist

## 2016-03-30 MED ORDER — PRALUENT 75 MG/ML ~~LOC~~ SOPN: 75.0000 mg | PEN_INJECTOR | SUBCUTANEOUS | 3 refills | Status: DC

## 2016-05-18 ENCOUNTER — Telehealth: Payer: Self-pay | Admitting: Cardiovascular Disease

## 2016-05-18 NOTE — Telephone Encounter (Signed)
Henry Morrison at 05/18/2016 2:46 PM   Status: Signed    New Message   *STAT* If patient is at the pharmacy, call can be transferred to refill team.   1. Which medications need to be refilled? (please list name of each medication and dose if known)  pRALUENT 75 MG/ML SOPN Inject 75 mg into the skin every 14 (fourteen) days.     2. Which pharmacy/location (including street and city if local pharmacy) is medication to be sent to [facility]  3. Do they need a 30 day or 90 day supply? 30   Need authorization sent through

## 2016-05-18 NOTE — Telephone Encounter (Signed)
Another telephone note already started on this request. This encounter closed.

## 2016-05-18 NOTE — Telephone Encounter (Signed)
Mr. Henry Morrison is calling because he states that he is out of medication (praluent) and needs a refill. Please call thanks.

## 2016-05-18 NOTE — Telephone Encounter (Signed)
New Message   *STAT* If patient is at the pharmacy, call can be transferred to refill team.   1. Which medications need to be refilled? (please list name of each medication and dose if known)  pRALUENT 75 MG/ML SOPN Inject 75 mg into the skin every 14 (fourteen) days.     2. Which pharmacy/location (including street and city if local pharmacy) is medication to be sent to [facility]  3. Do they need a 30 day or 90 day supply? 30   Need authorization sent through

## 2016-05-19 LAB — CBC
HEMATOCRIT: 41.9 % (ref 38.5–50.0)
Hemoglobin: 13.5 g/dL (ref 13.2–17.1)
MCH: 30 pg (ref 27.0–33.0)
MCHC: 32.2 g/dL (ref 32.0–36.0)
MCV: 93.1 fL (ref 80.0–100.0)
MPV: 10.5 fL (ref 7.5–12.5)
Platelets: 209 10*3/uL (ref 140–400)
RBC: 4.5 MIL/uL (ref 4.20–5.80)
RDW: 14.5 % (ref 11.0–15.0)
WBC: 6.1 10*3/uL (ref 3.8–10.8)

## 2016-05-20 LAB — LIPID PANEL
CHOL/HDL RATIO: 1.4 ratio (ref <5.0)
Cholesterol: 121 mg/dL (ref <200)
HDL: 84 mg/dL (ref >40)
LDL CALC: 24 mg/dL (ref <100)
TRIGLYCERIDES: 65 mg/dL (ref <150)
VLDL: 13 mg/dL (ref <30)

## 2016-05-20 LAB — COMPREHENSIVE METABOLIC PANEL
ALBUMIN: 4.2 g/dL (ref 3.6–5.1)
ALT: 34 U/L (ref 9–46)
AST: 23 U/L (ref 10–35)
Alkaline Phosphatase: 47 U/L (ref 40–115)
BUN: 22 mg/dL (ref 7–25)
CALCIUM: 9.3 mg/dL (ref 8.6–10.3)
CHLORIDE: 107 mmol/L (ref 98–110)
CO2: 26 mmol/L (ref 20–31)
Creat: 0.9 mg/dL (ref 0.70–1.25)
GLUCOSE: 99 mg/dL (ref 65–99)
Potassium: 4.4 mmol/L (ref 3.5–5.3)
Sodium: 140 mmol/L (ref 135–146)
Total Bilirubin: 0.9 mg/dL (ref 0.2–1.2)
Total Protein: 6.4 g/dL (ref 6.1–8.1)

## 2016-05-21 ENCOUNTER — Telehealth: Payer: Self-pay | Admitting: *Deleted

## 2016-05-21 NOTE — Telephone Encounter (Signed)
-----   Message from Troy Sine, MD sent at 05/20/2016  3:20 PM EST ----- Labs excellent;  Magnificent response to PCSK inhibition with LDL 24!!!

## 2016-05-21 NOTE — Telephone Encounter (Signed)
Patient notified of lab results

## 2016-11-18 ENCOUNTER — Other Ambulatory Visit: Payer: Self-pay | Admitting: Cardiovascular Disease

## 2016-11-18 NOTE — Telephone Encounter (Signed)
Rx(s) sent to pharmacy electronically.  

## 2017-02-02 ENCOUNTER — Encounter: Payer: Self-pay | Admitting: Cardiovascular Disease

## 2017-02-02 ENCOUNTER — Other Ambulatory Visit: Payer: Self-pay | Admitting: Pharmacist Clinician (PhC)/ Clinical Pharmacy Specialist

## 2017-02-02 ENCOUNTER — Ambulatory Visit (INDEPENDENT_AMBULATORY_CARE_PROVIDER_SITE_OTHER): Payer: 59 | Admitting: Cardiovascular Disease

## 2017-02-02 VITALS — BP 124/72 | HR 54 | Ht 68.0 in | Wt 199.0 lb

## 2017-02-02 DIAGNOSIS — E7849 Other hyperlipidemia: Secondary | ICD-10-CM

## 2017-02-02 DIAGNOSIS — E784 Other hyperlipidemia: Secondary | ICD-10-CM | POA: Diagnosis not present

## 2017-02-02 DIAGNOSIS — I10 Essential (primary) hypertension: Secondary | ICD-10-CM | POA: Diagnosis not present

## 2017-02-02 DIAGNOSIS — I2510 Atherosclerotic heart disease of native coronary artery without angina pectoris: Secondary | ICD-10-CM | POA: Diagnosis not present

## 2017-02-02 MED ORDER — ALIROCUMAB 150 MG/ML ~~LOC~~ SOPN: 150.0000 mg | PEN_INJECTOR | SUBCUTANEOUS | 3 refills | Status: DC

## 2017-02-02 NOTE — Telephone Encounter (Signed)
LDL increased

## 2017-02-02 NOTE — Progress Notes (Signed)
Patient ID: Henry Morrison, male   DOB: 1945/08/15, 72 y.o.   MRN: [id number]    Primary M.D.: Dr. Bevelyn Buckles  HPI: Henry Morrison is a 71 y.o. male who presents to the office for an 51 month cardiology evaluation.   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  At that time, he had told me that he had first been told of having high cholesterol approximatey 8 years previously with cholesterols in excess of 400, but he was not on any therapy.  He had undergone a routine treadmill test which reportely was normal by Dr. Sherald Barge.  Of note, his mother had very high cholesterols throughout her life and died of a myocardial infarction at age 29. When the patient was initially referred to me, his laboratory was notable for a total cholesterol of 510 and LDL cholesterol of 422.  Since my initial evaluation, he was started on aspirin therapy as well as initial statin treatment with simvastatin..  As more aggressive statins became available, he ultimately was treated with atorvastatin 80 mg in addition to Zetia 10 mg. Of note a review of his medical record indicates that there was a short period of time when he was traveling excessively and had stopped taking therapy. Subsequent blood work in 1999 upon his return showed his cholesterol had risen back to 343 with an LDL cholesterol of 245.   A nuclear perfusion study in 2002 demonstrated new development of inferolateral ischemia. In June 2002 he underwent stenting of a subtotal occlusion of a circumflex vessel which time a 2.75x23 mm HepaCoat stent was inserted. 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 A nuclear perfusion study in October 2011 continued to show normal  perfusion without scar or ischemia.   He remains active. 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  He has been maintained on atorvastatin 80 mg and Zetia 10 mg for his marked hyperlipidemia. His blood pressure has been treated with metoprolol succinate 25 mg as well as altace 10 mg. He also is on low dose isosorbide mononitrate and has not been experiencing any recurrent anginal symptoms.  Blood work done on 06/17/2014:  total cholesterol 194, triglycerides 72, HDL 66, and LDL cholesterol is 114. Liver tests are normal. Renal function is normal with a creatinine of 0.8.  Fasting glucose was 94.  Prior to undergoing knee replacement surgery  he underwent a nuclear perfusion study on 08/06/2014.  This remained low risk and did not demonstrate scar or ischemia.  He had normal LV function.  He underwent his knee replacement in January 2017 and has done well from a cardiovascular standpoint.  After seeing me in 2016, I had a long discussion with the patient and felt he was a candidate for PCSK-9 inhibition therapy.  He has been on Prilosec when 75 mg every 14 days since the spring of 2016.  This has resulted in dramatic benefit and lab work on 12/24/2014 showed a total cholesterol of 150 with an LDL cholesterol of 43, and blood work on 04/14/2015 revealed a total cholesterol 141 and LDL cholesterol at 36.  HDL was 93, VLDL 12, and  triglycerides 59.  Subsequent laboratory continued to show improvement in LDL and in January 2018 LDL had reduced to 24.  He recently underwent subsequent blood work on 07/09/2016 which showed an LDL of 57.  He continues to be on atorvastatin 80 mg, Zetia 10 mg, and apparently is only on the reduced dose of Praluent at 75 mg every 2 weeks.   He is now fully retired.  He is staying active with his grandchildren and travels.  He denies recurrent chest pain, PND, orthopnea.  He presents for evaluation.   Past Medical History:    Diagnosis Date  . Arthritis   . CAD (coronary artery disease)   . Cancer ([place])    skin cancer   . Hyperlipidemia   . Primary localized osteoarthritis of right knee 06/09/2015    Past Surgical History:  Procedure Laterality Date  . COLONOSCOPY    . CORONARY ANGIOPLASTY WITH STENT PLACEMENT  10/2000   stenting of the CX vessel  . KNEE ARTHROSCOPY W/ MENISCECTOMY Left 07/15/2006  . KNEE ARTHROSCOPY W/ MENISCECTOMY Right 02/09/2012  . removal of skin cancer     right arm  . TOTAL KNEE ARTHROPLASTY Right 06/09/2015   Procedure: RIGHT TOTAL KNEE ARTHROPLASTY;  Surgeon: Elsie Saas, MD;  Location: [facility];  Service: Orthopedics;  Laterality: Right;  . US ECHOCARDIOGRAPHY  03/07/2012   Trace AI,mild MR,mild to mod TR    Allergies  Allergen Reactions  . Other     Seasonal allergies    Current Outpatient Prescriptions  Medication Sig Dispense Refill  . acetaminophen (TYLENOL) 500 MG tablet Take 1,000 mg by mouth daily as needed for moderate pain.    Marland Kitchen atorvastatin (LIPITOR) 80 MG tablet Take 1 tablet (80 mg total) by mouth daily. MUST KEEP APPOINTMENT 02/02/17 WITH DR Claiborne Billings FOR FUTURE REFILLS 30 tablet 2  . ezetimibe (ZETIA) 10 MG tablet Take 1 tablet (10 mg total) by mouth daily. MUST KEEP APPOINTMENT 02/02/17 WITH DR Claiborne Billings FOR FUTURE REFILLS 30 tablet 2  . fluticasone (FLONASE) 50 MCG/ACT nasal spray Place 1 spray into both nostrils as needed for allergies.     . isosorbide mononitrate (IMDUR) 30 MG 24 hr tablet Take 1 tablet (30 mg total) by mouth daily. MUST KEEP APPOINTMENT 02/02/17 WITH DR Claiborne Billings FOR FUTURE REFILLS 30 tablet 2  . metoprolol succinate (TOPROL-XL) 25 MG 24 hr tablet Take 1 tablet (25 mg total) by mouth daily. MUST KEEP APPOINTMENT 02/02/17 WITH DR Claiborne Billings FOR FUTURE REFILLS 30 tablet 2  . Multiple Vitamin (MULTIVITAMIN) tablet Take 1 tablet by mouth daily.    . ramipril (ALTACE) 10 MG capsule Take 1 capsule (10 mg total) by mouth daily. MUST KEEP APPOINTMENT 02/02/17 WITH DR  Claiborne Billings FOR FUTURE REFILLS 30 capsule 2  . Alirocumab (PRALUENT) 150 MG/ML SOPN Inject 150 mg into the skin every 14 (fourteen) days. 6 pen 3   No current facility-administered medications for this visit.     Social History   Social History  . Marital status: Married    Spouse name: N/A  . Number of children: N/A  . Years of education: N/A   Occupational History  . Not on file.   Social History Main Topics  . Smoking status: Never Smoker  . Smokeless tobacco: Never Used  . Alcohol use 6.0 oz/week    10 Standard drinks or equivalent per week     Comment: wine 2 galsses each day  . Drug use: No  . Sexual activity: Not on file  Other Topics Concern  . Not on file   Social History Narrative  . No narrative on file    Family History  Problem Relation Age of Onset  . Heart disease Mother   . Cancer Father        kidney    ROS General: Negative; No fevers, chills, or night sweats;  HEENT: Negative; No changes in vision or hearing, sinus congestion, difficulty swallowing Pulmonary: Negative; No cough, wheezing, shortness of breath, hemoptysis Cardiovascular: Negative; No chest pain, presyncope, syncope, palpitations GI: Negative; No nausea, vomiting, diarrhea, or abdominal pain GU: Negative; No dysuria, hematuria, or difficulty voiding Musculoskeletal: Negative; no myalgias, joint pain, or weakness Hematologic/Oncology: Negative; no easy bruising, bleeding Endocrine: Negative; no heat/cold intolerance; no diabetes Neuro: Negative; no changes in balance, headaches Skin:  History of melanoma resection Psychiatric: Negative; No behavioral problems, depression Sleep: Negative; No snoring, daytime sleepiness, hypersomnolence, bruxism, restless legs, hypnogognic hallucinations, no cataplexy Other comprehensive 14 point system review is negative.   PE BP 124/72   Pulse (!) 54   Ht _0  (1.727 m)   Wt 199 lb (90.3 kg)   BMI 30.26 kg/m    Wt Readings from Last 3  Encounters:  02/02/17 199 lb (90.3 kg)  12/08/15 204 lb (92.5 kg)  09/26/15 201 lb (91.2 kg)   General: Alert, oriented, no distress.  Skin: normal turgor, no rashes, warm and dry HEENT: Normocephalic, atraumatic. Pupils equal round and reactive to light; sclera anicteric; extraocular muscles intact;  Nose without nasal septal hypertrophy Mouth/Parynx benign; Mallinpatti scale 2 Neck: No JVD, no carotid bruits; normal carotid upstroke Lungs: clear to ausculatation and percussion; no wheezing or rales Chest wall: without tenderness to palpitation Heart: PMI not displaced, RRR, s1 s2 normal, 1/6 systolic murmur, no diastolic murmur, no rubs, gallops, thrills, or heaves Abdomen: soft, nontender; no hepatosplenomehaly, BS+; abdominal aorta nontender and not dilated by palpation. Back: no CVA tenderness Pulses 2+ Musculoskeletal: full range of motion, normal strength, no joint deformities Extremities: no clubbing cyanosis or edema, Homan's sign negative  Neurologic: grossly nonfocal; Cranial nerves grossly wnl Psychologic: Normal mood and affect   ECG (independently read by me): sinus bradycardia 54 bpm.  Normal intervals.  No ST segment changes.  July 2017 ECG (independently read by me): Normal sinus rhythm at 78 bpm.  Normal intervals.  No significant ST segment changes.  February 2016 ECG (independently read by me): Sinus bradycardia 54 bpm.  Normal intervals.  No ST segment changes.  January 2015ECG (independently read by me): Normal sinus rhythm. No significant ST-T changes. PoorR wave progression.  LABS: BMP Latest Ref Rng & Units 05/19/2016 06/11/2015 06/10/2015  Glucose 65 - 99 mg/dL 99 170(H) 234(H)  BUN 7 - 25 mg/dL 22 24(H) 24(H)  Creatinine 0.70 - 1.25 mg/dL 0.90 0.97 1.11  Sodium 135 - 146 mmol/L 140 139 138  Potassium 3.5 - 5.3 mmol/L 4.4 4.9 4.5  Chloride 98 - 110 mmol/L 107 101 102  CO2 20 - 31 mmol/L _1 Calcium 8.6 - 10.3 mg/dL 9.3 8.1(L) 8.8(L)   Hepatic  Function Latest Ref Rng & Units 05/19/2016 06/02/2015 04/14/2015  Total Protein 6.1 - 8.1 g/dL 6.4 6.6 6.8  Albumin 3.6 - 5.1 g/dL 4.2 4.0 4.3  AST 10 - 35 U/L _2 ALT 9 - 46 U/L 34 33 42  Alk Phosphatase 40 - 115 U/L 47 53 57  Total Bilirubin 0.2 - 1.2 mg/dL 0.9 0.8 0.8  Bilirubin, Direct <=  0.2 mg/dL - - 0.2   CBC Latest Ref Rng & Units 05/19/2016 06/11/2015 06/10/2015  WBC 3.8 - 10.8 K/uL 6.1 15.7(H) 16.6(H)  Hemoglobin 13.2 - 17.1 g/dL 13.5 9.8(L) 11.4(L)  Hematocrit 38.5 - 50.0 % 41.9 30.3(L) 33.8(L)  Platelets 140 - 400 K/uL 209 201 206   Lab Results  Component Value Date   MCV 93.1 05/19/2016   MCV 92.7 06/11/2015   MCV 92.3 06/10/2015   No results found for: TSH  Lab Results  Component Value Date   HGBA1C 5.9 (H) 06/11/2015   Lipid Panel     Component Value Date/Time   CHOL 121 05/19/2016 0807   TRIG 65 05/19/2016 0807   HDL 84 05/19/2016 0807   CHOLHDL 1.4 05/19/2016 0807   VLDL 13 05/19/2016 0807   LDLCALC 24 05/19/2016 0807   Most recent blood work by Dr. Sharlett Iles of [place] from 07/09/2016 was reviewed4 Cholesterol 125, triglycerides 71, HDL 54, LDL 57.   IMPRESSION: 1. Coronary artery disease involving native coronary artery of native heart without angina pectoris   2. Essential hypertension   3. Familial hyperlipidemia, high LDL     ASSESSMENT AND PLAN: Henry Morrison is a 71 year old gentleman who most likely has familial hyperlipidemia with initial cholesterols in excess of 500 and LDL cholesterols in excess of 400. Over the last 20 years, I have been aggressively managing his lipid studies.  In 2002 he was found to have a subtotally occluded obtuse marginal 1 stenosis after nuclear study suggested inferolateral ischemia.  There was concomitant CAD, also involving his LAD and RCA.  A nuclear study done in 2016 for preoperative clearance prior to his knee surgery revealed continued normal perfusion without scar or ischemia.  Based on  the [place] diagnostic criteria.  He has familial hypercholesterolemia with a score of at least 13.  He has been on PCS canine inhibition with apparently went since 2016.  He has continued to take atorvastatin 80 mg and Zetia 10 mg.  His most recent LDL was 57.  He apparently is only on the reduced dose.  Apparently went and I have suggested that he take a more standard 150 mg dose which are be more comparable to the Repatha 140 mg every 2 week dosing.  He denies any intolerance to the injection sites.  His blood pressure today is well controlled and he is not having any anginal symptoms on his regimen consisting of grandma Toprol 10 mg, Toprol-XL 25 mg, in addition to isosorbide.  He continues to take atorvastatin 80 mg and Zetia.  He will undergo repeat blood work in 3 months.I will see him in 6 months for reevaluation. Time spent: 25 minutes   Troy Sine, MD, [facility]  02/04/2017 6:31 PM

## 2017-02-02 NOTE — Patient Instructions (Signed)
Medication Instructions:   Your physician recommends that you continue on your current medications as directed. Please refer to the Current Medication list given to you today.  Labwork: NONE  Testing/Procedures: Your physician has requested that you have en exercise stress myoview [facility]. For further information please visit [email or web address]. Please follow instruction sheet, as given.   Follow-Up: Your physician wants you to follow-up in: 6 MONTHS with Dr. Claiborne Billings (after stress test). You will receive a reminder letter in the mail two months in advance. If you don't receive a letter, please call our office to schedule the follow-up appointment.   Any Other Special Instructions Will Be Listed Below (If Applicable).     If you need a refill on your cardiac medications before your next appointment, please call your pharmacy.

## 2017-02-18 ENCOUNTER — Other Ambulatory Visit: Payer: Self-pay | Admitting: Cardiovascular Disease

## 2017-02-18 NOTE — Telephone Encounter (Signed)
Rx(s) sent to pharmacy electronically.  

## 2017-05-03 ENCOUNTER — Ambulatory Visit (INDEPENDENT_AMBULATORY_CARE_PROVIDER_SITE_OTHER): Payer: 59 | Admitting: Pharmacist Clinician (PhC)/ Clinical Pharmacy Specialist

## 2017-05-03 DIAGNOSIS — E785 Hyperlipidemia, unspecified: Secondary | ICD-10-CM

## 2017-05-03 DIAGNOSIS — E7849 Other hyperlipidemia: Secondary | ICD-10-CM

## 2017-05-03 LAB — LIPID PANEL
CHOL/HDL RATIO: 1.6 ratio (ref 0.0–5.0)
Cholesterol, Total: 105 mg/dL (ref 100–199)
HDL: 67 mg/dL (ref >39)
LDL Calculated: 24 mg/dL (ref 0–99)
TRIGLYCERIDES: 69 mg/dL (ref 0–149)
VLDL Cholesterol Cal: 14 mg/dL (ref 5–40)

## 2017-05-03 NOTE — Progress Notes (Signed)
05/04/2017 [name] [birth year] [id number]   HPI:  Henry Morrison is a 71 y.o. male patient of Dr Claiborne Billings, who presents today for a lipid clinic evaluation.  Mr. Phifer has a medical history significant for familial hyperlipidemia, with a baseline LDL cholesterol of > 400.  He is currently on atorvastatin 80 mg, ezetimibe 10 mg and Praluent 150 mg.  With this combination his LDL has dropped to 24 earlier this year.  In addition to this he has CAD (DES to circumflex) and hypertension.    Current Medications:  Atorvastatin 80 mg  Ezetimibe 10 mg  alirocumab 150 mg  Social history:   2 glasses wine per day, no tobacco  Family history:   Mother had heart disease  Daughter also with familial hyperlipidemia  Diet:   Limits white foods, doesn't care for vegetables, eats out some  Exercise:    golf regularly, stationary bike qod, walks in good weather  Labs:   06/2016:  TC 125, TG 71, HDL 54, LDL 57  Current Outpatient Medications  Medication Sig Dispense Refill  . acetaminophen (TYLENOL) 500 MG tablet Take 1,000 mg by mouth daily as needed for moderate pain.    . Alirocumab (PRALUENT) 150 MG/ML SOPN Inject 150 mg into the skin every 14 (fourteen) days. 6 pen 3  . atorvastatin (LIPITOR) 80 MG tablet take 1 tablet by mouth once daily 30 tablet 11  . ezetimibe (ZETIA) 10 MG tablet take 1 tablet by mouth once daily 30 tablet 11  . fluticasone (FLONASE) 50 MCG/ACT nasal spray Place 1 spray into both nostrils as needed for allergies.     . isosorbide mononitrate (IMDUR) 30 MG 24 hr tablet take 1 tablet by mouth once daily 30 tablet 11  . metoprolol succinate (TOPROL-XL) 25 MG 24 hr tablet take 1 tablet by mouth once daily 30 tablet 11  . Multiple Vitamin (MULTIVITAMIN) tablet Take 1 tablet by mouth daily.    . ramipril (ALTACE) 10 MG capsule take 1 capsule by mouth once daily 30 capsule 11   No current facility-administered medications for this visit.     Allergies  Allergen Reactions   . Other     Seasonal allergies    Past Medical History:  Diagnosis Date  . Arthritis   . CAD (coronary artery disease)   . Cancer ([place])    skin cancer   . Hyperlipidemia   . Primary localized osteoarthritis of right knee 06/09/2015    There were no vitals taken for this visit.   Familial hyperlipidemia, high LDL Patient with familial hyperlipidemia with LDL now down to 24 on high intensity statin, ezetimibe and alirocumab.   He has recently retired and has now started a Landscape architect.   He is concerned about high costs of PCSK-9 inhibitors now that he is on Medicare.  Will submit the lower cost evolocumab 140 mg to his new insurance.  Patient aware of change in product but agreeable due to cost concerns.     Henry Morrison PharmD CPP [facility]

## 2017-05-03 NOTE — Patient Instructions (Signed)
We will start the process to get Repatha 140 mg every 2 weeks.  If you have any questions/concerns do not hesitate to call.  Cyler Kappes/Raquel at [phone]   Cholesterol Cholesterol is a fat. Your body needs a small amount of cholesterol. Cholesterol (plaque) may build up in your blood vessels (arteries). That makes you more likely to have a heart attack or stroke. You cannot feel your cholesterol level. Having a blood test is the only way to find out if your level is high. Keep your test results. Work with your doctor to keep your cholesterol at a good level. What do the results mean?  Total cholesterol is how much cholesterol is in your blood.  LDL is bad cholesterol. This is the type that can build up. Try to have low LDL.  HDL is good cholesterol. It cleans your blood vessels and carries LDL away. Try to have high HDL.  Triglycerides are fat that the body can store or burn for energy. What are good levels of cholesterol?  Total cholesterol below 200.  LDL below 100 is good for people who have health risks. LDL below 70 is good for people who have very high risks.  HDL above 40 is good. It is best to have HDL of 60 or higher.  Triglycerides below 150. How can I lower my cholesterol? Diet Follow your diet program as told by your doctor.  Choose fish, white meat chicken, or [place] that is roasted or baked. Try not to eat red meat, fried foods, sausage, or lunch meats.  Eat lots of fresh fruits and vegetables.  Choose whole grains, beans, pasta, potatoes, and cereals.  Choose olive oil, corn oil, or canola oil. Only use small amounts.  Try not to eat butter, mayonnaise, shortening, or palm kernel oils.  Try not to eat foods with trans fats.  Choose low-fat or nonfat dairy foods. ? Drink skim or nonfat milk. ? Eat low-fat or nonfat yogurt and cheeses. ? Try not to drink whole milk or cream. ? Try not to eat ice cream, egg yolks, or full-fat cheeses.  Healthy desserts  include angel food cake, ginger snaps, animal crackers, hard candy, popsicles, and low-fat or nonfat frozen yogurt. Try not to eat pastries, cakes, pies, and cookies.  Exercise Follow your exercise program as told by your doctor.  Be more active. Try gardening, walking, and taking the stairs.  Ask your doctor about ways that you can be more active.  Medicine  Take over-the-counter and prescription medicines only as told by your doctor.  This information is not intended to replace advice given to you by your health care provider. Make sure you discuss any questions you have with your health care provider. Document Released: 07/30/2008 Document Revised: 12/03/2015 Document Reviewed: 11/13/2015 Elsevier Interactive Patient Education  2017 [employer].

## 2017-05-04 ENCOUNTER — Encounter: Payer: Self-pay | Admitting: Pharmacist Clinician (PhC)/ Clinical Pharmacy Specialist

## 2017-05-04 NOTE — Assessment & Plan Note (Signed)
Patient with familial hyperlipidemia with LDL now down to 24 on high intensity statin, ezetimibe and alirocumab.   He has recently retired and has now started a Landscape architect.   He is concerned about high costs of PCSK-9 inhibitors now that he is on Medicare.  Will submit the lower cost evolocumab 140 mg to his new insurance.  Patient aware of change in product but agreeable due to cost concerns.

## 2017-05-05 ENCOUNTER — Encounter: Payer: Self-pay | Admitting: *Deleted

## 2017-05-19 ENCOUNTER — Telehealth: Payer: Self-pay | Admitting: Cardiovascular Disease

## 2017-05-19 NOTE — Telephone Encounter (Signed)
Forward to [facility]- [place]

## 2017-05-19 NOTE — Telephone Encounter (Signed)
New message     [facility] called regarding preauth for Repatha     Left message with answering service 05/15/17

## 2017-06-06 ENCOUNTER — Other Ambulatory Visit: Payer: Self-pay | Admitting: Pharmacist Clinician (PhC)/ Clinical Pharmacy Specialist

## 2017-06-06 MED ORDER — EVOLOCUMAB 140 MG/ML ~~LOC~~ SOAJ: 140.0000 mg | SUBCUTANEOUS | 12 refills | Status: DC

## 2017-06-06 NOTE — Telephone Encounter (Signed)
repatha rx sent to [facility], indicated in note to use new [place] # to lower cost to patient.

## 2017-06-14 ENCOUNTER — Telehealth: Payer: Self-pay | Admitting: Cardiovascular Disease

## 2017-06-14 NOTE — Telephone Encounter (Signed)
[facility] calling,   States that they faxed a form for Pra[place] medication and asked that we fax it back to number listed on the form.

## 2017-06-16 ENCOUNTER — Telehealth: Payer: Self-pay | Admitting: *Deleted

## 2017-06-16 NOTE — Telephone Encounter (Signed)
Patient left a msg on the refill vm requesting a refill on amoxicillin 500 mg. He would like for this to [facility] to [facility] [address]. Call back number left, [phone]. Thanks, MI

## 2017-06-16 NOTE — Telephone Encounter (Signed)
Spoke with pt, he called the wrong office.

## 2017-07-06 ENCOUNTER — Telehealth: Payer: Self-pay | Admitting: Cardiovascular Disease

## 2017-07-06 NOTE — Telephone Encounter (Signed)
Called the patient as he was going to call his insurance company last week (06-30-17).  He says he still has to call the insurance company to find out how much the test will cost him.

## 2017-07-13 ENCOUNTER — Encounter: Payer: Self-pay | Admitting: Cardiovascular Disease

## 2017-07-22 ENCOUNTER — Telehealth (HOSPITAL_COMMUNITY): Payer: Self-pay

## 2017-07-22 NOTE — Telephone Encounter (Signed)
Encounter complete. 

## 2017-07-27 ENCOUNTER — Ambulatory Visit (HOSPITAL_COMMUNITY)
Admission: RE | Admit: 2017-07-27 | Discharge: 2017-07-27 | Disposition: A | Discharge status: Home / Self Care | Payer: Medicare Other | Source: Ambulatory Visit | Attending: Cardiovascular Disease | Admitting: Cardiovascular Disease

## 2017-07-27 DIAGNOSIS — I10 Essential (primary) hypertension: Secondary | ICD-10-CM | POA: Diagnosis not present

## 2017-07-27 DIAGNOSIS — I2510 Atherosclerotic heart disease of native coronary artery without angina pectoris: Secondary | ICD-10-CM | POA: Diagnosis not present

## 2017-07-27 DIAGNOSIS — E7849 Other hyperlipidemia: Secondary | ICD-10-CM | POA: Diagnosis not present

## 2017-07-27 LAB — MYOCARDIAL PERFUSION IMAGING
CHL CUP RESTING HR STRESS: 49 {beats}/min
CSEPHR: 100 %
CSEPPHR: 150 {beats}/min
Estimated workload: 6 METS
Exercise duration (min): 4 min
Exercise duration (sec): 55 s
LVDIAVOL: 114 mL (ref 62–150)
LVSYSVOL: 41 mL
MPHR: 150 {beats}/min
RPE: 19
SDS: 3
SRS: 2
SSS: 5
TID: 0.84

## 2017-07-27 MED ORDER — TECHNETIUM TC 99M TETROFOSMIN IV KIT
10.2000 | PACK | Freq: Once | INTRAVENOUS | Status: DC | PRN
  Filled 2017-07-27: qty 11

## 2017-07-27 MED ORDER — TECHNETIUM TC 99M TETROFOSMIN IV KIT
32.0000 | PACK | Freq: Once | INTRAVENOUS | Status: DC | PRN
Start: 2017-07-27 — End: 2017-07-28
  Filled 2017-07-27: qty 32

## 2017-08-11 ENCOUNTER — Encounter: Payer: Self-pay | Admitting: Cardiovascular Disease

## 2017-08-11 ENCOUNTER — Ambulatory Visit: Payer: Medicare Other | Admitting: Cardiovascular Disease

## 2017-08-11 VITALS — BP 120/74 | HR 72 | Ht 68.0 in | Wt 195.0 lb

## 2017-08-11 DIAGNOSIS — E7849 Other hyperlipidemia: Secondary | ICD-10-CM | POA: Diagnosis not present

## 2017-08-11 DIAGNOSIS — E785 Hyperlipidemia, unspecified: Secondary | ICD-10-CM

## 2017-08-11 DIAGNOSIS — I10 Essential (primary) hypertension: Secondary | ICD-10-CM

## 2017-08-11 DIAGNOSIS — I2510 Atherosclerotic heart disease of native coronary artery without angina pectoris: Secondary | ICD-10-CM

## 2017-08-11 NOTE — Patient Instructions (Signed)
Medication Instructions:  No medication changes    Follow-Up: Your physician wants you to follow-up in: 12 months with Dr. Dow Adolph will receive a reminder letter in the mail two months in advance. If you don't receive a letter, please call our office to schedule the follow-up appointment.      If you need a refill on your cardiac medications before your next appointment, please call your pharmacy.

## 2017-08-11 NOTE — Progress Notes (Signed)
Henry Morrison Morrison ID: Henry Morrison Henry Morrison Morrison, male   DOB: 1945-10-19, 72 y.o.   MRN: [id number]    Primary M.D.: Dr. Bevelyn Buckles  HPI: Henry Morrison Henry Morrison Morrison is a 72 y.o. male who presents to [facility] Morrison office for a 6 month cardiology evaluation.   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  At that time, he had told me that he had first been told of having high cholesterol approximatey 8 years previously with cholesterols in excess of 400, but he was not on any therapy.  He had undergone a routine treadmill test which reportely was normal by Dr. Sherald Barge.  Of note, his mother had very high cholesterols throughout her life and died of a myocardial infarction at age 68. When Henry Morrison Henry Morrison Morrison was initially referred to me, his laboratory was notable for a total cholesterol of 510 and LDL cholesterol of 422.  Since my initial evaluation, he was started on aspirin therapy as well as initial statin treatment with simvastatin..  As more aggressive statins became available, he ultimately was treated with atorvastatin 80 mg in addition to Zetia 10 mg. Of note a review of his medical record indicates that there was a short period of time when he was traveling excessively and had stopped taking therapy. Subsequent blood work in 1999 upon his return showed his cholesterol had risen back to 343 with an LDL cholesterol of 245.   A nuclear perfusion study in 2002 demonstrated new development of inferolateral ischemia. In June 2002 he underwent stenting of a subtotal occlusion of a circumflex vessel which time a 2.75x23 mm HepaCoat stent was inserted. He had mild concomitant CAD involving Henry Morrison LAD and right coronary artery which had been continued to be treated medically. A nuclear perfusion study in October 2011 continued to show normal  perfusion without scar or ischemia.   He remains active. 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  He has been maintained on atorvastatin 80 mg and Zetia 10 mg for his marked hyperlipidemia. His blood pressure has been treated with metoprolol succinate 25 mg as well as altace 10 mg. He also is on low dose isosorbide mononitrate and has not been experiencing any recurrent anginal symptoms.  Blood work done on 06/17/2014:  total cholesterol 194, triglycerides 72, HDL 66, and LDL cholesterol is 114. Liver tests are normal. Renal function is normal with a creatinine of 0.8.  Fasting glucose was 94.  Prior to undergoing knee replacement surgery  he underwent a nuclear perfusion study on 08/06/2014.  This remained low risk and did not demonstrate scar or ischemia.  He had normal LV function.  He underwent his knee replacement in January 2017 and has done well from a cardiovascular standpoint.  After seeing me in 2016, I had a long discussion with Henry Morrison Henry Morrison Morrison and felt he was a candidate for PCSK-9 inhibition therapy.  He has been on Prilosec when 75 mg every 14 days since Henry Morrison spring of 2016.  This has resulted in dramatic benefit and lab work on 12/24/2014 showed a total cholesterol of 150 with an LDL cholesterol of 43, and blood work on 04/14/2015 revealed a total cholesterol 141 and LDL cholesterol at 36.  HDL was 93, VLDL 12, and  triglycerides 59.  Subsequent laboratory continued to show improvement in LDL and in January 2018 LDL had reduced to 24.  He recently underwent subsequent blood work on 07/09/2016 which showed an LDL of 57.  He continues to be on atorvastatin 80 mg, Zetia 10 mg, and apparently is only on Henry Morrison reduced dose of Praluent at 75 mg every 2 weeks.   I last saw him, I recommended he increase probably went to 150 mg since his LDL had increased into Henry Morrison 40s.  Subsequent blood work.  It shows significant improvement with LDL back down to  24.  He is now fully retired.  He is staying active with his grandchildren and travels.  He denies recurrent chest pain, PND, orthopnea. He has just started on Medicare,  as result , as result, he now has to pay for PCS canine inhibition whereas previously his commercial insurance paid 100%.  Because of this and Henry Morrison recently reduced cost of Repatha, which would result in a lower Medicare co-pay cost he was switched to [place]. .  He has tolerated this well, but his co-pay is still over $100 per month.  He continues to be on concomitant therapy with atorvastatin 80 mg and Zetia 10 mg.  He is not having any anginal symptoms on isosorbide 30 mg and Toprol 25 mg.  Since I last saw me underwent a three-year f/u nuclear perfusion study on 03/18/2019which remained entirely normal.  He presents for reevaluation.  Past Medical History:  Diagnosis Date  . Arthritis   . CAD (coronary artery disease)   . Cancer ([place])    skin cancer   . Hyperlipidemia   . Primary localized osteoarthritis of right knee 06/09/2015    Past Surgical History:  Procedure Laterality Date  . COLONOSCOPY    . CORONARY ANGIOPLASTY WITH STENT PLACEMENT  10/2000   stenting of Henry Morrison CX vessel  . KNEE ARTHROSCOPY W/ MENISCECTOMY Left 07/15/2006  . KNEE ARTHROSCOPY W/ MENISCECTOMY Right 02/09/2012  . removal of skin cancer     right arm  . TOTAL KNEE ARTHROPLASTY Right 06/09/2015   Procedure: RIGHT TOTAL KNEE ARTHROPLASTY;  Surgeon: Elsie Saas, MD;  Location: [facility];  Service: Orthopedics;  Laterality: Right;  . US ECHOCARDIOGRAPHY  03/07/2012   Trace AI,mild MR,mild to mod TR    Allergies  Allergen Reactions  . Other     Seasonal allergies    Current Outpatient Medications  Medication Sig Dispense Refill  . acetaminophen (TYLENOL) 500 MG tablet Take 1,000 mg by mouth daily as needed for moderate pain.    Marland Kitchen atorvastatin (LIPITOR) 80 MG tablet take 1 tablet by mouth once daily 30 tablet 11  . Evolocumab (REPATHA SURECLICK) 299  MG/ML SOAJ Inject 140 mg into Henry Morrison skin every 14 (fourteen) days. 2 pen 12  . ezetimibe (ZETIA) 10 MG tablet take 1 tablet by mouth once daily 30 tablet 11  . fluticasone (FLONASE) 50 MCG/ACT nasal spray Place 1 spray into both nostrils as needed for allergies.     . isosorbide mononitrate (IMDUR) 30 MG 24 hr tablet take 1 tablet by mouth once daily 30 tablet 11  . metoprolol succinate (TOPROL-XL) 25 MG 24 hr tablet take 1 tablet by mouth once daily 30 tablet 11  . Multiple Vitamin (MULTIVITAMIN) tablet Take 1 tablet by mouth daily.    . ramipril (ALTACE) 10 MG capsule take 1 capsule by mouth once daily 30 capsule 11   No current facility-administered medications for this visit.  Social History   Socioeconomic History  . Marital status: Married    Spouse name: Not on file  . Number of children: Not on file  . Years of education: Not on file  . Highest education level: Not on file  Occupational History  . Not on file  Social Needs  . Financial resource strain: Not on file  . Food insecurity:    Worry: Not on file    Inability: Not on file  . Transportation needs:    Medical: Not on file    Non-medical: Not on file  Tobacco Use  . Smoking status: Never Smoker  . Smokeless tobacco: Never Used  Substance and Sexual Activity  . Alcohol use: Yes    Alcohol/week: 6.0 oz    Types: 10 Standard drinks or equivalent per week    Comment: wine 2 galsses each day  . Drug use: No  . Sexual activity: Not on file  Lifestyle  . Physical activity:    Days per week: Not on file    Minutes per session: Not on file  . Stress: Not on file  Relationships  . Social connections:    Talks on phone: Not on file    Gets together: Not on file    Attends religious service: Not on file    Active member of club or organization: Not on file    Attends meetings of clubs or organizations: Not on file    Relationship status: Not on file  . Intimate partner violence:    Fear of current or ex  partner: Not on file    Emotionally abused: Not on file    Physically abused: Not on file    Forced sexual activity: Not on file  Other Topics Concern  . Not on file  Social History Narrative  . Not on file    Family History  Problem Relation Age of Onset  . Heart disease Mother   . Cancer Father        kidney    ROS General: Negative; No fevers, chills, or night sweats;  HEENT: Negative; No changes in vision or hearing, sinus congestion, difficulty swallowing Pulmonary: Negative; No cough, wheezing, shortness of breath, hemoptysis Cardiovascular: Negative; No chest pain, presyncope, syncope, palpitations GI: Negative; No nausea, vomiting, diarrhea, or abdominal pain GU: Negative; No dysuria, hematuria, or difficulty voiding Musculoskeletal: Negative; no myalgias, joint pain, or weakness Hematologic/Oncology: Negative; no easy bruising, bleeding Endocrine: Negative; no heat/cold intolerance; no diabetes Neuro: Negative; no changes in balance, headaches Skin:  History of melanoma resection Psychiatric: Negative; No behavioral problems, depression Sleep: Negative; No snoring, daytime sleepiness, hypersomnolence, bruxism, restless legs, hypnogognic hallucinations, no cataplexy Other comprehensive 14 point system review is negative.   PE BP 120/74 (BP Location: Left Arm, Henry Morrison Morrison Position: Sitting, Cuff Size: Normal)   Pulse 72   Ht _0  (1.727 m)   Wt 195 lb (88.5 kg)   BMI 29.65 kg/m    epeat blood pressure by me 130/76  Wt Readings from Last 3 Encounters:  08/11/17 195 lb (88.5 kg)  07/27/17 199 lb (90.3 kg)  02/02/17 199 lb (90.3 kg)   General: Alert, oriented, no distress.  Skin: normal turgor, no rashes, warm and dry HEENT: Normocephalic, atraumatic. Pupils equal round and reactive to light; sclera anicteric; extraocular muscles intact;  Nose without nasal septal hypertrophy Mouth/Parynx benign; Mallinpatti scale 2 Neck: No JVD, no carotid bruits; normal  carotid upstroke Lungs: clear to ausculatation and percussion; no wheezing or rales  Chest wall: without tenderness to palpitation Heart: PMI not displaced, RRR, s1 s2 normal, 1/6 systolic murmur, no diastolic murmur, no rubs, gallops, thrills, or heaves Abdomen: soft, nontender; no hepatosplenomehaly, BS+; abdominal aorta nontender and not dilated by palpation. Back: no CVA tenderness Pulses 2+ Musculoskeletal: full range of motion, normal strength, no joint deformities Extremities: no clubbing cyanosis or edema, Homan's sign negative  Neurologic: grossly nonfocal; Cranial nerves grossly wnl Psychologic: Normal mood and affect   ECG (independently read by me): sinus bradycardia 54 bpm.  Normal intervals.  No ST segment changes.  July 2017 ECG (independently read by me): Normal sinus rhythm at 78 bpm.  Normal intervals.  No significant ST segment changes.  February 2016 ECG (independently read by me): Sinus bradycardia 54 bpm.  Normal intervals.  No ST segment changes.  January 2015ECG (independently read by me): Normal sinus rhythm. No significant ST-T changes. PoorR wave progression.  LABS: BMP Latest Ref Rng & Units 05/19/2016 06/11/2015 06/10/2015  Glucose 65 - 99 mg/dL 99 170(H) 234(H)  BUN 7 - 25 mg/dL 22 24(H) 24(H)  Creatinine 0.70 - 1.25 mg/dL 0.90 0.97 1.11  Sodium 135 - 146 mmol/L 140 139 138  Potassium 3.5 - 5.3 mmol/L 4.4 4.9 4.5  Chloride 98 - 110 mmol/L 107 101 102  CO2 20 - 31 mmol/L _0 Calcium 8.6 - 10.3 mg/dL 9.3 8.1(L) 8.8(L)   Hepatic Function Latest Ref Rng & Units 05/19/2016 06/02/2015 04/14/2015  Total Protein 6.1 - 8.1 g/dL 6.4 6.6 6.8  Albumin 3.6 - 5.1 g/dL 4.2 4.0 4.3  AST 10 - 35 U/L _1 ALT 9 - 46 U/L 34 33 42  Alk Phosphatase 40 - 115 U/L 47 53 57  Total Bilirubin 0.2 - 1.2 mg/dL 0.9 0.8 0.8  Bilirubin, Direct <=0.2 mg/dL - - 0.2   CBC Latest Ref Rng & Units 05/19/2016 06/11/2015 06/10/2015  WBC 3.8 - 10.8 K/uL 6.1 15.7(H) 16.6(H)  Hemoglobin  13.2 - 17.1 g/dL 13.5 9.8(L) 11.4(L)  Hematocrit 38.5 - 50.0 % 41.9 30.3(L) 33.8(L)  Platelets 140 - 400 K/uL 209 201 206   Lab Results  Component Value Date   MCV 93.1 05/19/2016   MCV 92.7 06/11/2015   MCV 92.3 06/10/2015   No results found for: TSH  Lab Results  Component Value Date   HGBA1C 5.9 (H) 06/11/2015   Lipid Panel     Component Value Date/Time   CHOL 105 05/03/2017 0947   TRIG 69 05/03/2017 0947   HDL 67 05/03/2017 0947   CHOLHDL 1.6 05/03/2017 0947   CHOLHDL 1.4 05/19/2016 0807   VLDL 13 05/19/2016 0807   LDLCALC 24 05/03/2017 0947   Blood work by Dr. Sharlett Iles of [facility] from 07/09/2016 was reviewed Cholesterol 125, triglycerides 71, HDL 54, LDL 57.  I reviewed most recent laboratory from [place] collected on 03/12/2018.  Total cholesterol was 125, triglycerides 86, HDL 66, LDL 42.   IMPRESSION: 1. Familial hyperlipidemia, high LDL   2. Essential hypertension   3. Coronary artery disease involving native coronary artery of native heart without angina pectoris   4. Hyperlipidemia LDL goal <70     ASSESSMENT AND PLAN: Henry Morrison Henry Morrison Morrison is a 72 year old gentleman who has familial hyperlipidemia with initial cholesterols in excess of 500 and LDL cholesterols in excess of 400. Over Henry Morrison last 20 years, I have been aggressively managing his lipid studies.  In 2002 he was found to have a subtotally occluded obtuse  marginal 1 stenosis after nuclear study suggested inferolateral ischemia. Underwent successful intervention with stenting of a subtotally occluded circumflex vessel. There was concomitant CAD, also involving his LAD and RCA.  A nuclear study done in 2016 for preoperative clearance prior to his knee surgery revealed continued normal perfusion without scar or ischemia. I reviewed his 3 year follow-up nuclear study with him in detail today.  This was entirely normal. Based on Henry Morrison [place] diagnostic criteria he has familial  hypercholesterolemia with a score of at least 13.  He has been on PCSK9 inhibition since 2016.  He has continued to take atorvastatin 80 mg and Zetia 10 mg.  .  Since starting therapy, his LDL cholesterols have consistently been under 50 and had been in Henry Morrison mid 20s on several occasions.  He was recently switched to [place] due to cost since he now has to pay Henry Morrison co-pay Medicare expense. Marland Kitchen  He is currently receiving Repatha 140 mg every 2 weeks.  He is not having any anginal symptomatology on his current medical regimen.  His blood pressure today is controlled on ramipril 10 mg, isosorbide, in addition to metoprolol.  He will be traveling extensively throughout Henry Morrison year.  He feels great.  He is exercising daily.  As long as he remains stable I will see him in one year for reevaluation.   Troy Sine, MD, [facility]  08/12/2017 6:17 PM

## 2017-08-12 ENCOUNTER — Encounter: Payer: Self-pay | Admitting: Cardiovascular Disease

## 2017-12-13 ENCOUNTER — Other Ambulatory Visit: Payer: Self-pay | Admitting: Pharmacist Clinician (PhC)/ Clinical Pharmacy Specialist

## 2017-12-13 ENCOUNTER — Telehealth: Payer: Self-pay | Admitting: Pharmacist Clinician (PhC)/ Clinical Pharmacy Specialist

## 2017-12-13 DIAGNOSIS — E7849 Other hyperlipidemia: Secondary | ICD-10-CM

## 2017-12-13 NOTE — Telephone Encounter (Signed)
repatha PA expired.  Pt to get labs for renewal.

## 2017-12-14 LAB — HEPATIC FUNCTION PANEL
ALK PHOS: 76 IU/L (ref 39–117)
ALT: 27 IU/L (ref 0–44)
AST: 20 IU/L (ref 0–40)
Albumin: 4.5 g/dL (ref 3.5–4.8)
Bilirubin Total: 0.7 mg/dL (ref 0.0–1.2)
Bilirubin, Direct: 0.22 mg/dL (ref 0.00–0.40)
Total Protein: 6.7 g/dL (ref 6.0–8.5)

## 2017-12-14 LAB — LIPID PANEL
Chol/HDL Ratio: 1.6 ratio (ref 0.0–5.0)
Cholesterol, Total: 128 mg/dL (ref 100–199)
HDL: 79 mg/dL (ref >39)
LDL Calculated: 34 mg/dL (ref 0–99)
TRIGLYCERIDES: 73 mg/dL (ref 0–149)
VLDL Cholesterol Cal: 15 mg/dL (ref 5–40)

## 2018-01-31 ENCOUNTER — Other Ambulatory Visit: Payer: Self-pay

## 2018-01-31 ENCOUNTER — Emergency Department (HOSPITAL_COMMUNITY)
Admission: EM | Admit: 2018-01-31 | Discharge: 2018-01-31 | Disposition: A | Discharge status: Home / Self Care | Payer: Medicare Other | Source: Home / Self Care | Attending: Emergency Medicine | Admitting: Emergency Medicine

## 2018-01-31 ENCOUNTER — Emergency Department (HOSPITAL_COMMUNITY): Payer: Medicare Other

## 2018-01-31 ENCOUNTER — Encounter (HOSPITAL_COMMUNITY): Payer: Self-pay | Admitting: Emergency Medicine

## 2018-01-31 DIAGNOSIS — R748 Abnormal levels of other serum enzymes: Secondary | ICD-10-CM

## 2018-01-31 DIAGNOSIS — I2510 Atherosclerotic heart disease of native coronary artery without angina pectoris: Secondary | ICD-10-CM

## 2018-01-31 DIAGNOSIS — I10 Essential (primary) hypertension: Secondary | ICD-10-CM

## 2018-01-31 DIAGNOSIS — R202 Paresthesia of skin: Secondary | ICD-10-CM

## 2018-01-31 DIAGNOSIS — R200 Anesthesia of skin: Secondary | ICD-10-CM | POA: Insufficient documentation

## 2018-01-31 DIAGNOSIS — Z96651 Presence of right artificial knee joint: Secondary | ICD-10-CM

## 2018-01-31 DIAGNOSIS — G610 Guillain-Barre syndrome: Secondary | ICD-10-CM | POA: Diagnosis not present

## 2018-01-31 DIAGNOSIS — R531 Weakness: Secondary | ICD-10-CM | POA: Diagnosis not present

## 2018-01-31 DIAGNOSIS — Z79899 Other long term (current) drug therapy: Secondary | ICD-10-CM

## 2018-01-31 LAB — URINALYSIS, ROUTINE W REFLEX MICROSCOPIC
BILIRUBIN URINE: NEGATIVE
Bacteria, UA: NONE SEEN
GLUCOSE, UA: NEGATIVE mg/dL
KETONES UR: NEGATIVE mg/dL
LEUKOCYTES UA: NEGATIVE
NITRITE: NEGATIVE
PROTEIN: NEGATIVE mg/dL
Specific Gravity, Urine: 1.015 (ref 1.005–1.030)
pH: 5 (ref 5.0–8.0)

## 2018-01-31 LAB — BASIC METABOLIC PANEL
ANION GAP: 8 (ref 5–15)
BUN: 13 mg/dL (ref 8–23)
CALCIUM: 10 mg/dL (ref 8.9–10.3)
CHLORIDE: 107 mmol/L (ref 98–111)
CO2: 27 mmol/L (ref 22–32)
CREATININE: 0.92 mg/dL (ref 0.61–1.24)
GFR calc non Af Amer: >60 mL/min (ref >60)
Glucose, Bld: 100 mg/dL — ABNORMAL HIGH (ref 70–99)
Potassium: 4.9 mmol/L (ref 3.5–5.1)
SODIUM: 142 mmol/L (ref 135–145)

## 2018-01-31 LAB — CBC
HCT: 43.7 % (ref 39.0–52.0)
HEMOGLOBIN: 13.8 g/dL (ref 13.0–17.0)
MCH: 29.9 pg (ref 26.0–34.0)
MCHC: 31.6 g/dL (ref 30.0–36.0)
MCV: 94.6 fL (ref 78.0–100.0)
PLATELETS: 273 10*3/uL (ref 150–400)
RBC: 4.62 MIL/uL (ref 4.22–5.81)
RDW: 13.7 % (ref 11.5–15.5)
WBC: 11.3 10*3/uL — AB (ref 4.0–10.5)

## 2018-01-31 LAB — CK: CK TOTAL: 604 U/L — AB (ref 49–397)

## 2018-01-31 NOTE — Discharge Instructions (Addendum)
Please continue to hydrate orally and follow up with your primary care provider as noted.

## 2018-01-31 NOTE — ED Provider Notes (Signed)
MSE was initiated and I personally evaluated the patient and placed orders (if any) at  5:40 PM on January 31, 2018.  The patient appears stable so that the remainder of the MSE may be completed by another provider.  Patient placed in Quick Look pathway, seen and evaluated   Chief Complaint: weakness, dizziness, numbness in hands  HPI:   72yo M with PMH of CAD, HTN, HLD, presents to ED for evaluation of 3 day history of dizziness, bilateral leg "heaviness" and numbness in bilateral fingertips. He is unsure if this is related to the work he did outside in the heat for several hours on Thursday or the influenza vaccine he received Friday. Reports having difficulty walking upstairs 2/2 discomfort in legs. No injuries or falls. Does admit he does not stay hydrated. No head injuries or falls. Denies chest pain, SOB or pain anywhere  ROS: generalized weakness  (one)  Physical Exam:   Gen: No distress  Neuro: Awake and Alert  Skin: Warm    Focused Exam: PERRL. No facial asymmetry noted. Strength 5/5 in BUE, BLE. Normal sensation of face and extremities.    Initiation of care has begun. The patient has been counseled on the process, plan, and necessity for staying for the completion/evaluation, and the remainder of the medical screening examination    Delia Heady, PA-C 01/31/18 1747    Pattricia Boss, MD 02/02/18 [phone]

## 2018-01-31 NOTE — ED Provider Notes (Signed)
[facility] EMERGENCY DEPARTMENT Provider Note  CSN: [id number] Arrival date & time: 01/31/18 1718  Chief Complaint(s) Weakness and Numbness  HPI Henry Morrison is a 72 y.o. male with a history of hypertension, hyperlipidemia on statin who presents to the emergency department with 3 days of bilateral foot paresthesias described as "numbness."  Patient reports that symptoms began 3 days ago.  Symptoms initially were mild however, later on in the day him symptoms worsen.  Since then they have been stable.  He is endorsing associated dizziness which is actually unsteadiness due to the foot numbness.  At the same time, patient also endorsed numbness to bilateral fingertips.  He denied any focal deficits.  He denies any overt weakness.  States that earlier in the day he was out in the sun, for several hours watching family members play soccer.  He states that he typically does not hydrate well.  Additionally patient reported that the day prior to symptom onset, he received his influenza vaccine.  He denies any head injuries or falls.  Denies any associated chest pain or shortness of breath.  No nausea or vomiting.  HPI  Past Medical History Past Medical History:  Diagnosis Date  . Arthritis   . CAD (coronary artery disease)   . Cancer ([place])    skin cancer   . Hyperlipidemia   . Primary localized osteoarthritis of right knee 06/09/2015   Patient Active Problem List   Diagnosis Date Noted  . Primary localized osteoarthritis of right knee 06/09/2015  . DJD (degenerative joint disease) of knee 06/09/2015  . Preoperative clearance 07/06/2014  . CAD (coronary artery disease) 06/14/2013  . Familial hyperlipidemia, high LDL 06/14/2013  . Essential hypertension 06/14/2013  . Melanoma ([place]) 06/14/2013   Home Medication(s) Prior to Admission medications   Medication Sig Start Date End Date Taking? Authorizing Provider  acetaminophen (TYLENOL) 500 MG tablet Take 1,000 mg by mouth  daily as needed for moderate pain.    [provider]  atorvastatin (LIPITOR) 80 MG tablet take 1 tablet by mouth once daily 02/18/17   Troy Sine, MD  Evolocumab (REPATHA SURECLICK) 474 MG/ML SOAJ Inject 140 mg into the skin every 14 (fourteen) days. 06/06/17   Troy Sine, MD  ezetimibe (ZETIA) 10 MG tablet take 1 tablet by mouth once daily 02/18/17   Troy Sine, MD  fluticasone [facility]) 50 MCG/ACT nasal spray Place 1 spray into both nostrils as needed for allergies.  05/24/13   [provider]  isosorbide mononitrate (IMDUR) 30 MG 24 hr tablet take 1 tablet by mouth once daily 02/18/17   Troy Sine, MD  metoprolol succinate (TOPROL-XL) 25 MG 24 hr tablet take 1 tablet by mouth once daily 02/18/17   Troy Sine, MD  Multiple Vitamin (MULTIVITAMIN) tablet Take 1 tablet by mouth daily.    [provider]  ramipril (ALTACE) 10 MG capsule take 1 capsule by mouth once daily 02/18/17   Troy Sine, MD  Past Surgical History Past Surgical History:  Procedure Laterality Date  . COLONOSCOPY    . CORONARY ANGIOPLASTY WITH STENT PLACEMENT  10/2000   stenting of the CX vessel  . KNEE ARTHROSCOPY W/ MENISCECTOMY Left 07/15/2006  . KNEE ARTHROSCOPY W/ MENISCECTOMY Right 02/09/2012  . removal of skin cancer     right arm  . TOTAL KNEE ARTHROPLASTY Right 06/09/2015   Procedure: RIGHT TOTAL KNEE ARTHROPLASTY;  Surgeon: Elsie Saas, MD;  Location: [place];  Service: Orthopedics;  Laterality: Right;  . US ECHOCARDIOGRAPHY  03/07/2012   Trace AI,mild MR,mild to mod TR   Family History Family History  Problem Relation Age of Onset  . Heart disease Mother   . Cancer Father        kidney    Social History Social History   Tobacco Use  . Smoking status: Never Smoker  . Smokeless tobacco: Never Used  Substance Use Topics  .  Alcohol use: Yes    Alcohol/week: 10.0 standard drinks    Types: 10 Standard drinks or equivalent per week    Comment: wine 2 galsses each day  . Drug use: No   Allergies Other  Review of Systems Review of Systems All other systems are reviewed and are negative for acute change except as noted in the HPI  Physical Exam Vital Signs  I have reviewed the triage vital signs BP (!) 147/77   Pulse (!) 58   Temp 98.1 F (36.7 C) (Oral)   Resp 16   Ht 5\' 11"  (1.803 m)   Wt 83.9 kg   SpO2 97%   BMI 25.80 kg/m   Physical Exam  Constitutional: He is oriented to person, place, and time. He appears well-developed and well-nourished. No distress.  HENT:  Head: Normocephalic and atraumatic.  Nose: Nose normal.  Eyes: Pupils are equal, round, and reactive to light. Conjunctivae and EOM are normal. Right eye exhibits no discharge. Left eye exhibits no discharge. No scleral icterus.  Neck: Normal range of motion. Neck supple.  Cardiovascular: Normal rate and regular rhythm. Exam reveals no gallop and no friction rub.  No murmur heard. Pulses:      Dorsalis pedis pulses are 2+ on the right side, and 1+ on the left side.       Posterior tibial pulses are 2+ on the right side, and 1+ on the left side.  Pulmonary/Chest: Effort normal and breath sounds normal. No stridor. No respiratory distress. He has no rales.  Abdominal: Soft. He exhibits no distension. There is no tenderness.  Musculoskeletal: He exhibits no edema or tenderness.  Neurological: He is alert and oriented to person, place, and time.  Mental Status:  Alert and oriented to person, place, and time.  Attention and concentration normal.  Speech clear.  Recent memory is intact  Cranial Nerves:  II Visual Fields: Intact to confrontation. Visual fields intact. III, IV, VI: Pupils equal and reactive to light and near. Full eye movement without nystagmus  V Facial Sensation: Normal. No weakness of masticatory muscles  VII: No  facial weakness or asymmetry  VIII Auditory Acuity: Grossly normal  IX/X: The uvula is midline; the palate elevates symmetrically  XI: Normal sternocleidomastoid and trapezius strength  XII: The tongue is midline. No atrophy or fasciculations.   Motor System: Muscle Strength: 5/5 and symmetric in the upper and lower extremities. No pronation or drift.  Muscle Tone: Tone and muscle bulk are normal in the upper and lower extremities.   Reflexes: DTRs: 1+  and symmetrical in all four extremities. No Clonus Coordination: Intact finger-to-nose, heel-to-shin. No tremor.  Sensation: Intact to light touch, and pinprick.  Gait: Routine gait normal.   Skin: Skin is warm and dry. No rash noted. He is not diaphoretic. No erythema.  Psychiatric: He has a normal mood and affect.  Vitals reviewed.   ED Results and Treatments Labs (all labs ordered are listed, but only abnormal results are displayed) Labs Reviewed  BASIC METABOLIC PANEL - Abnormal; Notable for the following components:      Result Value   Glucose, Bld 100 (*)    All other components within normal limits  CBC - Abnormal; Notable for the following components:   WBC 11.3 (*)    All other components within normal limits  URINALYSIS, ROUTINE W REFLEX MICROSCOPIC - Abnormal; Notable for the following components:   Hgb urine dipstick SMALL (*)    All other components within normal limits  CK - Abnormal; Notable for the following components:   Total CK 604 (*)    All other components within normal limits  CBG MONITORING, ED                                                                                                                         EKG  EKG Interpretation  Date/Time:  Tuesday January 31 2018 17:25:59 EDT Ventricular Rate:  74 PR Interval:  152 QRS Duration: 78 QT Interval:  380 QTC Calculation: 421 R Axis:   -46 Text Interpretation:  Normal sinus rhythm Left anterior fascicular block Cannot rule out Anterior infarct  , age undetermined Abnormal ECG Confirmed by Virgel Manifold [phone]) on 01/31/2018 10:23:24 PM      Radiology Ct Head Wo Contrast  Result Date: 01/31/2018 CLINICAL DATA:  Dizziness/vertigo. Difficulty walking. Symptoms for several days. No reported injury. EXAM: CT HEAD WITHOUT CONTRAST TECHNIQUE: Contiguous axial images were obtained from the base of the skull through the vertex without intravenous contrast. COMPARISON:  None. FINDINGS: Brain: No evidence of parenchymal hemorrhage or extra-axial fluid collection. No mass lesion, mass effect, or midline shift. No CT evidence of acute infarction. Cerebral volume is age appropriate. No ventriculomegaly. Vascular: No acute abnormality. Skull: No evidence of calvarial fracture. Sinuses/Orbits: Prominent mucoperiosteal thickening throughout the right maxillary sinus. No definite fluid levels. Other:  The mastoid air cells are unopacified. IMPRESSION: 1.  No evidence of acute intracranial abnormality. 2. Chronic appearing right maxillary sinusitis. Electronically Signed   By: Ilona Sorrel M.D.   On: 01/31/2018 18:33   Pertinent labs & imaging results that were available during my care of the patient were reviewed by me and considered in my medical decision making (see chart for details).  Medications Ordered in ED Medications - No data to display  Procedures Procedures  (including critical care time)  Medical Decision Making / ED Course I have reviewed the nursing notes for this encounter and the patient's prior records (if available in EHR or on provided paperwork).    Patient seen in first look process and appropriate screening labs obtain, revealing no significant electrolyte derangements such as hyponatremia, hypokalemia, hypocalcemia.  No renal insufficiency.  No anemia.  CK was elevated around 600.  CT of the head  without evidence of ICH or mass-effect.  On exam there is no focal deficits.  Patient has intact sensation and strength.  Stable ambulation.  Pulses are asymmetric in the lower extremities but I do not feel this is contributing to his symptomatology.  I did recommend evaluation for peripheral vascular disease.  Also considered possible Bayard Males, however this is unlikely given the lack of weakness. Most likely consistent with rhabdo from either heat exhaustion or influenza vaccine.  Patient is also on statins and will require close follow-up with primary care provider to determine whether statin is contributing to his symptoms.  Low suspicion for CVA.  Patient does have a history of melanoma but has no symptoms concerning for cauda equina.  No additional work-up or imaging necessary at this time.  The patient appears reasonably screened and/or stabilized for discharge and I doubt any other medical condition or other [facility] requiring further screening, evaluation, or treatment in the ED at this time prior to discharge.  The patient is safe for discharge with strict return precautions.     Final Clinical Impression(s) / ED Diagnoses Final diagnoses:  Paresthesia  Numbness  Elevated CK-MB level   Disposition: Discharge  Condition: Good  I have discussed the results, Dx and Tx plan with the patient who expressed understanding and agree(s) with the plan. Discharge instructions discussed at great length. The patient was given strict return precautions who verbalized understanding of the instructions. No further questions at time of discharge.    ED Discharge Orders    None       Follow Up: Leanna Battles, [place] [address] [phone]  Schedule an appointment as soon as possible for a visit  in 5-7 days for close follow-up to recheck your CK level to set up evaluation for peripheral vascular disease, and to reassess for your foot numbness      This  chart was dictated using voice recognition software.  Despite best efforts to proofread,  errors can occur which can change the documentation meaning.   Fatima Blank, MD 01/31/18 [phone]

## 2018-01-31 NOTE — ED Triage Notes (Addendum)
Pt reports weakness in legs, dizziness upon position changes, bilateral numbness in feet and hands and increased difficulty in walking that started Saturday night and has gotten worse. Pt denies any falls or injuries, has been using a cane. Denies sob, cp, or any pain. No other neuro sx in triage.

## 2018-02-02 ENCOUNTER — Emergency Department (HOSPITAL_COMMUNITY)
Admission: EM | Admit: 2018-02-02 | Discharge: 2018-02-02 | Discharge status: Against Medical Advice | Payer: Medicare Other | Source: Home / Self Care | Attending: Emergency Medicine | Admitting: Emergency Medicine

## 2018-02-02 ENCOUNTER — Encounter (HOSPITAL_COMMUNITY): Payer: Self-pay | Admitting: Emergency Medicine

## 2018-02-02 ENCOUNTER — Other Ambulatory Visit: Payer: Self-pay

## 2018-02-02 DIAGNOSIS — R2681 Unsteadiness on feet: Secondary | ICD-10-CM

## 2018-02-02 DIAGNOSIS — R200 Anesthesia of skin: Secondary | ICD-10-CM

## 2018-02-02 DIAGNOSIS — Z5329 Procedure and treatment not carried out because of patient's decision for other reasons: Secondary | ICD-10-CM

## 2018-02-02 NOTE — ED Triage Notes (Signed)
Pt reports having numbness in bilateral legs and hands since Saturday. Pt was seen here 2 days ago, followed up with PCP today and sent here to be seen by Dr. Lorraine Lax with neurology for spinal tap. Pt had a flu shot 9/13, PCP concerned for [facility]

## 2018-02-02 NOTE — ED Provider Notes (Signed)
Patient placed in Quick Look pathway, seen and evaluated   Chief Complaint: lower extremity numbness  HPI:  ZYIR GASSERT is a 72 y.o. male who reports having numbness in bilateral legs and hands since Saturday. Pt was seen here 2 days ago, followed up with PCP today and sent here to be seen by Dr. Lorraine Lax with neurology for spinal tap. Pt had a flu shot 9/13, PCP concerned for [facility]. Patient evaluated here 9/17.19 and symptoms have worsened since that time.   ROS: neuro: lower extremity numbness  Physical Exam:  BP (!) 147/92   Pulse 72   Temp 99 F (37.2 C) (Oral)   Resp 20   SpO2 99%    Gen: No distress  Neuro: Awake and Alert  Skin: Warm and dry  Initiation of care has begun. The patient has been counseled on the process, plan, and necessity for staying for the completion/evaluation, and the remainder of the medical screening examination    Ashley Murrain, NP 02/02/18 [place], [place], MD 02/03/18 [phone]

## 2018-02-02 NOTE — ED Notes (Signed)
RN walked in to assess patient; Patient denies pain but was instant that he gets a Spinal ASAP by Dr.Auroa; pt was advised that Dr. Aaron Mose is not on call but neurologist on call will be consulted based on EDP assessment; pt was very upset states he will just get procedure done out patient and demanded to leave; pt is very unsteady on feet and uses cane but almost fell 3 times attempting to get into wheelchair; Patient wife left ED with patient-Monique,RN

## 2018-02-02 NOTE — ED Notes (Signed)
Pt's wife came up to the desk questioning the wait time. Advised the pt's wife that there were 2 patient's ahead of him and it would be a few minutes before he got a room. Wife advised if the patient does not get a room soon then they will be leaving.

## 2018-02-03 ENCOUNTER — Inpatient Hospital Stay (HOSPITAL_COMMUNITY)
Admission: EM | Admit: 2018-02-03 | Discharge: 2018-02-09 | DRG: 095 | Disposition: A | Discharge status: Home / Self Care | Payer: Medicare Other | Attending: Internal Medicine | Admitting: Internal Medicine

## 2018-02-03 ENCOUNTER — Encounter (HOSPITAL_COMMUNITY): Payer: Self-pay

## 2018-02-03 DIAGNOSIS — Y93E1 Activity, personal bathing and showering: Secondary | ICD-10-CM

## 2018-02-03 DIAGNOSIS — G909 Disorder of the autonomic nervous system, unspecified: Secondary | ICD-10-CM | POA: Diagnosis present

## 2018-02-03 DIAGNOSIS — I10 Essential (primary) hypertension: Secondary | ICD-10-CM | POA: Diagnosis not present

## 2018-02-03 DIAGNOSIS — Z85828 Personal history of other malignant neoplasm of skin: Secondary | ICD-10-CM | POA: Diagnosis not present

## 2018-02-03 DIAGNOSIS — T50B95A Adverse effect of other viral vaccines, initial encounter: Secondary | ICD-10-CM | POA: Diagnosis present

## 2018-02-03 DIAGNOSIS — M7989 Other specified soft tissue disorders: Secondary | ICD-10-CM | POA: Diagnosis not present

## 2018-02-03 DIAGNOSIS — R739 Hyperglycemia, unspecified: Secondary | ICD-10-CM | POA: Diagnosis present

## 2018-02-03 DIAGNOSIS — M1710 Unilateral primary osteoarthritis, unspecified knee: Secondary | ICD-10-CM | POA: Diagnosis not present

## 2018-02-03 DIAGNOSIS — I2510 Atherosclerotic heart disease of native coronary artery without angina pectoris: Secondary | ICD-10-CM | POA: Diagnosis present

## 2018-02-03 DIAGNOSIS — E785 Hyperlipidemia, unspecified: Secondary | ICD-10-CM | POA: Diagnosis present

## 2018-02-03 DIAGNOSIS — Z8051 Family history of malignant neoplasm of kidney: Secondary | ICD-10-CM | POA: Diagnosis not present

## 2018-02-03 DIAGNOSIS — Z955 Presence of coronary angioplasty implant and graft: Secondary | ICD-10-CM

## 2018-02-03 DIAGNOSIS — R7303 Prediabetes: Secondary | ICD-10-CM

## 2018-02-03 DIAGNOSIS — M5416 Radiculopathy, lumbar region: Secondary | ICD-10-CM | POA: Diagnosis present

## 2018-02-03 DIAGNOSIS — I083 Combined rheumatic disorders of mitral, aortic and tricuspid valves: Secondary | ICD-10-CM | POA: Diagnosis present

## 2018-02-03 DIAGNOSIS — G610 Guillain-Barre syndrome: Principal | ICD-10-CM | POA: Diagnosis present

## 2018-02-03 DIAGNOSIS — Z8249 Family history of ischemic heart disease and other diseases of the circulatory system: Secondary | ICD-10-CM

## 2018-02-03 DIAGNOSIS — Z96651 Presence of right artificial knee joint: Secondary | ICD-10-CM | POA: Diagnosis present

## 2018-02-03 DIAGNOSIS — E871 Hypo-osmolality and hyponatremia: Secondary | ICD-10-CM | POA: Diagnosis present

## 2018-02-03 DIAGNOSIS — E7849 Other hyperlipidemia: Secondary | ICD-10-CM | POA: Diagnosis present

## 2018-02-03 DIAGNOSIS — M179 Osteoarthritis of knee, unspecified: Secondary | ICD-10-CM | POA: Diagnosis present

## 2018-02-03 DIAGNOSIS — W182XXA Fall in (into) shower or empty bathtub, initial encounter: Secondary | ICD-10-CM | POA: Diagnosis present

## 2018-02-03 DIAGNOSIS — R7989 Other specified abnormal findings of blood chemistry: Secondary | ICD-10-CM | POA: Diagnosis not present

## 2018-02-03 DIAGNOSIS — R531 Weakness: Secondary | ICD-10-CM | POA: Diagnosis present

## 2018-02-03 LAB — COMPREHENSIVE METABOLIC PANEL
ALBUMIN: 4 g/dL (ref 3.5–5.0)
ALT: 44 U/L (ref 0–44)
ANION GAP: 13 (ref 5–15)
AST: 36 U/L (ref 15–41)
Alkaline Phosphatase: 74 U/L (ref 38–126)
BUN: 22 mg/dL (ref 8–23)
CALCIUM: 10.4 mg/dL — AB (ref 8.9–10.3)
CO2: 22 mmol/L (ref 22–32)
Chloride: 105 mmol/L (ref 98–111)
Creatinine, Ser: 0.92 mg/dL (ref 0.61–1.24)
GFR calc non Af Amer: >60 mL/min (ref >60)
GLUCOSE: 183 mg/dL — AB (ref 70–99)
Potassium: 4.5 mmol/L (ref 3.5–5.1)
SODIUM: 140 mmol/L (ref 135–145)
TOTAL PROTEIN: 6.9 g/dL (ref 6.5–8.1)
Total Bilirubin: 0.6 mg/dL (ref 0.3–1.2)

## 2018-02-03 LAB — CSF CELL COUNT WITH DIFFERENTIAL
RBC Count, CSF: 14 /mm3 — ABNORMAL HIGH
RBC Count, CSF: 2 /mm3 — ABNORMAL HIGH
Tube #: 1
Tube #: 4
WBC CSF: 2 /mm3 (ref 0–5)
WBC, CSF: 4 /mm3 (ref 0–5)

## 2018-02-03 LAB — HEMOGLOBIN A1C
Hgb A1c MFr Bld: 6.2 % — ABNORMAL HIGH (ref 4.8–5.6)
Mean Plasma Glucose: 131.24 mg/dL

## 2018-02-03 LAB — MRSA PCR SCREENING: MRSA by PCR: NEGATIVE

## 2018-02-03 LAB — CBC
HCT: 44.3 % (ref 39.0–52.0)
Hemoglobin: 14.2 g/dL (ref 13.0–17.0)
MCH: 29.8 pg (ref 26.0–34.0)
MCHC: 32.1 g/dL (ref 30.0–36.0)
MCV: 92.9 fL (ref 78.0–100.0)
Platelets: 273 10*3/uL (ref 150–400)
RBC: 4.77 MIL/uL (ref 4.22–5.81)
RDW: 13.4 % (ref 11.5–15.5)
WBC: 7.7 10*3/uL (ref 4.0–10.5)

## 2018-02-03 LAB — GLUCOSE, CSF: Glucose, CSF: 83 mg/dL — ABNORMAL HIGH (ref 40–70)

## 2018-02-03 LAB — TSH: TSH: 1.369 u[IU]/mL (ref 0.350–4.500)

## 2018-02-03 LAB — PROTEIN, CSF: TOTAL PROTEIN, CSF: 58 mg/dL — AB (ref 15–45)

## 2018-02-03 MED ORDER — RAMIPRIL 10 MG PO CAPS
10.0000 mg | ORAL_CAPSULE | Freq: Every day | ORAL | Status: DC
  Administered 2018-02-04 – 2018-02-05 (×2): 10 mg via ORAL
  Filled 2018-02-03 (×2): qty 1

## 2018-02-03 MED ORDER — EZETIMIBE 10 MG PO TABS
10.0000 mg | ORAL_TABLET | Freq: Every day | ORAL | Status: DC
  Administered 2018-02-04 – 2018-02-09 (×6): 10 mg via ORAL
  Filled 2018-02-03 (×6): qty 1

## 2018-02-03 MED ORDER — ATORVASTATIN CALCIUM 80 MG PO TABS
80.0000 mg | ORAL_TABLET | Freq: Every day | ORAL | Status: DC
  Administered 2018-02-04 – 2018-02-09 (×6): 80 mg via ORAL
  Filled 2018-02-03 (×6): qty 1

## 2018-02-03 MED ORDER — METOPROLOL SUCCINATE ER 25 MG PO TB24
25.0000 mg | ORAL_TABLET | Freq: Every day | ORAL | Status: DC
  Administered 2018-02-03 – 2018-02-08 (×6): 25 mg via ORAL
  Filled 2018-02-03 (×6): qty 1

## 2018-02-03 MED ORDER — TRAZODONE HCL 50 MG PO TABS
50.0000 mg | ORAL_TABLET | Freq: Once | ORAL | Status: AC
  Administered 2018-02-03: 50 mg via ORAL
  Filled 2018-02-03: qty 1

## 2018-02-03 MED ORDER — DEXTROSE 5 % IV SOLN
INTRAVENOUS | Status: DC
  Administered 2018-02-03 – 2018-02-05 (×2): via INTRAVENOUS

## 2018-02-03 MED ORDER — DICLOFENAC SODIUM 1 % TD GEL
2.0000 g | Freq: Four times a day (QID) | TRANSDERMAL | Status: DC | PRN
  Administered 2018-02-03 – 2018-02-09 (×5): 2 g via TOPICAL
  Filled 2018-02-03: qty 100

## 2018-02-03 MED ORDER — ACETAMINOPHEN 325 MG PO TABS
650.0000 mg | ORAL_TABLET | Freq: Four times a day (QID) | ORAL | Status: DC | PRN
  Administered 2018-02-03 – 2018-02-06 (×5): 650 mg via ORAL
  Filled 2018-02-03 (×5): qty 2

## 2018-02-03 MED ORDER — ADULT MULTIVITAMIN W/MINERALS CH
1.0000 | ORAL_TABLET | Freq: Every day | ORAL | Status: DC
  Administered 2018-02-04 – 2018-02-09 (×6): 1 via ORAL
  Filled 2018-02-03 (×6): qty 1

## 2018-02-03 MED ORDER — SODIUM CHLORIDE 0.9 % IV BOLUS
1000.0000 mL | Freq: Once | INTRAVENOUS | Status: AC
  Administered 2018-02-03: 1000 mL via INTRAVENOUS

## 2018-02-03 MED ORDER — HEPARIN SODIUM (PORCINE) 5000 UNIT/ML IJ SOLN
5000.0000 [IU] | Freq: Three times a day (TID) | INTRAMUSCULAR | Status: DC
  Administered 2018-02-03 – 2018-02-09 (×10): 5000 [IU] via SUBCUTANEOUS
  Filled 2018-02-03 (×13): qty 1

## 2018-02-03 MED ORDER — LIDOCAINE HCL 2 % IJ SOLN
10.0000 mL | Freq: Once | INTRAMUSCULAR | Status: AC
  Administered 2018-02-03: 200 mg via INTRADERMAL
  Filled 2018-02-03: qty 20

## 2018-02-03 MED ORDER — DIPHENHYDRAMINE HCL 50 MG/ML IJ SOLN
25.0000 mg | Freq: Once | INTRAMUSCULAR | Status: AC
  Administered 2018-02-03: 25 mg via INTRAVENOUS
  Filled 2018-02-03: qty 1

## 2018-02-03 MED ORDER — FLUTICASONE PROPIONATE 50 MCG/ACT NA SUSP
1.0000 | Freq: Every day | NASAL | Status: DC | PRN
  Filled 2018-02-03: qty 16

## 2018-02-03 MED ORDER — ISOSORBIDE MONONITRATE ER 30 MG PO TB24
30.0000 mg | ORAL_TABLET | Freq: Every day | ORAL | Status: DC
  Administered 2018-02-03 – 2018-02-09 (×7): 30 mg via ORAL
  Filled 2018-02-03 (×7): qty 1

## 2018-02-03 MED ORDER — IMMUNE GLOBULIN (HUMAN) 5 GM/50ML IV SOLN
400.0000 mg/kg | INTRAVENOUS | Status: AC
  Administered 2018-02-03 – 2018-02-07 (×5): 35 g via INTRAVENOUS
  Filled 2018-02-03 (×7): qty 50

## 2018-02-03 MED ORDER — ZOLPIDEM TARTRATE 5 MG PO TABS
5.0000 mg | ORAL_TABLET | Freq: Once | ORAL | Status: AC
  Administered 2018-02-03: 5 mg via ORAL
  Filled 2018-02-03: qty 1

## 2018-02-03 NOTE — H&P (Signed)
History and Physical    Henry Morrison PIR:[id number] DOB: 1946/03/28 DOA: 02/03/2018  Referring MD/NP/PA: Dr. Venora Maples. PCP: Leanna Battles, MD  Patient coming from: Home  Chief Complaint: Weakness and paresthesia in his lower extremities.  HPI: Henry Morrison is a 72 y.o. male with a past medical history significant for coronary artery disease, history of a skin cancer, hyperlipidemia, hypertension and degenerative joint disease; who presented to the emergency department secondary to lower extremity weakness and paresthesia.  Patient reported having his flu shot a week prior to admission and apparently 4 days after flu shot he was experiencing some paresthesia in his lower extremities, the discomfort extended into the fingertip of his hands and he was seen in the emergency department for that.  At that time he received fluid resuscitation no acute abnormalities were found and they attributed his symptoms to mild dehydration and prolonged heat exposure.  Despite some improvement the next 8 symptoms are started to come back and on the day of admission he was essentially having difficulty walking and even had a mild episode of falling in the bathroom.  He did not injure himself from the fall. Patient was evaluated by PCP who got concern for GBS and send the patient back to the emergency department for further evaluation and management. Patient denies chest pain, fever, chills, nausea, vomiting, abdominal pain, diarrhea, hematuria, dysuria, melena, hematochezia, other focal weakness, headaches or difficulty with his vision.  In the ED physical examination demonstrated weakness, decreased sensation and affected reflexes in his lower extremities.  Neurology was consulted and based on his presentation confirmed concerns for GBS.  TRH has been called to place patient in the hospital for further evaluation and management.  IVIG has been ordered by neurology.  Past Medical/Surgical History: Past Medical  History:  Diagnosis Date  . Arthritis   . CAD (coronary artery disease)   . Cancer ([place])    skin cancer   . Hyperlipidemia   . Primary localized osteoarthritis of right knee 06/09/2015    Past Surgical History:  Procedure Laterality Date  . COLONOSCOPY    . CORONARY ANGIOPLASTY WITH STENT PLACEMENT  10/2000   stenting of the CX vessel  . KNEE ARTHROSCOPY W/ MENISCECTOMY Left 07/15/2006  . KNEE ARTHROSCOPY W/ MENISCECTOMY Right 02/09/2012  . removal of skin cancer     right arm  . TOTAL KNEE ARTHROPLASTY Right 06/09/2015   Procedure: RIGHT TOTAL KNEE ARTHROPLASTY;  Surgeon: Elsie Saas, MD;  Location: [place];  Service: Orthopedics;  Laterality: Right;  . US ECHOCARDIOGRAPHY  03/07/2012   Trace AI,mild MR,mild to mod TR    Social History:  reports that he has never smoked. He has never used smokeless tobacco. He reports that he drinks about 10.0 standard drinks of alcohol per week. He reports that he does not use drugs.  Allergies: Allergies  Allergen Reactions  . Other     Seasonal allergies    Family History:  Family History  Problem Relation Age of Onset  . Heart disease Mother   . Cancer Father        kidney    Prior to Admission medications   Medication Sig Start Date End Date Taking? Authorizing Provider  acetaminophen (TYLENOL) 500 MG tablet Take 1,000 mg by mouth daily as needed (for pain).    Yes [provider]  atorvastatin (LIPITOR) 80 MG tablet take 1 tablet by mouth once daily 02/18/17  Yes Troy Sine, MD  Evolocumab (REPATHA SURECLICK) 630 MG/ML  SOAJ Inject 140 mg into the skin every 14 (fourteen) days. 06/06/17  Yes Troy Sine, MD  ezetimibe (ZETIA) 10 MG tablet take 1 tablet by mouth once daily 02/18/17  Yes Troy Sine, MD  fluticasone [facility]) 50 MCG/ACT nasal spray Place 1 spray into both nostrils as needed for allergies.  05/24/13  Yes [provider]  isosorbide mononitrate (IMDUR) 30 MG 24 hr tablet take 1 tablet by mouth  once daily 02/18/17  Yes Troy Sine, MD  metoprolol succinate (TOPROL-XL) 25 MG 24 hr tablet take 1 tablet by mouth once daily 02/18/17  Yes Troy Sine, MD  Multiple Vitamin (MULTIVITAMIN) tablet Take 1 tablet by mouth daily.   Yes [provider]  ramipril (ALTACE) 10 MG capsule take 1 capsule by mouth once daily 02/18/17  Yes Troy Sine, MD    Review of Systems:  Negative except as otherwise mentioned in HPI.   Physical Exam: Vitals:   02/03/18 1042 02/03/18 1504 02/03/18 1616  BP: (!) 142/80 (!) 145/81 (!) 168/89  Pulse: 90 69 74  Resp: 18  17  Temp: 98.5 F (36.9 C)  98.1 F (36.7 C)  TempSrc: Oral  Oral  SpO2: 95% 97% 97%  Weight:   90 kg  Height:   5\' 11"  (1.803 m)    Constitutional: NAD, calm, comfortable; denies chest pain, abdominal pain, nausea, vomiting, and shortness of breath.  Patient appears well-developed and well-nourished. Eyes: PERRL, lids and conjunctivae normal, no icterus, no nystagmus. ENMT: Mucous membranes are moist. Posterior pharynx clear of any exudate or lesions.no thrush.  Hearing aid in place.    Neck: normal, supple, no masses, no thyromegaly, no JVD Respiratory: clear to auscultation bilaterally, no wheezing, no crackles. Normal respiratory effort. No accessory muscle use.  Cardiovascular: Regular rate and rhythm, no murmurs / rubs / gallops. No extremity edema. 2+ pedal pulses. No carotid bruits.  Abdomen: no tenderness, no masses palpated. No hepatosplenomegaly. Bowel sounds positive.  Musculoskeletal: no clubbing / cyanosis. No joint deformity upper and lower extremities. Good ROM, no contractures. Normal muscle tone.  Skin: no rashes, lesions, ulcers. No induration Neurologic: CN 2-12 grossly intact (but patient is wearing hearing aids). Sensation decrease in his soles and feet; feels good above ankles. Strength 4/5 in lower extremities bilaterally; normal strength in his upper extremities.    Psychiatric: Normal judgment  and insight. Alert and oriented x 3. Normal mood.    Labs on Admission: I have personally reviewed the following labs and imaging studies  CBC: Recent Labs  Lab 01/31/18 1738 02/03/18 1041  WBC 11.3* 7.7  HGB 13.8 14.2  HCT 43.7 44.3  MCV 94.6 92.9  PLT 273 818   Basic Metabolic Panel: Recent Labs  Lab 01/31/18 1738 02/03/18 1041  NA 142 140  K 4.9 4.5  CL 107 105  CO2 27 22  GLUCOSE 100* 183*  BUN 13 22  CREATININE 0.92 0.92  CALCIUM 10.0 10.4*   GFR: Estimated Creatinine Clearance: 78.4 mL/min (by C-G formula based on SCr of 0.92 mg/dL).   Liver Function Tests: Recent Labs  Lab 02/03/18 1041  AST 36  ALT 44  ALKPHOS 74  BILITOT 0.6  PROT 6.9  ALBUMIN 4.0   Cardiac Enzymes: Recent Labs  Lab 01/31/18 1740  CKTOTAL 604*   Urine analysis:    Component Value Date/Time   COLORURINE YELLOW 01/31/2018 [place] 01/31/2018 1745   LABSPEC 1.015 01/31/2018 1745   PHURINE 5.0  01/31/2018 1745   GLUCOSEU NEGATIVE 01/31/2018 1745   HGBUR SMALL (A) 01/31/2018 1745   BILIRUBINUR NEGATIVE 01/31/2018 1745   KETONESUR NEGATIVE 01/31/2018 1745   PROTEINUR NEGATIVE 01/31/2018 1745   NITRITE NEGATIVE 01/31/2018 1745   LEUKOCYTESUR NEGATIVE 01/31/2018 1745    Recent Results (from the past 240 hour(s))  CSF culture     Status: None (Preliminary result)   Collection Time: 02/03/18 12:41 PM  Result Value Ref Range Status   Specimen Description CSF  Final   Special Requests NONE  Final   Gram Stain   Final    CYTOSPIN SMEAR WBC PRESENT, PREDOMINANTLY MONONUCLEAR NO ORGANISMS SEEN Performed at [facility], [place] [address] [place], [place] [address]    Culture PENDING  Incomplete   Report Status PENDING  Incomplete     Radiological Exams on Admission: No results found.  EKG: none   Assessment/Plan 1-ascending paralysis (HCC)/GBS -Patient is status post flu vaccination a week prior to this presentation. -Characteristic symptoms and  findings suggesting GBS. -Case has been discussed with neurology and they have recommended initiation of IVIG. -Patient will be admitted to stepdown for close monitoring at least initially while providing treatment with IVIG. -Physical therapy evaluation has been requested -IV fluids and supportive care will be provided. -LP has been done in the ED and results are pending at this moment. -will check TSH  2-CAD (coronary artery disease) -No chest pain and no shortness of breath -Continue statins, imdur, beta-blocker and ACE inhibitor.  3-Familial hyperlipidemia, high LDL -Continue statins and Zetia  4-Essential hypertension -Stable and well-controlled. -Continue current antihypertensive regimen. -Patient has been encouraged to follow heart healthy diet.  5-DJD (degenerative joint disease) of knee -Status post arthroplasty -Continue PRN pain medications.  6-Hyperglycemia -No prior history of diabetes -Repeat CBG in a.m. and check A1c.   DVT prophylaxis: Heparin. Code Status: Full code. Family Communication: Wife and son at bedside. Disposition Plan: To be determined. Hopefully home once medically stable; after treating GBS Consults called: Neurology service. Admission status: Inpatient, LOS more than 2 midnights, stepdown bed.   Time Spent: 65 minutes  Barton Dubois MD [facility] Pager [phone]  If 7PM-7AM, please contact night-coverage [email or web address] Password [facility]  02/03/2018, 4:36 PM

## 2018-02-03 NOTE — Progress Notes (Addendum)
Paged MD about patient wanting something for sleep tonight and also to obtain prn b/p if needed for his elevated pressures. Awaiting response.   2000- New orders noted and carried out

## 2018-02-03 NOTE — Consult Note (Addendum)
Neurology Consultation Referring Physician: Venora Maples  CC:  Progressive weakness  History is obtained from: patient  HPI: Henry Morrison is a 72 y.o. male  With PMH of HTN, HLD who presented to [facility] ED with a 6 day history of bilateral foot numbness.  Patient states that his symptoms began 6 days ago. He came to the ED about 3 days ago, and since then his weakness in his legs has gotten worse over the past 24 hours.  Patient did receive the flu shot 1 week ago. Denies any fever, chills, CP, SOB,dizziness, or diplopia. Patient states that 9/14 the numbness began in bilateral toes. It has now progressed to bilateral bottom of feet. Patient was also able to walk on 9/14 and has progressively gotten weak to the point where today he fell, could not get up and now cannot walk.  Denies hitting his head or any LOC, EMS was called at this time and patient transported to ED. Patient also states that he had numbness in bilateral finger tips that has not progressed to the palm of his hand. Denies any problems, swallowing, chewing or breathing.   ROS: A 14 point ROS was performed and is negative except as noted in the HPI.     Past Medical History:  Diagnosis Date  . Arthritis   . CAD (coronary artery disease)   . Cancer ([place])    skin cancer   . Hyperlipidemia   . Primary localized osteoarthritis of right knee 06/09/2015     Family History  Problem Relation Age of Onset  . Heart disease Mother   . Cancer Father        kidney     Social History:  reports that he has never smoked. He has never used smokeless tobacco. He reports that he drinks about 10.0 standard drinks of alcohol per week. He reports that he does not use drugs.   Exam: Current vital signs: BP (!) 142/80   Pulse 90   Temp 98.5 F (36.9 C) (Oral)   Resp 18   SpO2 95%  Vital signs in last 24 hours: Temp:  [98.5 F (36.9 C)-99 F (37.2 C)] 98.5 F (36.9 C) (09/20 1042) Pulse Rate:  [67-90] 90 (09/20 1042) Resp:   [15-20] 18 (09/20 1042) BP: (139-167)/(80-127) 142/80 (09/20 1042) SpO2:  [95 %-100 %] 95 % (09/20 1042)   Physical Exam  Constitutional: Appears well-developed and well-nourished.  Psych: Affect appropriate to situation Eyes: No scleral injection HENT: No OP obstrucion Head: Normocephalic.  Cardiovascular: Normal rate and regular rhythm.  Respiratory: Effort normal, non-labored breathing GI: Soft.  No distension. There is no tenderness.  Skin: WDI  Neuro: Mental Status: Patient is awake, alert, oriented to person, place, month, year, and situation. Patient is able to give a clear and coherent history. No signs of aphasia or neglect. Cranial Nerves: II: Visual Fields are full. Pupils are equal, round, and reactive to light.   III,IV, VI: EOMI without ptosis or diploplia.  V: Facial sensation is symmetric to light touch VII: Facial movement is symmetric.  VIII: hearing is intact to voice; does wear hearing aid X: Uvula elevates symmetrically XI: Shoulder shrug is symmetric. XII: tongue is midline without atrophy or fasciculations.  Motor: Tone is normal. Bulk is normal. 4/5 strength BLE 5/5 BUE  Sensory: Bilateral hands sensation is decreased in fingers and palms.  Bilateral soles of feet with decreased sensation, patient feels everything normally above the ankle.  Deep Tendon Reflexes: Areflexic biceps, patella  Plantars: Toes are downgoing bilaterally.  Cerebellar: FNF and HKS are intact bilaterally   I have reviewed labs in epic and the results pertinent to this consultation are: BG: 183  Attending Neurologist note to follow:  I have reviewed the images obtained: no new images.  CT Head 01/31/18 : no abnormality  Impression:  With PMH of HTN, HLD who presented to [facility] ED with a 6 day history of bilateral foot numbness and ascending weakness. Patient received flu shot 1 week ago.  History and  exam consistent with Guillain-Barr. LP showed protein of 58 and cell  count of 2.   Recommendations:  -IVIG .4g/KG x 5 days -PT/OT -Neurology will continue to follow   Laurey Morale, MSN, NP-C [facility] [phone]     NEUROHOSPITALIST ADDENDUM Performed a face to face diagnostic evaluation.   I have reviewed the contents of history and physical exam as documented by PA/ARNP/Resident and agree with above documentation.  I have discussed and formulated the above plan as documented. Edits to the note have been made as needed.  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  He has of consistent with albumino cytological dissociation. Key exam findings: Plan: 5 days of IVIG and PT OT    Matrice Herro MD [facility] [phone]   If 7pm to 7am, please call on call as listed on AMION.

## 2018-02-03 NOTE — ED Provider Notes (Signed)
[facility] EMERGENCY DEPARTMENT Provider Note   CSN: [id number] Arrival date & time: 02/03/18  1034     History   Chief Complaint Chief Complaint  Patient presents with  . Weakness    HPI Henry Morrison is a 72 y.o. male.  HPI Patient is a 72 year old male presents the emergency department with progressive paresthesias of his bilateral lower extremities in his bilateral hands now worsening over the past 6 days.  He is developed increasing weakness in his lower extremities bilaterally over the past 24 to 30 hours.  He was sent by his primary care physician for evaluation for possible Ethelene Hal syndrome.  He does report receiving his flu vaccine 1 week ago today.  Otherwise healthy.  Denies significant back pain.  No fevers or chills.  No recent upper respiratory symptoms.  Denies abdominal pain.  Reports that he fell in the shower today secondary to weakness in his legs.  No injury from the fall   Past Medical History:  Diagnosis Date  . Arthritis   . CAD (coronary artery disease)   . Cancer ([place])    skin cancer   . Hyperlipidemia   . Primary localized osteoarthritis of right knee 06/09/2015    Patient Active Problem List   Diagnosis Date Noted  . Primary localized osteoarthritis of right knee 06/09/2015  . DJD (degenerative joint disease) of knee 06/09/2015  . Preoperative clearance 07/06/2014  . CAD (coronary artery disease) 06/14/2013  . Familial hyperlipidemia, high LDL 06/14/2013  . Essential hypertension 06/14/2013  . Melanoma ([place]) 06/14/2013    Past Surgical History:  Procedure Laterality Date  . COLONOSCOPY    . CORONARY ANGIOPLASTY WITH STENT PLACEMENT  10/2000   stenting of the CX vessel  . KNEE ARTHROSCOPY W/ MENISCECTOMY Left 07/15/2006  . KNEE ARTHROSCOPY W/ MENISCECTOMY Right 02/09/2012  . removal of skin cancer     right arm  . TOTAL KNEE ARTHROPLASTY Right 06/09/2015   Procedure: RIGHT TOTAL KNEE ARTHROPLASTY;  Surgeon: Elsie Saas, MD;  Location: [facility];  Service: Orthopedics;  Laterality: Right;  . US ECHOCARDIOGRAPHY  03/07/2012   Trace AI,mild MR,mild to mod TR        Home Medications    Prior to Admission medications   Medication Sig Start Date End Date Taking? Authorizing Provider  acetaminophen (TYLENOL) 500 MG tablet Take 1,000 mg by mouth daily as needed (for pain).    Yes [provider]  atorvastatin (LIPITOR) 80 MG tablet take 1 tablet by mouth once daily 02/18/17  Yes Troy Sine, MD  Evolocumab (REPATHA SURECLICK) 063 MG/ML SOAJ Inject 140 mg into the skin every 14 (fourteen) days. 06/06/17  Yes Troy Sine, MD  ezetimibe (ZETIA) 10 MG tablet take 1 tablet by mouth once daily 02/18/17  Yes Troy Sine, MD  fluticasone [facility]) 50 MCG/ACT nasal spray Place 1 spray into both nostrils as needed for allergies.  05/24/13  Yes [provider]  isosorbide mononitrate (IMDUR) 30 MG 24 hr tablet take 1 tablet by mouth once daily 02/18/17  Yes Troy Sine, MD  metoprolol succinate (TOPROL-XL) 25 MG 24 hr tablet take 1 tablet by mouth once daily 02/18/17  Yes Troy Sine, MD  Multiple Vitamin (MULTIVITAMIN) tablet Take 1 tablet by mouth daily.   Yes [provider]  ramipril (ALTACE) 10 MG capsule take 1 capsule by mouth once daily 02/18/17  Yes Troy Sine, MD    Family History Family  History  Problem Relation Age of Onset  . Heart disease Mother   . Cancer Father        kidney    Social History Social History   Tobacco Use  . Smoking status: Never Smoker  . Smokeless tobacco: Never Used  Substance Use Topics  . Alcohol use: Yes    Alcohol/week: 10.0 standard drinks    Types: 10 Standard drinks or equivalent per week    Comment: wine 2 galsses each day  . Drug use: No     Allergies   Other   Review of Systems Review of Systems  All other systems reviewed and are negative.    Physical Exam Updated Vital Signs BP (!) 142/80   Pulse 90    Temp 98.5 F (36.9 C) (Oral)   Resp 18   SpO2 95%   Physical Exam  Constitutional: He is oriented to person, place, and time. He appears well-developed and well-nourished.  HENT:  Head: Normocephalic and atraumatic.  Eyes: EOM are normal.  Neck: Normal range of motion.  Cardiovascular: Normal rate, regular rhythm, normal heart sounds and intact distal pulses.  Pulmonary/Chest: Effort normal and breath sounds normal. No respiratory distress.  Abdominal: Soft. He exhibits no distension. There is no tenderness.  Musculoskeletal: Normal range of motion.  Mild generalized grip strength.  Mild weakness of biceps bilaterally.  Strong shoulders bilaterally.  Mild but symmetric weakness of both plantar and dorsiflexion as well as flexion extension at the level of the knee and hip flexors.  Absent bilateral patellar reflexes  Neurological: He is alert and oriented to person, place, and time.  Face symmetric.  Speech normal.  Patient with mild weakness of his grip strength bilaterally.  Normal bicep function.  Patient with mild generalized weakness of his bilateral lower extremity major muscle groups.  Areflexic patellar reflexes.  Skin: Skin is warm and dry.  Psychiatric: He has a normal mood and affect. Judgment normal.  Nursing note and vitals reviewed.    ED Treatments / Results  Labs (all labs ordered are listed, but only abnormal results are displayed) Labs Reviewed  COMPREHENSIVE METABOLIC PANEL - Abnormal; Notable for the following components:      Result Value   Glucose, Bld 183 (*)    Calcium 10.4 (*)    All other components within normal limits  CSF CELL COUNT WITH DIFFERENTIAL - Abnormal; Notable for the following components:   Appearance, CSF CLEAR (*)    RBC Count, CSF 14 (*)    All other components within normal limits  CSF CELL COUNT WITH DIFFERENTIAL - Abnormal; Notable for the following components:   Appearance, CSF CLEAR (*)    RBC Count, CSF 2 (*)    All other  components within normal limits  GLUCOSE, CSF - Abnormal; Notable for the following components:   Glucose, CSF 83 (*)    All other components within normal limits  PROTEIN, CSF - Abnormal; Notable for the following components:   Total  Protein, CSF 58 (*)    All other components within normal limits  HEMOGLOBIN A1C - Abnormal; Notable for the following components:   Hgb A1c MFr Bld 6.2 (*)    All other components within normal limits  CSF CULTURE  MRSA PCR SCREENING  CBC  IGA  TSH    EKG None  Radiology No results found.  Procedures .Lumbar Puncture Performed by: Jola Schmidt, MD Authorized by: Jola Schmidt, MD   Consent:    Consent obtained:  Verbal and written   Consent given by:  Patient   Risks discussed:  Bleeding, infection, pain and headache Pre-procedure details:    Procedure purpose:  Diagnostic   Preparation: Patient was prepped and draped in usual sterile fashion   Anesthesia (see MAR for exact dosages):    Anesthesia method:  Local infiltration   Local anesthetic:  Lidocaine 1% w/o epi Procedure details:    Lumbar space:  L3-L4 interspace   Patient position:  Sitting   Needle gauge:  18   Needle length (in):  2.5   Number of attempts:  2   Fluid appearance:  Clear   Tubes of fluid:  4   Total volume (ml):  4 Post-procedure:    Puncture site:  Adhesive bandage applied and direct pressure applied   Patient tolerance of procedure:  Tolerated well, no immediate complications   (including critical care time)  Medications Ordered in ED Medications  lidocaine (XYLOCAINE) 2 % (with pres) injection 200 mg (has no administration in time range)     Initial Impression / Assessment and Plan / ED Course  I have reviewed the triage vital signs and the nursing notes.  Pertinent labs & imaging results that were available during my care of the patient were reviewed by me and considered in my medical decision making (see chart for details).     Patient  will need lumbar puncture and neurology consultation for possible Ethelene Hal.  Lumbar puncture will be performed at this time.  Labs pending.  No significant back pain.  Lower suspicion for thoracic or lumbar cord issue.  Will withhold MRI at this time.  Neurology consultation.  Case discussed with neurology.  Patient will be admitted to the hospital for IVIG for suspected Guillain Barr Syndrome.  Hospitalist admission.  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

## 2018-02-03 NOTE — Progress Notes (Signed)
Henry Morrison is a 72 y.o. male patient admitted from ED awake, alert - oriented  X 4 - no acute distress noted.  VSS - Blood pressure (!) 168/89, pulse 74, temperature 98.1 F (36.7 C), temperature source Oral, resp. rate 17, height 5\' 11"  (1.803 m), weight 90 kg, SpO2 97 %.    IV in place, occlusive dsg intact without redness.  Orientation to room, and floor completed with information packet given to patient/family.  Patient declined safety video at this time.  Admission INP armband ID verified with patient/family, and in place.   SR up x 2, fall assessment complete, with patient and family able to verbalize understanding of risk associated with falls, and verbalized understanding to call nsg before up out of bed.  Call light within reach, patient able to voice, and demonstrate understanding.  Skin, clean-dry- intact without evidence of bruising, or skin tears.   No evidence of skin break down noted on exam.     Will cont to eval and treat per MD orders.  Holley Raring, RN 02/03/2018 4:17 PM

## 2018-02-03 NOTE — Progress Notes (Signed)
MD paged about patient needing a diet.

## 2018-02-03 NOTE — ED Triage Notes (Addendum)
Pt from home via ems; pt c/o increasing bilateral leg weakness x 6 days; seen at [facility] ED Tuesday for weakness and numbness in feet and Thursday for glove like sensation in hands, numbness moving toward knees; scheduled today for a spinal tap, unable to walk today; neurology suspects guillain barre syndrome, recent flu vaccine  142/88 P 88 CBG 224 RR 18

## 2018-02-04 LAB — IGA: IGA: 233 mg/dL (ref 61–437)

## 2018-02-04 MED ORDER — DIPHENHYDRAMINE HCL 25 MG PO CAPS
25.0000 mg | ORAL_CAPSULE | Freq: Once | ORAL | Status: AC
  Administered 2018-02-04: 25 mg via ORAL
  Filled 2018-02-04: qty 1

## 2018-02-04 MED ORDER — HYDRALAZINE HCL 20 MG/ML IJ SOLN
10.0000 mg | Freq: Three times a day (TID) | INTRAMUSCULAR | Status: DC | PRN
  Administered 2018-02-07 – 2018-02-08 (×2): 10 mg via INTRAVENOUS
  Filled 2018-02-04 (×2): qty 1

## 2018-02-04 NOTE — Progress Notes (Addendum)
NEURO HOSPITALIST PROGRESS NOTE   Subjective: Patient in bed awake, alert, eyes open, NAD on RA. Bilateral foot numbness has improved, but is still present. 1 dose IVIG given.   Exam: Vitals:   02/04/18 0537 02/04/18 0752  BP:  (!) 162/85  Pulse:  72  Resp:  13  Temp: 98.3 F (36.8 C) 98.4 F (36.9 C)  SpO2:  96%    Physical Exam   HEENT-  Normocephalic, no lesions, without obvious abnormality.  Normal external eye and conjunctiva.   Cardiovascular- S1-S2 audible, pulses palpable throughout   Lungs-no rhonchi or wheezing noted, no excessive working breathing.  Saturations within normal limits on RA Abdomen- All 4 quadrants palpated and nontender Extremities- Warm, dry and intact Musculoskeletal-no joint tenderness, deformity or swelling Skin-warm and dry, no hyperpigmentation, vitiligo, or suspicious lesions   Neuro:  Neuro: Mental Status: Patient is awake, alert, oriented to person, place, month, year, and situation. Patient is able to give a clear and coherent history. No signs of aphasia or neglect. Cranial Nerves: II: Visual Fields are full. Pupils are equal, round, and reactive to light.   III,IV, VI: EOMI without ptosis or diploplia.  V: Facial sensation is symmetric to light touch VII: Facial movement is symmetric.  VIII: hearing is intact to voice; does wear hearing aid X: Uvula elevates symmetrically XI: Shoulder shrug is symmetric. XII: tongue is midline without atrophy or fasciculations.  Motor: Tone is normal. Bulk is normal. 4/5 strength BLE 5/5 BUE  Sensory: Bilateral hands sensation is decreased in fingers and palms.  Bilateral soles of feet with decreased sensation (improving), patient feels everything normally above the ankle.  Deep Tendon Reflexes: Areflexic biceps, patella  Plantars: Toes are downgoing bilaterally.  Cerebellar: FNF and HKS are intact bilaterally    Medications:  Scheduled: . atorvastatin  80 mg  Oral Daily  . ezetimibe  10 mg Oral Daily  . heparin  5,000 Units Subcutaneous Q8H  . Immune Globulin 10%  400 mg/kg Intravenous Q24 Hr x 5  . isosorbide mononitrate  30 mg Oral Daily  . metoprolol succinate  25 mg Oral QHS  . multivitamin with minerals  1 tablet Oral Daily  . ramipril  10 mg Oral Daily   Continuous: . dextrose Stopped (02/03/18 2249)   VEL:[id number], diclofenac sodium, fluticasone  Pertinent Labs/Diagnostics:  no new labs/diagnostics  Assessment:  With PMH of HTN, HLD who presented to [facility] ED with a 6 day history of bilateral foot numbness and ascending weakness. Patient received flu shot 1 week ago.  History and  exam consistent with Guillain-Barr. LP showed protein of 58 and cell count of 2.  Impression:  Guillain-Barre.  Recommendations:  -continue IVIG 0.4g/KG x 5 days ( 2nd dose today) last dose 02/08/18 -PT/OT -Neurology will continue to follow  Laurey Morale, MSN, NP-C [facility] [phone]      02/04/2018, 8:35 AM    NEUROHOSPITALIST ADDENDUM Performed a face to face diagnostic evaluation.   Previous slightly better from yesterday.  Continue IVIG for 4 more days.  Work with PT OT.  I have reviewed the contents of history and physical exam as documented by PA/ARNP/Resident and agree with above documentation.  I have discussed and formulated the above plan as documented. Edits to the note have been made as needed.      Karena Addison Neveah Bang MD [facility] [phone]  If 7pm to 7am, please call on call as listed on AMION.

## 2018-02-04 NOTE — Evaluation (Signed)
Physical Therapy Evaluation Patient Details Name: Henry Morrison MRN: [id number] DOB: 23-Jan-1946 Today's Date: 02/04/2018   History of Present Illness  72 y.o. male with a past medical history significant for coronary artery disease, history of a skin cancer, hyperlipidemia, hypertension and degenerative joint disease; who presented to the emergency department secondary to lower extremity weakness and paresthesia.  Patient reported having his flu shot a week prior to admission. History and exam consistent with Guillain-Barr.     Clinical Impression  Pt admitted with above diagnosis. Pt currently with functional limitations due to the deficits listed below (see PT Problem List). PTA pt independent and active. On eval, he required min assist bed mobility, mod assist sit to stand, and min/mod assist +2 safety/equipment ambulation 25 feet with RW. Pt highly motivated to regain his mobility and very appreciative for PT intervention.  At time of eval, pt had received his first dose of IVIG.  He is scheduled to received 5 more doses (one per night) with the last dose being 02-08-18. Pt will benefit from skilled PT to increase their independence and safety with mobility to allow discharge to the venue listed below.       Follow Up Recommendations Home health PT;Supervision for mobility/OOB    Equipment Recommendations  None recommended by PT    Recommendations for Other Services       Precautions / Restrictions Precautions Precautions: Fall      Mobility  Bed Mobility Overal bed mobility: Needs Assistance Bed Mobility: Supine to Sit     Supine to sit: Min assist;HOB elevated     General bed mobility comments: +rail  Transfers Overall transfer level: Needs assistance Equipment used: Rolling walker (2 wheeled) Transfers: Sit to/from Stand Sit to Stand: +2 safety/equipment;Mod assist         General transfer comment: cues for hand placement, assist to power  up  Ambulation/Gait Ambulation/Gait assistance: +2 safety/equipment;Min assist;Mod assist Gait Distance (Feet): 25 Feet Assistive device: Rolling walker (2 wheeled) Gait Pattern/deviations: Step-through pattern;Decreased stride length Gait velocity: decreased Gait velocity interpretation: <1.31 ft/sec, indicative of household ambulator General Gait Details: heavy reliance on RW, knee buckling x 1 requiring mod assist to recover, otherwise min assist +2 safety/equip  Stairs            Wheelchair Mobility    Modified Rankin (Stroke Patients Only)       Balance Overall balance assessment: Needs assistance Sitting-balance support: No upper extremity supported;Feet supported Sitting balance-Leahy Scale: Good     Standing balance support: Bilateral upper extremity supported;During functional activity Standing balance-Leahy Scale: Poor Standing balance comment: heavy reliance on RW                             Pertinent Vitals/Pain Pain Assessment: No/denies pain    Home Living Family/patient expects to be discharged to:: Private residence Living Arrangements: Spouse/significant other Available Help at Discharge: Family;Available 24 hours/day Type of Home: House Home Access: Stairs to enter Entrance Stairs-Rails: Psychiatric nurse of Steps: 3 Home Layout: Two level;Bed/bath upstairs Home Equipment: Walker - 2 wheels;Cane - single point;Crutches;Bedside commode;Hand held shower head;Shower seat      Prior Function Level of Independence: Independent         Comments: active     Hand Dominance   Dominant Hand: Right    Extremity/Trunk Assessment   Upper Extremity Assessment Upper Extremity Assessment: Overall WFL for tasks assessed    Lower Extremity  Assessment Lower Extremity Assessment: RLE deficits/detail;LLE deficits/detail RLE Deficits / Details: 4/5 strength, paresthesia distally RLE Sensation: decreased  proprioception RLE Coordination: decreased fine motor LLE Deficits / Details: 4/5 strength, paresthesia distally LLE Sensation: decreased proprioception LLE Coordination: decreased fine motor    Cervical / Trunk Assessment Cervical / Trunk Assessment: Normal  Communication   Communication: No difficulties  Cognition Arousal/Alertness: Awake/alert Behavior During Therapy: WFL for tasks assessed/performed Overall Cognitive Status: Within Functional Limits for tasks assessed                                        General Comments General comments (skin integrity, edema, etc.): wife present and supportive during session    Exercises     Assessment/Plan    PT Assessment Patient needs continued PT services  PT Problem List Decreased strength;Decreased mobility;Decreased coordination;Decreased activity tolerance;Decreased balance;Decreased knowledge of use of DME       PT Treatment Interventions DME instruction;Therapeutic activities;Gait training;Therapeutic exercise;Patient/family education;Stair training;Balance training;Functional mobility training    PT Goals (Current goals can be found in the Care Plan section)  Acute Rehab PT Goals Patient Stated Goal: regain strength PT Goal Formulation: With patient/family Time For Goal Achievement: 02/18/18 Potential to Achieve Goals: Good    Frequency Min 3X/week   Barriers to discharge        Co-evaluation               AM-PAC PT "6 Clicks" Daily Activity  Outcome Measure Difficulty turning over in bed (including adjusting bedclothes, sheets and blankets)?: A Little Difficulty moving from lying on back to sitting on the side of the bed? : A Lot Difficulty sitting down on and standing up from a chair with arms (e.g., wheelchair, bedside commode, etc,.)?: Unable Help needed moving to and from a bed to chair (including a wheelchair)?: A Little Help needed walking in hospital room?: A Little Help needed  climbing 3-5 steps with a railing? : A Lot 6 Click Score: 14    End of Session Equipment Utilized During Treatment: Gait belt Activity Tolerance: Patient tolerated treatment well Patient left: in chair;with call bell/phone within reach;with family/visitor present Nurse Communication: Mobility status PT Visit Diagnosis: Other abnormalities of gait and mobility (R26.89);Difficulty in walking, not elsewhere classified (R26.2)    Time: 3428-7681 PT Time Calculation (min) (ACUTE ONLY): 27 min   Charges:   PT Evaluation $PT Eval Moderate Complexity: 1 Mod PT Treatments $Gait Training: 8-22 mins        Lorrin Goodell, PT  Office # [phone] Pager [phone]   Lorriane Shire 02/04/2018, 4:01 PM

## 2018-02-04 NOTE — Progress Notes (Signed)
PROGRESS NOTE    Henry Morrison  AJG:[id number] DOB: October 26, 1945 DOA: 02/03/2018 PCP: Leanna Battles, MD     Brief Narrative:  72 y.o. male with a past medical history significant for coronary artery disease, history of a skin cancer, hyperlipidemia, hypertension and degenerative joint disease; who presented to the emergency department secondary to lower extremity weakness and paresthesia.  Patient reported having his flu shot a week prior to admission and apparently 4 days after flu shot he was experiencing some paresthesia in his lower extremities, the discomfort extended into the fingertip of his hands and he was seen in the emergency department for that.  At that time he received fluid resuscitation no acute abnormalities were found and they attributed his symptoms to mild dehydration and prolonged heat exposure.  Despite some improvement the next 8 symptoms are started to come back and on the day of admission he was essentially having difficulty walking and even had a mild episode of falling in the bathroom.  He did not injure himself from the fall. Patient was evaluated by PCP who got concern for GBS and send the patient back to the emergency department for further evaluation and management.   Assessment & Plan: 1-ascending paralysis ([place]) -In the setting of GBS -Continue IVIG as per neurology recommendation -Continue supportive care and have PT/OT evaluation and rec's.  2-CAD (coronary artery disease) -No chest pain or shortness of breath -Continue statins, imdur, beta-blocker and ACE inhibitor.  3-Familial hyperlipidemia, high LDL -Continue statins and Zetia  4-essential hypertension -Fair control -Continue current antihypertensive regimen -PRN hydralazine has also been started (potential autonomic component recent blood pressure 1-IVIG infusion). -Heart healthy diet has been ordered.  5-DJD (degenerative joint disease) of knee -Patient with history of arthroplasty -Continue  PRN pain medication  -Evaluation by PT/OT has been requested.  6-Hyperglycemia: In the setting of prediabetes -Patient A1c 6.2 -Diet modifications discussed with patient -At this moment no need for hypoglycemic regimen. -Continue to monitor CBGs.   DVT prophylaxis: Heparin. Code Status: Full code Family Communication: Wife at bedside. Disposition Plan: Remains inpatient, continue IVIG infusion as per neurology recommendations; physical therapy and Occupational Therapy assessment and recommendations.   Consultants:   Neurology  Procedures:   LP 02/03/2018.  Antimicrobials:  Anti-infectives (From admission, onward)   None       Subjective: Afebrile, no chest pain, no nausea, no vomiting.  Patient denies shortness of breath and reports improvement in his overall aches and tingling sensation in the lower extremities.  Still feeling weak.  Objective: Vitals:   02/04/18 0537 02/04/18 0752 02/04/18 0953 02/04/18 1220  BP:  (!) 162/85 (!) 185/105   Pulse:  72    Resp:  13    Temp: 98.3 F (36.8 C) 98.4 F (36.9 C)  99 F (37.2 C)  TempSrc: Oral Oral  Oral  SpO2:  96%    Weight:      Height:        Intake/Output Summary (Last 24 hours) at 02/04/2018 1311 Last data filed at 02/04/2018 1123 Gross per 24 hour  Intake 1144.95 ml  Output 1725 ml  Net -580.05 ml   Filed Weights   02/03/18 1616  Weight: 90 kg    Examination: General exam: Alert, awake, oriented x 3.  Patient denies chest pain, no shortness of breath, no nausea, no vomiting.  He expressed having aches and tingling on his lower extremity overnight but today no aches and tingling improved. Respiratory system: Clear to auscultation. Respiratory effort  normal. Cardiovascular system:RRR. No murmurs, rubs, gallops. Gastrointestinal system: Abdomen is nondistended, soft and nontender. No organomegaly or masses felt. Normal bowel sounds heard. Central nervous system: Alert and oriented. No focal neurological  deficits. Extremities: No cyanosis, no clubbing, no edema. Skin: No rashes, lesions or ulcers Psychiatry: Judgement and insight appear normal. Mood & affect appropriate.     Data Reviewed: I have personally reviewed following labs and imaging studies  CBC: Recent Labs  Lab 01/31/18 1738 02/03/18 1041  WBC 11.3* 7.7  HGB 13.8 14.2  HCT 43.7 44.3  MCV 94.6 92.9  PLT 273 767   Basic Metabolic Panel: Recent Labs  Lab 01/31/18 1738 02/03/18 1041  NA 142 140  K 4.9 4.5  CL 107 105  CO2 27 22  GLUCOSE 100* 183*  BUN 13 22  CREATININE 0.92 0.92  CALCIUM 10.0 10.4*   GFR: Estimated Creatinine Clearance: 78.4 mL/min (by C-G formula based on SCr of 0.92 mg/dL).   Liver Function Tests: Recent Labs  Lab 02/03/18 1041  AST 36  ALT 44  ALKPHOS 74  BILITOT 0.6  PROT 6.9  ALBUMIN 4.0   Cardiac Enzymes: Recent Labs  Lab 01/31/18 1740  CKTOTAL 604*   HbA1C: Recent Labs    02/03/18 1041  HGBA1C 6.2*   Thyroid Function Tests: Recent Labs    02/03/18 1041  TSH 1.369   Urine analysis:    Component Value Date/Time   COLORURINE YELLOW 01/31/2018 1745   APPEARANCEUR CLEAR 01/31/2018 1745   LABSPEC 1.015 01/31/2018 1745   PHURINE 5.0 01/31/2018 1745   GLUCOSEU NEGATIVE 01/31/2018 1745   HGBUR SMALL (A) 01/31/2018 1745   BILIRUBINUR NEGATIVE 01/31/2018 1745   KETONESUR NEGATIVE 01/31/2018 1745   PROTEINUR NEGATIVE 01/31/2018 1745   NITRITE NEGATIVE 01/31/2018 1745   LEUKOCYTESUR NEGATIVE 01/31/2018 1745    Recent Results (from the past 240 hour(s))  CSF culture     Status: None (Preliminary result)   Collection Time: 02/03/18 12:41 PM  Result Value Ref Range Status   Specimen Description CSF  Final   Special Requests NONE  Final   Gram Stain   Final    CYTOSPIN SMEAR WBC PRESENT, PREDOMINANTLY MONONUCLEAR NO ORGANISMS SEEN    Culture   Final    NO GROWTH < 24 HOURS Performed at [facility], [place] [address] [place], [place] [address]     Report Status PENDING  Incomplete  MRSA PCR Screening     Status: None   Collection Time: 02/03/18  4:24 PM  Result Value Ref Range Status   MRSA by PCR NEGATIVE NEGATIVE Final    Comment:        The GeneXpert MRSA Assay (FDA approved for NASAL specimens only), is one component of a comprehensive MRSA colonization surveillance program. It is not intended to diagnose MRSA infection nor to guide or monitor treatment for MRSA infections. Performed at [facility], [place] [address], [place], [place] [address]      Scheduled Meds: . atorvastatin  80 mg Oral Daily  . ezetimibe  10 mg Oral Daily  . heparin  5,000 Units Subcutaneous Q8H  . Immune Globulin 10%  400 mg/kg Intravenous Q24 Hr x 5  . isosorbide mononitrate  30 mg Oral Daily  . metoprolol succinate  25 mg Oral QHS  . multivitamin with minerals  1 tablet Oral Daily  . ramipril  10 mg Oral Daily   Continuous Infusions: . dextrose Stopped (02/03/18 2249)  LOS: 1 day    Time spent: 30 minutes.   Barton Dubois, MD [facility] Pager [phone]  If 7PM-7AM, please contact night-coverage [email or web address] Password [email or web address] 02/04/2018, 1:11 PM

## 2018-02-05 MED ORDER — TRAMADOL HCL 50 MG PO TABS
50.0000 mg | ORAL_TABLET | Freq: Three times a day (TID) | ORAL | Status: DC | PRN
  Administered 2018-02-06: 50 mg via ORAL
  Filled 2018-02-05: qty 1

## 2018-02-05 MED ORDER — SODIUM CHLORIDE 0.9 % IV BOLUS
500.0000 mL | Freq: Once | INTRAVENOUS | Status: AC
  Administered 2018-02-05: 500 mL via INTRAVENOUS

## 2018-02-05 NOTE — Progress Notes (Addendum)
NEURO HOSPITALIST PROGRESS NOTE   Subjective: Patient awake, alert in bed, NAD. No improvements since yesterday, but he has remained stable.  Exam: Vitals:   02/04/18 2225 02/05/18 0505  BP: (!) 164/94 (!) 144/81  Pulse:    Resp: 20   Temp:  98.1 F (36.7 C)  SpO2:      Physical Exam   HEENT-  Normocephalic, no lesions, without obvious abnormality.  Normal external eye and conjunctiva.   Cardiovascular- S1-S2 audible, pulses palpable throughout   Lungs-no rhonchi or wheezing noted, no excessive working breathing.  Saturations within normal limits on RA Abdomen- All 4 quadrants palpated and nontender Extremities- Warm, dry and intact Musculoskeletal-no joint tenderness, deformity or swelling Skin-warm and dry, no hyperpigmentation, vitiligo, or suspicious lesions   Neuro:  Neuro: Mental Status: Patient is awake, alert, oriented to person, place, month, year, and situation. Patient is able to give a clear and coherent history. No signs of aphasia or neglect. Cranial Nerves: II: Visual Fields are full. Pupils are equal, round, and reactive to light.  III,IV, VI: EOMI without ptosis or diploplia.  V: Facial sensation is symmetric to light touch VII: Facial movement is symmetric.  VIII: hearing is intact to voice; does wear hearing aid X: Uvula elevates symmetrically XI: Shoulder shrug is symmetric. XII: tongue is midline without atrophy or fasciculations.  Motor: Tone is normal. Bulk is normal. 4/5 strength BLE 5/5 BUE  Sensory: Bilateral hands sensation is decreased in fingers and palms.  Bilateral soles of feet with decreased sensation (improving), patient feels everything normally above the ankle.  Deep Tendon Reflexes: Areflexic biceps, patella  Plantars: Toes are downgoing bilaterally.  Cerebellar: FNF and HKS are intact bilaterally    Medications:  Scheduled: . atorvastatin  80 mg Oral Daily  . ezetimibe  10 mg Oral Daily  .  heparin  5,000 Units Subcutaneous Q8H  . Immune Globulin 10%  400 mg/kg Intravenous Q24 Hr x 5  . isosorbide mononitrate  30 mg Oral Daily  . metoprolol succinate  25 mg Oral QHS  . multivitamin with minerals  1 tablet Oral Daily  . ramipril  10 mg Oral Daily   Continuous: . dextrose Stopped (02/04/18 2224)   CLE:[id number], diclofenac sodium, fluticasone, hydrALAZINE  Pertinent Labs/Diagnostics:  no new labs/diagnostics  Assessment:  With PMH of HTN, HLD who presented to [facility] ED with a 6 day history of bilateral foot numbness and ascending weakness. Patient received flu shot 1 week ago.  History and  exam consistent with Guillain-Barr. LP showed protein of 58 and cell count of 2.  Impression:  Guillain-Barre.  Recommendations:  -continue IVIG 0.4g/KG x 5 days ( 3rd dose today) last dose 02/08/18 -PT/OT -Neurology will continue to follow  Laurey Morale, MSN, NP-C [facility] [phone]  02/05/2018, 7:45 AM    NEUROHOSPITALIST ADDENDUM Performed a face to face diagnostic evaluation.   I have reviewed the contents of history and physical exam as documented by PA/ARNP/Resident and agree with above documentation.  I have discussed and formulated the above plan as documented. Edits to the note have been made as needed.  Patient appears clinically better my examination.  Lower extremity strength is increased to 4+ out of 5.    Karena Addison Feliz Herard MD [facility] [phone]   If 7pm to 7am, please call on call as listed on AMION.

## 2018-02-05 NOTE — Evaluation (Signed)
Occupational Therapy Evaluation Patient Details Name: Henry Morrison MRN: [id number] DOB: 08/13/1945 Today's Date: 02/05/2018    History of Present Illness 72 y.o. male with a past medical history significant for coronary artery disease, history of a skin cancer, hyperlipidemia, hypertension and degenerative joint disease; who presented to the emergency department secondary to lower extremity weakness and paresthesia.  Patient reported having his flu shot a week prior to admission. History and exam consistent with Guillain-Barr.    Clinical Impression   Patient was seen to assess needs for OT. Patient is having decreased sensation in [place] and is having difficulty with performing ADL and moblity tasks.Patient was educated to work on performing [facility] tasks to increase ability. Patient is motivated to improve ability to take care of himself. Patient does have 24  Hour care at home. Patient will stay on first floor at home and he has a half bath. Discussed that he should wear pullover and pull up clothing.     Follow Up Recommendations  Home health OT    Equipment Recommendations  None recommended by OT    Recommendations for Other Services       Precautions / Restrictions Precautions Precautions: Fall Restrictions Weight Bearing Restrictions: No      Mobility Bed Mobility                  Transfers     Transfers: Sit to/from Stand;Stand Pivot Transfers Sit to Stand: Min assist Stand pivot transfers: Min assist       General transfer comment: cues for hand placement, assist to power up    Balance                                           ADL either performed or assessed with clinical judgement   ADL Overall ADL's : Needs assistance/impaired Eating/Feeding: Set up   Grooming: Wash/dry hands;Wash/dry face;Moderate assistance Grooming Details (indicate cue type and reason): Patient needs assist with opening containers and shaving Upper Body  Bathing: Minimal assistance;Sitting   Lower Body Bathing: Minimal assistance;Sit to/from stand   Upper Body Dressing : Minimal assistance;Sitting   Lower Body Dressing: Minimal assistance   Toilet Transfer: Minimal assistance;Stand-pivot   Toileting- Clothing Manipulation and Hygiene: Moderate assistance       Functional mobility during ADLs: Minimal assistance General ADL Comments: Patient is having difficulty with performing ADLs secondary to decreased senation in hands and feet.     Vision Baseline Vision/History: Wears glasses Wears Glasses: Distance only Patient Visual Report: No change from baseline       Perception     Praxis      Pertinent Vitals/Pain Pain Assessment: 0-10 Pain Score: 7  Pain Location: (back) Pain Descriptors / Indicators: Aching Pain Intervention(s): Limited activity within patient's tolerance;Monitored during session;Patient requesting pain meds-RN notified     Hand Dominance Right   Extremity/Trunk Assessment Upper Extremity Assessment Upper Extremity Assessment: RUE deficits/detail;LUE deficits/detail RUE Deficits / Details: Patient has decreased sensation and is having difficulty performing [place] activies LUE Deficits / Details: Patient has decreased sensation and is having difficulty performing [facility] activies           Communication Communication Communication: No difficulties   Cognition Arousal/Alertness: Awake/alert Behavior During Therapy: WFL for tasks assessed/performed Overall Cognitive Status: Within Functional Limits for tasks assessed  General Comments       Exercises     Shoulder Instructions      Home Living Family/patient expects to be discharged to:: Private residence Living Arrangements: Spouse/significant other Available Help at Discharge: Family;Available 24 hours/day Type of Home: House Home Access: Stairs to enter [employer] of Steps:  3 Entrance Stairs-Rails: Right;Left Home Layout: Two level;Bed/bath upstairs Alternate Level Stairs-Number of Steps: 13 Alternate Level Stairs-Rails: Right Bathroom Shower/Tub: Walk-in shower;Door   [employer] Toilet: Standard Bathroom Accessibility: Yes How Accessible: Accessible via wheelchair Home Equipment: Walker - 2 wheels;Cane - single point;Crutches;Hand held shower head;Shower seat          Prior Functioning/Environment Level of Independence: Independent        Comments: active        OT Problem List: Decreased activity tolerance;Decreased knowledge of use of DME or AE;Impaired sensation;Pain      OT Treatment/Interventions: Self-care/ADL training;DME and/or AE instruction;Therapeutic activities;Patient/family education    OT Goals(Current goals can be found in the care plan section) Acute Rehab OT Goals Patient Stated Goal: to get his sensation back OT Goal Formulation: With patient Time For Goal Achievement: 02/19/18 Potential to Achieve Goals: Good  OT Frequency: Min 2X/week   Barriers to D/C:            Co-evaluation              AM-PAC PT "6 Clicks" Daily Activity     Outcome Measure Help from another person eating meals?: A Little Help from another person taking care of personal grooming?: A Little Help from another person toileting, which includes using toliet, bedpan, or urinal?: A Little Help from another person bathing (including washing, rinsing, drying)?: A Little Help from another person to put on and taking off regular upper body clothing?: A Little Help from another person to put on and taking off regular lower body clothing?: A Little 6 Click Score: 18   End of Session Equipment Utilized During Treatment: Gait belt;Rolling walker Nurse Communication: Patient requests pain meds  Activity Tolerance: Patient tolerated treatment well Patient left: in chair;with call bell/phone within reach  OT Visit Diagnosis: Unsteadiness on feet  (R26.81)                Time: 0827-0909 OT Time Calculation (min): 42 min Charges:  OT General Charges $OT Visit: 1 Visit OT Evaluation $OT Eval Low Complexity: 1 Low OT Treatments $Self Care/Home Management : 5-00 mins 6 clicks Kambri Dismore 02/05/2018, 9:10 AM

## 2018-02-05 NOTE — Progress Notes (Signed)
PROGRESS NOTE    Henry Morrison  XIP:[id number] DOB: 1945-08-23 DOA: 02/03/2018 PCP: Leanna Battles, MD     Brief Narrative:  72 y.o. male with a past medical history significant for coronary artery disease, history of a skin cancer, hyperlipidemia, hypertension and degenerative joint disease; who presented to the emergency department secondary to lower extremity weakness and paresthesia.  Patient reported having his flu shot a week prior to admission and apparently 4 days after flu shot he was experiencing some paresthesia in his lower extremities, the discomfort extended into the fingertip of his hands and he was seen in the emergency department for that.  At that time he received fluid resuscitation no acute abnormalities were found and they attributed his symptoms to mild dehydration and prolonged heat exposure.  Despite some improvement the next 8 symptoms are started to come back and on the day of admission he was essentially having difficulty walking and even had a mild episode of falling in the bathroom.  He did not injure himself from the fall. Patient was evaluated by PCP who got concern for GBS and send the patient back to the emergency department for further evaluation and management.   Assessment & Plan: 1-ascending paralysis ([place]) -In the setting of GBS -Continue IVIG as per neurology recommendation (day 3/5) -Continue supportive care and have PT/OT has seen patient and recommending HH services Vs CIR. Will see progress and further improvement to make final decision.   2-CAD (coronary artery disease) -No chest pain or shortness of breath -Continue statins, imdur, beta-blocker and ACE inhibitor.  3-Familial hyperlipidemia, high LDL -Continue statins and Zetia  4-essential hypertension -Patient with episode of hypotension -Most likely associated with autonomic dysfunction, as Potential reaction from IVIG. -Will continue current antihypertensive regiment, but will discontinue  Altace. -IV fluid bolus has been given. -Heart healthy diet has been ordered.  5-DJD (degenerative joint disease) of knee -Patient with history of arthroplasty -Continue PRN pain medication  -Evaluation by PT/OT has been requested.  6-Hyperglycemia: In the setting of prediabetes -Patient A1c 6.2 -Diet modifications discussed with patient -At this moment no need for hypoglycemic regimen. -Continue to monitor CBGs.   DVT prophylaxis: Heparin. Code Status: Full code Family Communication: Wife at bedside. Disposition Plan: Remains inpatient, continue IVIG infusion as per neurology recommendations; physical therapy and Occupational Therapy assessment appreciated; will follow recommendations.   Consultants:   Neurology  Procedures:   LP 02/03/2018.  Antimicrobials:  Anti-infectives (From admission, onward)   None       Subjective: No fever, no chest pain, no nausea, no vomiting.  Patient denies shortness of breath and is overall expressing improvement in his symptoms.  Still feeling weak and during ambulation today felt lightheaded.  Objective: Vitals:   02/05/18 0505 02/05/18 0914 02/05/18 1230 02/05/18 1231  BP: (!) 144/81 (!) 158/89 (!) 80/64 (!) 88/58  Pulse:  75 90 88  Resp:  (!) 22 (!) 26 (!) 22  Temp: 98.1 F (36.7 C) 98.1 F (36.7 C)  98.1 F (36.7 C)  TempSrc: Oral Oral  Oral  SpO2:   98% 99%  Weight:      Height:        Intake/Output Summary (Last 24 hours) at 02/05/2018 1535 Last data filed at 02/05/2018 1300 Gross per 24 hour  Intake 661.42 ml  Output 1650 ml  Net -988.58 ml   Filed Weights   02/03/18 1616  Weight: 90 kg    Examination: General exam: Alert, awake, oriented x 3; no  chest pain, no shortness of breath, no nausea, no vomiting.  Patient reports no aches and continue him improvement in his tingling/numbness sensation affecting the legs.  He experienced an episode of lightheadedness while ambulating today. Respiratory system: Clear to  auscultation. Respiratory effort normal. Cardiovascular system:RRR. No murmurs, rubs, gallops. Gastrointestinal system: Abdomen is nondistended, soft and nontender. No organomegaly or masses felt. Normal bowel sounds heard. Central nervous system: Alert and oriented.  Patient with decreased sensation bilateral soles and palms; reported tingling of his fingers and is having 4 out of 5 muscle strength on his lower extremities bilaterally.  No other focal neurological deficits.  Extremities: No C/C/E, +pedal pulses Skin: No rashes, lesions or ulcers Psychiatry: Judgement and insight appear normal. Mood & affect appropriate.    Data Reviewed: I have personally reviewed following labs and imaging studies  CBC: Recent Labs  Lab 01/31/18 1738 02/03/18 1041  WBC 11.3* 7.7  HGB 13.8 14.2  HCT 43.7 44.3  MCV 94.6 92.9  PLT 273 737   Basic Metabolic Panel: Recent Labs  Lab 01/31/18 1738 02/03/18 1041  NA 142 140  K 4.9 4.5  CL 107 105  CO2 27 22  GLUCOSE 100* 183*  BUN 13 22  CREATININE 0.92 0.92  CALCIUM 10.0 10.4*   GFR: Estimated Creatinine Clearance: 78.4 mL/min (by C-G formula based on SCr of 0.92 mg/dL).   Liver Function Tests: Recent Labs  Lab 02/03/18 1041  AST 36  ALT 44  ALKPHOS 74  BILITOT 0.6  PROT 6.9  ALBUMIN 4.0   Cardiac Enzymes: Recent Labs  Lab 01/31/18 1740  CKTOTAL 604*   HbA1C: Recent Labs    02/03/18 1041  HGBA1C 6.2*   Thyroid Function Tests: Recent Labs    02/03/18 1041  TSH 1.369   Urine analysis:    Component Value Date/Time   COLORURINE YELLOW 01/31/2018 1745   APPEARANCEUR CLEAR 01/31/2018 1745   LABSPEC 1.015 01/31/2018 1745   PHURINE 5.0 01/31/2018 1745   GLUCOSEU NEGATIVE 01/31/2018 1745   HGBUR SMALL (A) 01/31/2018 1745   BILIRUBINUR NEGATIVE 01/31/2018 1745   KETONESUR NEGATIVE 01/31/2018 1745   PROTEINUR NEGATIVE 01/31/2018 1745   NITRITE NEGATIVE 01/31/2018 1745   LEUKOCYTESUR NEGATIVE 01/31/2018 1745     Recent Results (from the past 240 hour(s))  CSF culture     Status: None (Preliminary result)   Collection Time: 02/03/18 12:41 PM  Result Value Ref Range Status   Specimen Description CSF  Final   Special Requests NONE  Final   Gram Stain   Final    CYTOSPIN SMEAR WBC PRESENT, PREDOMINANTLY MONONUCLEAR NO ORGANISMS SEEN    Culture   Final    NO GROWTH 2 DAYS Performed at [facility], [place] [address] [place], [place] [address]    Report Status PENDING  Incomplete  MRSA PCR Screening     Status: None   Collection Time: 02/03/18  4:24 PM  Result Value Ref Range Status   MRSA by PCR NEGATIVE NEGATIVE Final    Comment:        The GeneXpert MRSA Assay (FDA approved for NASAL specimens only), is one component of a comprehensive MRSA colonization surveillance program. It is not intended to diagnose MRSA infection nor to guide or monitor treatment for MRSA infections. Performed at [facility], [place] [address] [place], [place] [address]      Scheduled Meds: . atorvastatin  80 mg Oral Daily  . ezetimibe  10 mg Oral  Daily  . heparin  5,000 Units Subcutaneous Q8H  . Immune Globulin 10%  400 mg/kg Intravenous Q24 Hr x 5  . isosorbide mononitrate  30 mg Oral Daily  . metoprolol succinate  25 mg Oral QHS  . multivitamin with minerals  1 tablet Oral Daily   Continuous Infusions: . dextrose Stopped (02/04/18 2224)     LOS: 2 days    Time spent: 30 minutes.   Barton Dubois, MD [facility] Pager [phone]  If 7PM-7AM, please contact night-coverage [email or web address] Password [facility] 02/05/2018, 3:35 PM

## 2018-02-05 NOTE — Progress Notes (Signed)
CCMD called to report 6 beat run of v tach at 0932.  Pt currently back to NSR.  Asymptomatic.  Provider Dr.Madera, MD notified.

## 2018-02-05 NOTE — Progress Notes (Signed)
Physical Therapy Treatment Patient Details Name: Henry Morrison MRN: [id number] DOB: 1946-03-19 Today's Date: 02/05/2018    History of Present Illness 72 y.o. male with a past medical history significant for coronary artery disease, history of a skin cancer, hyperlipidemia, hypertension and degenerative joint disease; who presented to the emergency department secondary to lower extremity weakness and paresthesia.  Patient reported having his flu shot a week prior to admission. History and exam consistent with Guillain-Barr.     PT Comments    Pt making steady progress but continues to have significant weakness impacting function. Pt reports strength feels about the same today as yesterday. Currently feel pt would be a good candidate for CIR. If his strength begins to return more rapidly he may not need CIR but at this time feel he could benefit from CIR to return to his prior high level of function.    Follow Up Recommendations  Supervision for mobility/OOB;CIR     Equipment Recommendations  None recommended by PT    Recommendations for Other Services       Precautions / Restrictions Precautions Precautions: Fall Restrictions Weight Bearing Restrictions: No    Mobility  Bed Mobility Overal bed mobility: Needs Assistance Bed Mobility: Supine to Sit     Supine to sit: Min guard;HOB elevated     General bed mobility comments: Use of rail and incr time and effort  Transfers Overall transfer level: Needs assistance Equipment used: Rolling walker (2 wheeled) Transfers: Sit to/from Stand Sit to Stand: Mod assist;Min assist         General transfer comment: Assist to power up into stand. Assist varied between min to mod depending on fatigue level. Mod assist to control descent from stand to sit. Verbal cues for hand placement.  Ambulation/Gait Ambulation/Gait assistance: Min assist;Mod assist;+2 safety/equipment Gait Distance (Feet): 75 Feet(75' x 2, 25' x 1) Assistive  device: Rolling walker (2 wheeled) Gait Pattern/deviations: Step-through pattern;Decreased stride length;Narrow base of support Gait velocity: decreased Gait velocity interpretation: <1.31 ft/sec, indicative of household ambulator General Gait Details: Pt with heavy reliance on UE's on walker. Pt also with knees locked with stance phase to prevent buckling. Verbal cues to widen base of support. As pt fatigues requires more assist due to knee instability and tendency to almost scissor feet.    Stairs             Wheelchair Mobility    Modified Rankin (Stroke Patients Only)       Balance Overall balance assessment: Needs assistance Sitting-balance support: No upper extremity supported;Feet supported Sitting balance-Leahy Scale: Good     Standing balance support: Bilateral upper extremity supported;During functional activity Standing balance-Leahy Scale: Poor Standing balance comment: heavy reliance on RW and min assist for static standing                            Cognition Arousal/Alertness: Awake/alert Behavior During Therapy: WFL for tasks assessed/performed Overall Cognitive Status: Within Functional Limits for tasks assessed                                        Exercises      General Comments General comments (skin integrity, edema, etc.): son present throughout      Pertinent Vitals/Pain Pain Assessment: No/denies pain    Home Living  Prior Function            PT Goals (current goals can now be found in the care plan section) Progress towards PT goals: Progressing toward goals    Frequency    Min 3X/week      PT Plan Discharge plan needs to be updated    Co-evaluation              AM-PAC PT "6 Clicks" Daily Activity  Outcome Measure  Difficulty turning over in bed (including adjusting bedclothes, sheets and blankets)?: A Little Difficulty moving from lying on back to sitting  on the side of the bed? : A Lot Difficulty sitting down on and standing up from a chair with arms (e.g., wheelchair, bedside commode, etc,.)?: Unable Help needed moving to and from a bed to chair (including a wheelchair)?: A Little Help needed walking in hospital room?: A Lot Help needed climbing 3-5 steps with a railing? : A Lot 6 Click Score: 13    End of Session Equipment Utilized During Treatment: Gait belt Activity Tolerance: Patient tolerated treatment well Patient left: in chair;with call bell/phone within reach;with family/visitor present;with chair alarm set Nurse Communication: Mobility status PT Visit Diagnosis: Other abnormalities of gait and mobility (R26.89);Difficulty in walking, not elsewhere classified (R26.2)     Time: 2440-1027 PT Time Calculation (min) (ACUTE ONLY): 22 min  Charges:  $Gait Training: 8-22 mins                     [place] Pager [phone] Office [place] 02/05/2018, 1:30 PM

## 2018-02-05 NOTE — Plan of Care (Signed)
  Problem: Clinical Measurements: Goal: Cardiovascular complication will be avoided Outcome: Progressing   Problem: Clinical Measurements: Goal: Respiratory complications will improve Outcome: Progressing   Problem: Clinical Measurements: Goal: Ability to maintain clinical measurements within normal limits will improve Outcome: Progressing   

## 2018-02-05 NOTE — Progress Notes (Signed)
Rehab Admissions Coordinator Note:  Patient was screened by [name] for appropriateness for an Inpatient Acute Rehab Consult.  At this time, we are recommending Inpatient Rehab consult. AC will contact MD regarding IP Rehab Consult Order.   [name] 02/05/2018, 6:02 PM  I can be reached at [phone].

## 2018-02-06 LAB — CSF CULTURE W GRAM STAIN

## 2018-02-06 LAB — CSF CULTURE: CULTURE: NO GROWTH

## 2018-02-06 MED ORDER — OXYCODONE HCL 5 MG PO TABS
5.0000 mg | ORAL_TABLET | Freq: Four times a day (QID) | ORAL | Status: DC | PRN
  Administered 2018-02-07 (×2): 5 mg via ORAL
  Filled 2018-02-06 (×2): qty 1

## 2018-02-06 MED ORDER — RAMIPRIL 10 MG PO CAPS
10.0000 mg | ORAL_CAPSULE | Freq: Every day | ORAL | Status: DC
  Administered 2018-02-06 – 2018-02-09 (×4): 10 mg via ORAL
  Filled 2018-02-06 (×4): qty 1

## 2018-02-06 MED ORDER — SODIUM CHLORIDE 0.9 % IV SOLN: INTRAVENOUS | Status: DC

## 2018-02-06 MED ORDER — DEXTROSE 5 % IV SOLN
Freq: Once | INTRAVENOUS | Status: AC
  Administered 2018-02-06: 21:00:00 via INTRAVENOUS

## 2018-02-06 MED ORDER — TRAMADOL HCL 50 MG PO TABS
50.0000 mg | ORAL_TABLET | Freq: Three times a day (TID) | ORAL | Status: DC | PRN
  Administered 2018-02-06 – 2018-02-09 (×5): 50 mg via ORAL
  Filled 2018-02-06 (×6): qty 1

## 2018-02-06 NOTE — Progress Notes (Signed)
[place] TEAM 1 - Stepdown/ICU TEAM  RICKY GALLERY  NWG:[id number] DOB: 09/24/1945 DOA: 02/03/2018 PCP: Leanna Battles, MD    Brief Narrative:  72 y.o.malewith a hx of coronary artery disease, skin cancer, hyperlipidemia, hypertension and degenerative joint disease who presented to the ED w/ lower extremity weakness and paresthesia. Patient had a flu shot a week prior to admission and 4 days later began experiencing paresthesias in his lower extremities. When the discomfort extended into his fingertip he was seen in the ED, but sent home. 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   Subjective: Pt does not feel that his weakness or parathesias have significantly improved, but also does not feel they have gotten any worse.  Denies cp, sob, or n/v.  C/o severe back pain which is not a chronic issue for him.  He feels it is due to the hospital beds.    Assessment & Plan:  Ascending paralysis - GBS continue IVIG as per Neurology - to complete 5 tx cycles on 9/25  CAD Asymptomatic   HLD Continue statin and Zetia  HTN BP trending up - adjust tx and monitor  DJD   Hyperglycemia A1c 6.2  DVT prophylaxis: SQ heparin  Code Status: FULL CODE Family Communication: spoke w/ granddaughter at bedside  Disposition Plan: SDU  Consultants:  Neurology   Antimicrobials:  none   Objective: Blood pressure (!) 148/92, pulse 75, temperature 97.7 F (36.5 C), temperature source Oral, resp. rate (!) 28, height 5\' 11"  (1.803 m), weight 90 kg, SpO2 98 %.  Intake/Output Summary (Last 24 hours) at 02/06/2018 1516 Last data filed at 02/06/2018 1017 Gross per 24 hour  Intake 720 ml  Output 1175 ml  Net -455 ml   Filed Weights   02/03/18 1616  Weight: 90 kg    Examination: General: No acute respiratory distress Lungs: Clear to auscultation bilaterally without wheezes or  crackles Cardiovascular: Regular rate and rhythm without murmur gallop or rub normal S1 and S2 Abdomen: Nontender, nondistended, soft, bowel sounds positive, no rebound, no ascites, no appreciable mass Extremities: No significant cyanosis, clubbing, or edema bilateral lower extremities  CBC: Recent Labs  Lab 01/31/18 1738 02/03/18 1041  WBC 11.3* 7.7  HGB 13.8 14.2  HCT 43.7 44.3  MCV 94.6 92.9  PLT 273 578   Basic Metabolic Panel: Recent Labs  Lab 01/31/18 1738 02/03/18 1041  NA 142 140  K 4.9 4.5  CL 107 105  CO2 27 22  GLUCOSE 100* 183*  BUN 13 22  CREATININE 0.92 0.92  CALCIUM 10.0 10.4*   GFR: Estimated Creatinine Clearance: 78.4 mL/min (by C-G formula based on SCr of 0.92 mg/dL).  Liver Function Tests: Recent Labs  Lab 02/03/18 1041  AST 36  ALT 44  ALKPHOS 74  BILITOT 0.6  PROT 6.9  ALBUMIN 4.0    Cardiac Enzymes: Recent Labs  Lab 01/31/18 1740  CKTOTAL 604*    HbA1C: Hgb A1c MFr Bld  Date/Time Value Ref Range Status  02/03/2018 10:41 AM 6.2 (H) 4.8 - 5.6 % Final    Comment:    (NOTE) Pre diabetes:          5.7%-6.4% Diabetes:              >6.4% Glycemic control for   <7.0% adults with diabetes   06/11/2015 04:25 AM 5.9 (H) 4.8 - 5.6 %  Final    Comment:    (NOTE)         Pre-diabetes: 5.7 - 6.4         Diabetes: >6.4         Glycemic control for adults with diabetes: <7.0      Recent Results (from the past 240 hour(s))  CSF culture     Status: None   Collection Time: 02/03/18 12:41 PM  Result Value Ref Range Status   Specimen Description CSF  Final   Special Requests NONE  Final   Gram Stain   Final    CYTOSPIN SMEAR WBC PRESENT, PREDOMINANTLY MONONUCLEAR NO ORGANISMS SEEN    Culture   Final    NO GROWTH 3 DAYS Performed at [facility], [address], [place], [place] [address]    Report Status 02/06/2018 FINAL  Final  MRSA PCR Screening     Status: None   Collection Time: 02/03/18  4:24 PM  Result Value Ref  Range Status   MRSA by PCR NEGATIVE NEGATIVE Final    Comment:        The GeneXpert MRSA Assay (FDA approved for NASAL specimens only), is one component of a comprehensive MRSA colonization surveillance program. It is not intended to diagnose MRSA infection nor to guide or monitor treatment for MRSA infections. Performed at [facility], [place] [address] [place], [place] [address]      Scheduled Meds: . atorvastatin  80 mg Oral Daily  . ezetimibe  10 mg Oral Daily  . heparin  5,000 Units Subcutaneous Q8H  . Immune Globulin 10%  400 mg/kg Intravenous Q24 Hr x 5  . isosorbide mononitrate  30 mg Oral Daily  . metoprolol succinate  25 mg Oral QHS  . multivitamin with minerals  1 tablet Oral Daily     LOS: 3 days   Cherene Altes, MD [facility] Office  [phone] Pager - Text Page per Amion  If 7PM-7AM, please contact night-coverage per Amion 02/06/2018, 3:16 PM

## 2018-02-06 NOTE — Progress Notes (Addendum)
Physical Therapy Treatment Patient Details Name: Henry Morrison MRN: [id number] DOB: 1945/12/01 Today's Date: 02/06/2018    History of Present Illness 72 y.o. male with a past medical history significant for coronary artery disease, history of a skin cancer, hyperlipidemia, hypertension and degenerative joint disease; who presented to the emergency department secondary to lower extremity weakness and paresthesia.  Patient reported having his flu shot a week prior to admission. History and exam consistent with Guillain-Barr.     PT Comments    Pt performed gait training and functional mobility remains to require min to moderate assistance due to decreased strength in extremeties.  Progressed patient to supine LE strengthening program and issued HEP with education on frequency.  Pt remains to require CIR for intense rehab before progressing to home.   Follow Up Recommendations  Supervision for mobility/OOB;CIR     Equipment Recommendations  None recommended by PT    Recommendations for Other Services OT consult     Precautions / Restrictions Precautions Precautions: Fall Restrictions Weight Bearing Restrictions: No    Mobility  Bed Mobility Overal bed mobility: Needs Assistance Bed Mobility: Supine to Sit     Supine to sit: HOB elevated;Supervision     General bed mobility comments: Use of rail and incr time and effort  Transfers Overall transfer level: Needs assistance Equipment used: Rolling walker (2 wheeled) Transfers: Sit to/from Stand Sit to Stand: Mod assist;Min assist         General transfer comment: Cues for hand placement to push from seated surface.  Required mod assistance to eccentrically load and min assist to ascend from seated surface.    Ambulation/Gait Ambulation/Gait assistance: Min assist;Mod assist;+2 safety/equipment(initially min assistance but as patient fatigues he required moderate assistance to maintain balance during gait training.  ) Gait Distance (Feet): 40 Feet(x2 with seated rest break mid way.  ) Assistive device: Rolling walker (2 wheeled) Gait Pattern/deviations: Step-through pattern;Decreased stride length;Narrow base of support;Decreased dorsiflexion - right;Decreased dorsiflexion - left Gait velocity: decreased   General Gait Details: Pt with heavy reliance on UE's on walker. Verbal cues to widen base of support. As pt fatigues requires more assist due to knee instability and tendency to almost scissor feet.   Scissoring occurs as L over R.  LLE is significantly weaker.  Pt required cues for B dorsiflexion to avoid buckling and promote activation of quads in stance phase.     Stairs             Wheelchair Mobility    Modified Rankin (Stroke Patients Only)       Balance     Sitting balance-Leahy Scale: Good       Standing balance-Leahy Scale: Poor                              Cognition Arousal/Alertness: Awake/alert Behavior During Therapy: WFL for tasks assessed/performed Overall Cognitive Status: Within Functional Limits for tasks assessed                                        Exercises General Exercises - Lower Extremity Ankle Circles/Pumps: AROM;Both;20 reps;Supine Quad Sets: AROM;Both;10 reps;Supine Short Arc Quad: AROM;Both;10 reps;Supine Heel Slides: AROM;Both;10 reps;Supine(cues to avoid external rotation during knee flexion.  ) Hip ABduction/ADduction: AROM;Both;10 reps;Supine Straight Leg Raises: AROM;Both;AAROM;10 reps;Supine(with gt belt to assist.  )  General Comments        Pertinent Vitals/Pain Pain Assessment: 0-10 Pain Score: 7  Pain Descriptors / Indicators: Aching Pain Intervention(s): Monitored during session;Repositioned;Ice applied    Home Living                      Prior Function            PT Goals (current goals can now be found in the care plan section) Acute Rehab PT Goals Patient Stated Goal: to get  his sensation back Potential to Achieve Goals: Good Progress towards PT goals: Progressing toward goals    Frequency    Min 4X/week      PT Plan Current plan remains appropriate    Co-evaluation              AM-PAC PT "6 Clicks" Daily Activity  Outcome Measure  Difficulty turning over in bed (including adjusting bedclothes, sheets and blankets)?: A Little Difficulty moving from lying on back to sitting on the side of the bed? : A Little Difficulty sitting down on and standing up from a chair with arms (e.g., wheelchair, bedside commode, etc,.)?: Unable Help needed moving to and from a bed to chair (including a wheelchair)?: A Lot Help needed walking in hospital room?: A Lot Help needed climbing 3-5 steps with a railing? : Total 6 Click Score: 12    End of Session Equipment Utilized During Treatment: Gait belt Activity Tolerance: Patient tolerated treatment well Patient left: in chair;with call bell/phone within reach;with family/visitor present;with chair alarm set Nurse Communication: Mobility status PT Visit Diagnosis: Other abnormalities of gait and mobility (R26.89);Difficulty in walking, not elsewhere classified (R26.2)     Time: 1235-1310 PT Time Calculation (min) (ACUTE ONLY): 35 min  Charges:  $Gait Training: 8-22 mins $Therapeutic Exercise: 8-22 mins                     Governor Rooks, PTA Acute Rehabilitation Services Pager [phone] Office [phone]     Henry Morrison 02/06/2018, 1:40 PM

## 2018-02-06 NOTE — Care Management Important Message (Signed)
Important Message  Patient Details  Name: Henry Morrison MRN: [id number] Date of Birth: 1945-07-18   Medicare Important Message Given:  Yes    Orbie Pyo 02/06/2018, 2:55 PM

## 2018-02-07 ENCOUNTER — Inpatient Hospital Stay (HOSPITAL_COMMUNITY): Payer: Medicare Other

## 2018-02-07 DIAGNOSIS — G610 Guillain-Barre syndrome: Principal | ICD-10-CM

## 2018-02-07 DIAGNOSIS — R7303 Prediabetes: Secondary | ICD-10-CM

## 2018-02-07 DIAGNOSIS — I10 Essential (primary) hypertension: Secondary | ICD-10-CM

## 2018-02-07 DIAGNOSIS — E785 Hyperlipidemia, unspecified: Secondary | ICD-10-CM

## 2018-02-07 DIAGNOSIS — I2510 Atherosclerotic heart disease of native coronary artery without angina pectoris: Secondary | ICD-10-CM

## 2018-02-07 DIAGNOSIS — E871 Hypo-osmolality and hyponatremia: Secondary | ICD-10-CM

## 2018-02-07 LAB — RENAL FUNCTION PANEL
Albumin: 3.3 g/dL — ABNORMAL LOW (ref 3.5–5.0)
Anion gap: 8 (ref 5–15)
BUN: 16 mg/dL (ref 8–23)
CHLORIDE: 99 mmol/L (ref 98–111)
CO2: 26 mmol/L (ref 22–32)
CREATININE: 0.8 mg/dL (ref 0.61–1.24)
Calcium: 9.9 mg/dL (ref 8.9–10.3)
GFR calc Af Amer: >60 mL/min (ref >60)
GFR calc non Af Amer: >60 mL/min (ref >60)
GLUCOSE: 114 mg/dL — AB (ref 70–99)
POTASSIUM: 4.7 mmol/L (ref 3.5–5.1)
Phosphorus: 3.9 mg/dL (ref 2.5–4.6)
Sodium: 133 mmol/L — ABNORMAL LOW (ref 135–145)

## 2018-02-07 LAB — CBC
HEMATOCRIT: 41.7 % (ref 39.0–52.0)
Hemoglobin: 13.4 g/dL (ref 13.0–17.0)
MCH: 29.8 pg (ref 26.0–34.0)
MCHC: 32.1 g/dL (ref 30.0–36.0)
MCV: 92.7 fL (ref 78.0–100.0)
PLATELETS: 238 10*3/uL (ref 150–400)
RBC: 4.5 MIL/uL (ref 4.22–5.81)
RDW: 13.6 % (ref 11.5–15.5)
WBC: 5.9 10*3/uL (ref 4.0–10.5)

## 2018-02-07 MED ORDER — OXYCODONE HCL 5 MG PO TABS
5.0000 mg | ORAL_TABLET | ORAL | Status: DC | PRN
  Administered 2018-02-07 – 2018-02-08 (×3): 5 mg via ORAL
  Administered 2018-02-09: 10 mg via ORAL
  Filled 2018-02-07: qty 1
  Filled 2018-02-07: qty 2
  Filled 2018-02-07 (×2): qty 1

## 2018-02-07 NOTE — Progress Notes (Signed)
Physical Therapy Treatment Patient Details Name: Henry Morrison MRN: [id number] DOB: 1945-07-08 Today's Date: 02/07/2018    History of Present Illness 72 y.o. male with a past medical history significant for coronary artery disease, history of a skin cancer, hyperlipidemia, hypertension and degenerative joint disease; who presented to the emergency department secondary to lower extremity weakness and paresthesia.  Patient reported having his flu shot a week prior to admission. History and exam consistent with Guillain-Barr.     PT Comments    Patient is making progress toward PT goals. Pt continues to require min/mod A for OOB mobility due to bilat LE weakness and decreased sensation. Pt lacks the LE strength needed to ascend one step with assistance of therapist. Pt will continue to benefit from further skilled PT services in both acute and post acute setting and remains a great candidate for CIR level therapies.      Follow Up Recommendations  CIR;Supervision for mobility/OOB     Equipment Recommendations  None recommended by PT    Recommendations for Other Services OT consult     Precautions / Restrictions Precautions Precautions: Fall Restrictions Weight Bearing Restrictions: No    Mobility  Bed Mobility Overal bed mobility: Needs Assistance Bed Mobility: Sit to Supine       Sit to supine: Supervision   General bed mobility comments: supervision for safety  Transfers Overall transfer level: Needs assistance Equipment used: Rolling walker (2 wheeled) Transfers: Sit to/from Stand Sit to Stand: Min assist;Mod assist         General transfer comment: cues for safe hand placement; assist to steady; when returning to seated position pt needed cues for bringing L LE back as he did not realize it was way outside BOS and needed increased assistance for balance  Ambulation/Gait Ambulation/Gait assistance: Min assist;Mod assist;+2 safety/equipment Gait Distance (Feet):  (71ft then 31ft with seated rest break) Assistive device: Rolling walker (2 wheeled) Gait Pattern/deviations: Step-through pattern;Decreased stride length;Narrow base of support;Decreased dorsiflexion - right;Decreased dorsiflexion - left(bilat knee flexion and weakness) Gait velocity: decreased   General Gait Details: pt requires assistance for balance and with heavy reliance on bilat UE support; increased assistance with distance due to fatigue and increased bilat LE weakness   Stairs Stairs: Yes Stairs assistance: Mod assist Stair Management: One rail Right;Sideways;Step to pattern   General stair comments: pt lacks strenght to step up onto step with assistance and use of bilat UE on rail   Wheelchair Mobility    Modified Rankin (Stroke Patients Only)       Balance Overall balance assessment: Needs assistance Sitting-balance support: No upper extremity supported;Feet supported Sitting balance-Leahy Scale: Good     Standing balance support: Bilateral upper extremity supported;During functional activity Standing balance-Leahy Scale: Poor                              Cognition Arousal/Alertness: Awake/alert Behavior During Therapy: WFL for tasks assessed/performed Overall Cognitive Status: Within Functional Limits for tasks assessed                                 General Comments: decreased insight into deficits until stair training today      Exercises      General Comments General comments (skin integrity, edema, etc.): pt reports that he has not been able to work on HEP due to back pain  Pertinent Vitals/Pain Pain Assessment: Faces Faces Pain Scale: Hurts little more Pain Location: back Pain Descriptors / Indicators: Aching Pain Intervention(s): Limited activity within patient's tolerance;Monitored during session;Repositioned    Home Living                      Prior Function            PT Goals (current goals  can now be found in the care plan section) Progress towards PT goals: Progressing toward goals    Frequency    Min 4X/week      PT Plan Current plan remains appropriate    Co-evaluation              AM-PAC PT "6 Clicks" Daily Activity  Outcome Measure  Difficulty turning over in bed (including adjusting bedclothes, sheets and blankets)?: A Little Difficulty moving from lying on back to sitting on the side of the bed? : A Lot Difficulty sitting down on and standing up from a chair with arms (e.g., wheelchair, bedside commode, etc,.)?: Unable Help needed moving to and from a bed to chair (including a wheelchair)?: A Lot Help needed walking in hospital room?: A Lot Help needed climbing 3-5 steps with a railing? : Total 6 Click Score: 11    End of Session Equipment Utilized During Treatment: Gait belt Activity Tolerance: Patient tolerated treatment well Patient left: with call bell/phone within reach;in bed;with bed alarm set Nurse Communication: Mobility status PT Visit Diagnosis: Other abnormalities of gait and mobility (R26.89);Difficulty in walking, not elsewhere classified (R26.2)     Time: 1410-1433 PT Time Calculation (min) (ACUTE ONLY): 23 min  Charges:  $Gait Training: 23-37 mins                     Earney Navy, PTA Acute Rehabilitation Services Pager: [phone] Office: [phone]     Darliss Cheney 02/07/2018, 4:18 PM

## 2018-02-07 NOTE — Progress Notes (Signed)
[facility] TEAM 1 - Stepdown/ICU TEAM  Henry Morrison  NUU:[id number] DOB: Aug 05, 1945 DOA: 02/03/2018 PCP: Leanna Battles, MD    Brief Narrative:  72 y.o.malewith a hx of coronary artery disease, skin cancer, hyperlipidemia, hypertension and degenerative joint disease who presented to the ED w/ lower extremity weakness and paresthesia. Patient had a flu shot a week prior to admission and 4 days later began experiencing paresthesias in his lower extremities. When the discomfort extended into his fingertip he was seen in the ED, but sent home. 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   Subjective: C/o severe unrelenting pain in the low back, specifically focused at the site of his LP.  He feels his paraesthesias are now signif improved, and his weakness is slightly improved.  He denies cp, n/v,, or abdom pain.    Assessment & Plan:  Ascending paralysis - GBS continue IVIG as per Neurology - to complete 5 tx cycles on 9/25 - cont PT/OT - may require CIR stay   New low back pain Focus of pain is at site of LP - no remarkable findings superficially on exam - will image lumbar spine w/ CT to r/o deep hematoma or similar complication   CAD Asymptomatic   HLD Continue statin and Zetia  HTN BP uncontrolled - likely related to worsening LBP - tx pain - titrate BP meds   Hyperglycemia A1c 6.2  DVT prophylaxis: SQ heparin  Code Status: FULL CODE Family Communication: no family present at time of exam today  Disposition Plan: stable for tele bed   Consultants:  Neurology   Antimicrobials:  none   Objective: Blood pressure (!) 173/106, pulse 72, temperature 98.4 F (36.9 C), temperature source Oral, resp. rate (!) 21, height 5\' 11"  (1.803 m), weight 90 kg, SpO2 97 %.  Intake/Output Summary (Last 24 hours) at 02/07/2018 1118 Last data filed at 02/07/2018 1011 Gross per 24  hour  Intake 1070 ml  Output 1525 ml  Net -455 ml   Filed Weights   02/03/18 1616  Weight: 90 kg    Examination: General: No acute respiratory distress - alert and oriented  Lungs: CTA B - no wheezing  Cardiovascular: RRR - no M or rub  Abdomen: NT/ND, soft, bs+, no mass  Extremities: no signif C/C/E B LE - no hematoma or erythema or induration at site of LP  CBC: Recent Labs  Lab 01/31/18 1738 02/03/18 1041 02/07/18 0502  WBC 11.3* 7.7 5.9  HGB 13.8 14.2 13.4  HCT 43.7 44.3 41.7  MCV 94.6 92.9 92.7  PLT 273 273 347   Basic Metabolic Panel: Recent Labs  Lab 01/31/18 1738 02/03/18 1041 02/07/18 0502  NA 142 140 133*  K 4.9 4.5 4.7  CL 107 105 99  CO2 27 22 26   GLUCOSE 100* 183* 114*  BUN 13 22 16   CREATININE 0.92 0.92 0.80  CALCIUM 10.0 10.4* 9.9  PHOS  --   --  3.9   GFR: Estimated Creatinine Clearance: 90.2 mL/min (by C-G formula based on SCr of 0.8 mg/dL).  Liver Function Tests: Recent Labs  Lab 02/03/18 1041 02/07/18 0502  AST 36  --   ALT 44  --   ALKPHOS 74  --   BILITOT 0.6  --   PROT 6.9  --   ALBUMIN 4.0 3.3*    Cardiac Enzymes: Recent Labs  Lab  01/31/18 1740  CKTOTAL 604*    HbA1C: Hgb A1c MFr Bld  Date/Time Value Ref Range Status  02/03/2018 10:41 AM 6.2 (H) 4.8 - 5.6 % Final    Comment:    (NOTE) Pre diabetes:          5.7%-6.4% Diabetes:              >6.4% Glycemic control for   <7.0% adults with diabetes   06/11/2015 04:25 AM 5.9 (H) 4.8 - 5.6 % Final    Comment:    (NOTE)         Pre-diabetes: 5.7 - 6.4         Diabetes: >6.4         Glycemic control for adults with diabetes: <7.0      Recent Results (from the past 240 hour(s))  CSF culture     Status: None   Collection Time: 02/03/18 12:41 PM  Result Value Ref Range Status   Specimen Description CSF  Final   Special Requests NONE  Final   Gram Stain   Final    CYTOSPIN SMEAR WBC PRESENT, PREDOMINANTLY MONONUCLEAR NO ORGANISMS SEEN    Culture   Final     NO GROWTH 3 DAYS Performed at [facility], [address], [place], [place] [address]    Report Status 02/06/2018 FINAL  Final  MRSA PCR Screening     Status: None   Collection Time: 02/03/18  4:24 PM  Result Value Ref Range Status   MRSA by PCR NEGATIVE NEGATIVE Final    Comment:        The GeneXpert MRSA Assay (FDA approved for NASAL specimens only), is one component of a comprehensive MRSA colonization surveillance program. It is not intended to diagnose MRSA infection nor to guide or monitor treatment for MRSA infections. Performed at [facility], [place] [address] [place], [place] [address]      Scheduled Meds: . atorvastatin  80 mg Oral Daily  . ezetimibe  10 mg Oral Daily  . heparin  5,000 Units Subcutaneous Q8H  . Immune Globulin 10%  400 mg/kg Intravenous Q24 Hr x 5  . isosorbide mononitrate  30 mg Oral Daily  . metoprolol succinate  25 mg Oral QHS  . multivitamin with minerals  1 tablet Oral Daily  . ramipril  10 mg Oral Daily     LOS: 4 days   Cherene Altes, MD [facility] Office  [phone] Pager - Text Page per Amion  If 7PM-7AM, please contact night-coverage per Amion 02/07/2018, 11:18 AM

## 2018-02-07 NOTE — Consult Note (Signed)
Physical Medicine and Rehabilitation Consult Reason for Consult: Decreased functional mobility Referring Physician: [facility]   HPI: Henry Morrison is a 72 y.o. right-handed male with history of CAD status post stenting, skin cancer, hyperlipidemia, hypertension.  Per chart review and patient, patient lives with spouse.  Independent and active prior to admission.  Two-level home with bed and bath upstairs.  3 steps to entry.  Good support of wife and extended family.  Presented 02/03/2018 with lower extremity weakness and paresthesia.  Patient had a flu shot approximately 1 week prior to admission and 4 days later began experiencing paresthesias in the lower extremities.  Cranial CT scan reviewed, unremarkable for acute intracranial process. CK elevated 604.  Lumbar puncture completed showed protein of 58 and a cell count of 2.  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  Subcutaneous heparin for DVT prophylaxis.  Tolerating a regular diet.  Therapy evaluations completed with recommendations of physical medicine rehab consult.   Review of Systems  Constitutional: Negative for chills and fever.  HENT: Negative for hearing loss.   Eyes: Negative for blurred vision and double vision.  Respiratory: Negative for cough and shortness of breath.   Cardiovascular: Negative for chest pain, palpitations and leg swelling.  Gastrointestinal: Positive for constipation. Negative for nausea and vomiting.  Genitourinary: Negative for dysuria, flank pain and hematuria.  Musculoskeletal: Positive for myalgias.  Skin: Negative for rash.  Neurological: Positive for tingling, sensory change, focal weakness and weakness.  All other systems reviewed and are negative.  Past Medical History:  Diagnosis Date  . Arthritis   . CAD (coronary artery disease)   . Cancer ([place])    skin cancer   . Hyperlipidemia   . Primary localized osteoarthritis of right knee 06/09/2015    Past Surgical History:  Procedure Laterality Date  . COLONOSCOPY    . CORONARY ANGIOPLASTY WITH STENT PLACEMENT  10/2000   stenting of the CX vessel  . KNEE ARTHROSCOPY W/ MENISCECTOMY Left 07/15/2006  . KNEE ARTHROSCOPY W/ MENISCECTOMY Right 02/09/2012  . removal of skin cancer     right arm  . TOTAL KNEE ARTHROPLASTY Right 06/09/2015   Procedure: RIGHT TOTAL KNEE ARTHROPLASTY;  Surgeon: Elsie Saas, MD;  Location: [facility];  Service: Orthopedics;  Laterality: Right;  . US ECHOCARDIOGRAPHY  03/07/2012   Trace AI,mild MR,mild to mod TR   Family History  Problem Relation Age of Onset  . Heart disease Mother   . Cancer Father        kidney   Social History:  reports that he has never smoked. He has never used smokeless tobacco. He reports that he drinks about 10.0 standard drinks of alcohol per week. He reports that he does not use drugs. Allergies:  Allergies  Allergen Reactions  . Influenza Vaccines     Guillain-Barr  . Other     Seasonal allergies   Medications Prior to Admission  Medication Sig Dispense Refill  . acetaminophen (TYLENOL) 500 MG tablet Take 1,000 mg by mouth daily as needed (for pain).     Marland Kitchen atorvastatin (LIPITOR) 80 MG tablet take 1 tablet by mouth once daily 30 tablet 11  . Evolocumab (REPATHA SURECLICK) 546 MG/ML SOAJ Inject 140 mg into the skin every 14 (fourteen) days. 2 pen 12  . ezetimibe (ZETIA) 10 MG tablet take 1 tablet by mouth once daily 30 tablet 11  . fluticasone (FLONASE) 50 MCG/ACT nasal spray Place 1 spray  into both nostrils as needed for allergies.     . isosorbide mononitrate (IMDUR) 30 MG 24 hr tablet take 1 tablet by mouth once daily 30 tablet 11  . metoprolol succinate (TOPROL-XL) 25 MG 24 hr tablet take 1 tablet by mouth once daily 30 tablet 11  . Multiple Vitamin (MULTIVITAMIN) tablet Take 1 tablet by mouth daily.    . ramipril (ALTACE) 10 MG capsule take 1 capsule by mouth once daily 30 capsule 11    Home: Home  Living Family/patient expects to be discharged to:: Private residence Living Arrangements: Spouse/significant other Available Help at Discharge: Family, Available 24 hours/day Type of Home: House Home Access: Stairs to enter [employer] of Steps: 3 Entrance Stairs-Rails: Right, Left Home Layout: Two level, Bed/bath upstairs Alternate Level Stairs-Number of Steps: 13 Alternate Level Stairs-Rails: Right Bathroom Shower/Tub: Gaffer, Door [employer] Toilet: Standard Bathroom Accessibility: Yes Home Equipment: Environmental consultant - 2 wheels, Cane - single point, Crutches, Hand held shower head, Shower seat  Functional History: Prior Function Level of Independence: Independent Comments: active Functional Status:  Mobility: Bed Mobility Overal bed mobility: Needs Assistance Bed Mobility: Supine to Sit Supine to sit: HOB elevated, Supervision General bed mobility comments: Use of rail and incr time and effort Transfers Overall transfer level: Needs assistance Equipment used: Rolling walker (2 wheeled) Transfers: Sit to/from Stand Sit to Stand: Mod assist, Min assist Stand pivot transfers: Min assist General transfer comment: Cues for hand placement to push from seated surface.  Required mod assistance to eccentrically load and min assist to ascend from seated surface.   Ambulation/Gait Ambulation/Gait assistance: Min assist, Mod assist, +2 safety/equipment(initially min assistance but as patient fatigues he required moderate assistance to maintain balance during gait training. ) Gait Distance (Feet): 40 Feet(x2 with seated rest break mid way.  ) Assistive device: Rolling walker (2 wheeled) Gait Pattern/deviations: Step-through pattern, Decreased stride length, Narrow base of support, Decreased dorsiflexion - right, Decreased dorsiflexion - left General Gait Details: Pt with heavy reliance on UE's on walker. Verbal cues to widen base of support. As pt fatigues requires more assist  due to knee instability and tendency to almost scissor feet.   Scissoring occurs as L over R.  LLE is significantly weaker.  Pt required cues for B dorsiflexion to avoid buckling and promote activation of quads in stance phase.   Gait velocity: decreased Gait velocity interpretation: <1.31 ft/sec, indicative of household ambulator    ADL: ADL Overall ADL's : Needs assistance/impaired Eating/Feeding: Set up Grooming: Wash/dry hands, Wash/dry face, Moderate assistance Grooming Details (indicate cue type and reason): Patient needs assist with opening containers and shaving Upper Body Bathing: Minimal assistance, Sitting Lower Body Bathing: Minimal assistance, Sit to/from stand Upper Body Dressing : Minimal assistance, Sitting Lower Body Dressing: Minimal assistance Toilet Transfer: Minimal assistance, Stand-pivot Toileting- Clothing Manipulation and Hygiene: Moderate assistance Functional mobility during ADLs: Minimal assistance General ADL Comments: Patient is having difficulty with performing ADLs secondary to decreased senation in hands and feet.  Cognition: Cognition Overall Cognitive Status: Within Functional Limits for tasks assessed Orientation Level: Oriented X4 Cognition Arousal/Alertness: Awake/alert Behavior During Therapy: WFL for tasks assessed/performed Overall Cognitive Status: Within Functional Limits for tasks assessed  Blood pressure (!) 156/84, pulse 72, temperature 98.4 F (36.9 C), temperature source Oral, resp. rate 14, height 5\' 11"  (1.803 m), weight 90 kg, SpO2 97 %. Physical Exam  Vitals reviewed. Constitutional: He is oriented to person, place, and time. He appears well-developed and well-nourished.  HENT:  Head:  Normocephalic and atraumatic.  Eyes: EOM are normal. Right eye exhibits no discharge. Left eye exhibits no discharge.  Neck: Normal range of motion. Neck supple. No thyromegaly present.  Cardiovascular: Normal rate, regular rhythm and normal heart  sounds.  Respiratory: Effort normal and breath sounds normal. No respiratory distress.  GI: Soft. Bowel sounds are normal. He exhibits no distension.  Musculoskeletal:  No edema or tenderness in extremities  Neurological: He is alert and oriented to person, place, and time.  Motor: B/l UE 4+/5 proximal to distal B/l LE: HF, KE 4/5, ADF 4+/5 Sensation subjectively diminished to light touch b/l feets and finger tips  Skin: Skin is warm and dry.  Psychiatric: He has a normal mood and affect. His behavior is normal. Thought content normal.    Results for orders placed or performed during the hospital encounter of 02/03/18 (from the past 24 hour(s))  CBC     Status: None   Collection Time: 02/07/18  5:02 AM  Result Value Ref Range   WBC 5.9 4.0 - 10.5 K/uL   RBC 4.50 4.22 - 5.81 MIL/uL   Hemoglobin 13.4 13.0 - 17.0 g/dL   HCT 41.7 39.0 - 52.0 %   MCV 92.7 78.0 - 100.0 fL   MCH 29.8 26.0 - 34.0 pg   MCHC 32.1 30.0 - 36.0 g/dL   RDW 13.6 11.5 - 15.5 %   Platelets 238 150 - 400 K/uL   No results found.  Assessment/Plan: Diagnosis: GBS  Labs and images (see above) independently reviewed.  Records reviewed and summated above.  1. Does the need for close, 24 hr/day medical supervision in concert with the patient's rehab needs make it unreasonable for this patient to be served in a less intensive setting? Yes 2. Co-Morbidities requiring supervision/potential complications: CAD status post stenting (cont meds), skin cancer, hyperlipidemia, HTN (monitor and provide prns in accordance with increased physical exertion and pain), prediabetes (Monitor in accordance with exercise and adjust meds as necessary), hyponatremia (cont to monitor, treat if necessary) 3. Due to safety, disease management and patient education, does the patient require 24 hr/day rehab nursing? Yes 4. Does the patient require coordinated care of a physician, rehab nurse, PT (1-2 hrs/day, 5 days/week) and OT (1-2 hrs/day, 5  days/week) to address physical and functional deficits in the context of the above medical diagnosis(es)? Yes Addressing deficits in the following areas: balance, endurance, locomotion, strength, transferring, bathing, dressing, toileting and psychosocial support 5. Can the patient actively participate in an intensive therapy program of at least 3 hrs of therapy per day at least 5 days per week? Yes 6. The potential for patient to make measurable gains while on inpatient rehab is excellent 7. Anticipated functional outcomes upon discharge from inpatient rehab are modified independent and supervision  with PT, modified independent and supervision with OT, n/a with SLP. 8. Estimated rehab length of stay to reach the above functional goals is: 12-15 days. 9. Anticipated D/C setting: Home 10. Anticipated post D/C treatments: HH therapy and Home excercise program 11. Overall Rehab/Functional Prognosis: excellent  RECOMMENDATIONS: This patient's condition is appropriate for continued rehabilitative care in the following setting: CIR Patient has agreed to participate in recommended program. Yes Note that insurance prior authorization may be required for reimbursement for recommended care.  Comment: Rehab Admissions Coordinator to follow up.  I have personally performed a face to face diagnostic evaluation, including, but not limited to relevant history and physical exam findings, of this patient and developed relevant assessment and  plan.  Additionally, I have reviewed and concur with the physician assistant's documentation above.   Delice Lesch, MD, ABPMR Lavon Paganini Angiulli, PA-C 02/07/2018

## 2018-02-07 NOTE — Progress Notes (Signed)
Occupational Therapy Treatment Patient Details Name: Henry Morrison MRN: [id number] DOB: 1945-06-23 Today's Date: 02/07/2018    History of present illness 72 y.o. male with a past medical history significant for coronary artery disease, history of a skin cancer, hyperlipidemia, hypertension and degenerative joint disease; who presented to the emergency department secondary to lower extremity weakness and paresthesia.  Patient reported having his flu shot a week prior to admission. History and exam consistent with Guillain-Barr.    OT comments  PATIENT WAS SEEN FOR SKILLED OT TO [place] AND SAFETY WITH ADLS AND MOBILITY. PATIENT WAS MIN A WITH LE DRESSING USING CROSS LEG TECHNIQUE. PATIENT WAS MIN A WITH MOBILITY WITH OF WALKER. PATIENT HAD OCCASIONAL KNEE [place]. PATIENT WAS EDUCATED TO PERFORM FINE MOTOR COORDINATION EXERCISES. PATIENT WAS INSTRUCTED TO OPEN CONTAINERS AND TO PRACTICE ZIPPING AND FASTENING GARMENTS.   Follow Up Recommendations       Equipment Recommendations       Recommendations for Other Services      Precautions / Restrictions Precautions Precautions: Fall       Mobility Bed Mobility   Bed Mobility: Supine to Sit     Supine to sit: Supervision        Transfers       Sit to Stand: Min assist Stand pivot transfers: Min assist       General transfer comment: CUES FOR HAND PLACEMENT    Balance                                           ADL either performed or assessed with clinical judgement   ADL                       Lower Body Dressing: Min guard;Sit to/from stand   Toilet Transfer: Min guard;Ambulation   Toileting- Clothing Manipulation and Hygiene: Minimal assistance       Functional mobility during ADLs: Minimal assistance;Rolling walker General ADL Comments: PATIENT WAS      Vision       Perception     Praxis      Cognition Arousal/Alertness: Awake/alert Behavior During Therapy: WFL for  tasks assessed/performed Overall Cognitive Status: Within Functional Limits for tasks assessed                                          Exercises     Shoulder Instructions       General Comments      Pertinent Vitals/ Pain       Pain Assessment: 0-10 Pain Score: 5  Pain Location: (back) Pain Descriptors / Indicators: Aching Pain Intervention(s): Limited activity within patient's tolerance;Monitored during session;Premedicated before session  Home Living                                          Prior Functioning/Environment              Frequency           Progress Toward Goals  OT Goals(current goals can now be found in the care plan section)  Progress towards OT goals: Progressing toward goals  Acute Rehab OT Goals Patient Stated  Goal: TO GET BACK TO NORMAL  Plan Discharge plan remains appropriate    Co-evaluation                 AM-PAC PT "6 Clicks" Daily Activity     Outcome Measure   Help from another person eating meals?: A Little Help from another person taking care of personal grooming?: A Little Help from another person toileting, which includes using toliet, bedpan, or urinal?: A Little Help from another person bathing (including washing, rinsing, drying)?: A Little Help from another person to put on and taking off regular upper body clothing?: A Little Help from another person to put on and taking off regular lower body clothing?: A Little 6 Click Score: 18    End of Session Equipment Utilized During Treatment: Gait belt;Rolling walker      Activity Tolerance Patient tolerated treatment well   Patient Left in chair;with call bell/phone within reach;with chair alarm set   Nurse Communication (OK THERAPY)        Time: 5974-1638 OT Time Calculation (min): 33 min  Charges: OT General Charges $OT Visit: 1 Visit OT Treatments $Self Care/Home Management : 8-22 mins $Therapeutic Activity: 4-53  mins  6 CLICKS   Kieren Ricci 02/07/2018, 12:22 PM

## 2018-02-08 MED ORDER — AMLODIPINE BESYLATE 5 MG PO TABS
5.0000 mg | ORAL_TABLET | Freq: Every day | ORAL | Status: DC
  Administered 2018-02-08 – 2018-02-09 (×2): 5 mg via ORAL
  Filled 2018-02-08 (×2): qty 1

## 2018-02-08 MED ORDER — POLYETHYLENE GLYCOL 3350 17 G PO PACK
17.0000 g | PACK | Freq: Two times a day (BID) | ORAL | Status: DC
  Administered 2018-02-08 – 2018-02-09 (×2): 17 g via ORAL
  Filled 2018-02-08 (×3): qty 1

## 2018-02-08 NOTE — PMR Pre-admission (Signed)
PMR Admission Coordinator Pre-Admission Assessment  Patient: Henry Morrison is an 72 y.o., male MRN: [id number] DOB: [birth year] Height: 5\' 11"  (180.3 cm) Weight: 90 kg              Insurance Information HMO: Yes    PPO:      PCP:      IPA:      80/20:      OTHER: PRIMARY: U[employer] Medicare      Policy#: [id number]      Subscriber: Self CM Name: Vevelyn Royals      Phone#: [phone]     Fax#: [phone] Pre-Cert#: [id number] given 02/09/18 by Anderson Malta (phone:[phone]) for 7 days, 02/09/18-02/15/18 with faxed updates due to Vevelyn Royals   Employer: Retired Benefits:  Phone #: [phone]     Name: Verified online at [email or web address] Eff. Date: 05/17/17     Deduct: $0      Out of Pocket Max: $4400      Life Max: N/A CIR: $345 a day, days 1-5; $0 a day, days 6+      SNF: $0 a day, days 1-20; $160 a day, days 21-48; $0 a day, days 49-100 Outpatient: Necessity      Co-Pay: $40 per visit  Home Health: Necessity       Co-Pay: $0 DME: 80%     Co-Pay: 20% Providers: In-network   SECONDARY: None        Emergency Contact Information Contact Information    Name Relation Home Work Mobile   Takayama,Frances Spouse [phone]  [phone]     Current Medical History  Patient Admitting Diagnosis: GBS   History of Present Illness: Henry Morrison is a 72 year old right-handed male with history of CAD with stenting, skin cancer, hyperlipidemia and hypertension.  Per chart review and patient, patient lives with spouse.  Independent and active prior to admission.  2 level home with bed and bath upstairs.  3 steps to entry of home.  Good support of wife and extended family.  Presented 02/03/2018 with lower extremity weakness and paresthesia.  Patient had a flu shot approximately 1 week prior to admission and 4 days later began experiencing paresthesias in the lower extremities.  Cranial CT scan unremarkable for acute process.  CK elevated 604.  Lumbar puncture completed showing protein of 58 and a cell count of 2.   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  Subcutaneous heparin for DVT prophylaxis.  Tolerating a regular diet.  Therapy evaluations completed with recommendations of physical medicine rehab consult.  Patient was admitted for a comprehensive rehab program 02/09/18.      Past Medical History  Past Medical History:  Diagnosis Date  . Arthritis   . CAD (coronary artery disease)   . Cancer ([place])    skin cancer   . Hyperlipidemia   . Primary localized osteoarthritis of right knee 06/09/2015    Family History  family history includes Cancer in his father; Heart disease in his mother.  Prior Rehab/Hospitalizations:  Has the patient had major surgery during 100 days prior to admission? No  Current Medications   Current Facility-Administered Medications:  .  acetaminophen (TYLENOL) tablet 650 mg, 650 mg, Oral, Q6H PRN, Barton Dubois, MD, 650 mg at 02/06/18 0604 .  amLODipine (NORVASC) tablet 5 mg, 5 mg, Oral, Daily, Elgergawy, Silver Huguenin, MD, 5 mg at 02/09/18 1000 .  atorvastatin (LIPITOR) tablet 80 mg, 80 mg, Oral, Daily, Barton Dubois, MD, 80 mg at 02/09/18 1000 .  diclofenac sodium (VOLTAREN) 1 % transdermal gel 2 g, 2 g, Topical, QID PRN, Bodenheimer, Charles A, NP, 2 g at 02/09/18 1001 .  ezetimibe (ZETIA) tablet 10 mg, 10 mg, Oral, Daily, Barton Dubois, MD, 10 mg at 02/09/18 0959 .  fluticasone (FLONASE) 50 MCG/ACT nasal spray 1 spray, 1 spray, Each Nare, Daily PRN, Barton Dubois, MD .  heparin injection 5,000 Units, 5,000 Units, Subcutaneous, Q8H, Barton Dubois, MD, 5,000 Units at 02/09/18 0524 .  hydrALAZINE (APRESOLINE) injection 10 mg, 10 mg, Intravenous, Q8H PRN, Barton Dubois, MD, 10 mg at 02/08/18 0502 .  isosorbide mononitrate (IMDUR) 24 hr tablet 30 mg, 30 mg, Oral, Daily, Barton Dubois, MD, 30 mg at 02/09/18 0959 .  metoprolol succinate (TOPROL-XL) 24 hr tablet 25 mg, 25 mg, Oral, QHS, Barton Dubois, MD, 25 mg at 02/08/18 2146 .   multivitamin with minerals tablet 1 tablet, 1 tablet, Oral, Daily, Barton Dubois, MD, 1 tablet at 02/09/18 0959 .  oxyCODONE (Oxy IR/ROXICODONE) immediate release tablet 5-10 mg, 5-10 mg, Oral, Q4H PRN, Cherene Altes, MD, 10 mg at 02/09/18 0428 .  polyethylene glycol (MIRALAX / GLYCOLAX) packet 17 g, 17 g, Oral, BID, Elgergawy, Silver Huguenin, MD, 17 g at 02/09/18 1001 .  ramipril (ALTACE) capsule 10 mg, 10 mg, Oral, Daily, Cherene Altes, MD, 10 mg at 02/09/18 1000 .  traMADol (ULTRAM) tablet 50 mg, 50 mg, Oral, Q8H PRN, Cherene Altes, MD, 50 mg at 02/09/18 1000  Patients Current Diet:  Diet Order            Diet Heart Room service appropriate? Yes; Fluid consistency: Thin  Diet effective now              Precautions / Restrictions Precautions Precautions: Fall Restrictions Weight Bearing Restrictions: No   Has the patient had 2 or more falls or a fall with injury in the past year?Yes the fall that lead to this admission   Prior Activity Level Community (5-7x/wk): Prior to admission patient was fully independent and active.  He was out in the community daily and enjoys walking, palying golf, and playing soccer with his grandchildren.   Home Assistive Devices / Equipment Home Assistive Devices/Equipment: None Home Equipment: Environmental consultant - 2 wheels, Cane - single point, Crutches, Hand held shower head, Shower seat  Prior Device Use: Indicate devices/aids used by the patient prior to current illness, exacerbation or injury? None of the above  Prior Functional Level Prior Function Level of Independence: Independent Comments: active  Self Care: Did the patient need help bathing, dressing, using the toilet or eating? Independent  Indoor Mobility: Did the patient need assistance with walking from room to room (with or without device)? Independent  Stairs: Did the patient need assistance with internal or external stairs (with or without device)? Independent  Functional  Cognition: Did the patient need help planning regular tasks such as shopping or remembering to take medications? Independent  Current Functional Level Cognition  Overall Cognitive Status: Within Functional Limits for tasks assessed Orientation Level: Oriented X4 General Comments: decreased insight into deficits until stair training today    Extremity Assessment (includes Sensation/Coordination)  Upper Extremity Assessment: RUE deficits/detail, LUE deficits/detail RUE Deficits / Details: Patient has decreased sensation and is having difficulty performing [place] activies RUE Sensation: decreased light touch RUE Coordination: decreased fine motor LUE Deficits / Details: Patient has decreased sensation and is having difficulty performing [place] activies LUE Sensation: decreased light touch LUE Coordination: decreased fine motor  Lower Extremity Assessment:  Defer to PT evaluation RLE Deficits / Details: 4/5 strength, paresthesia distally RLE Sensation: decreased proprioception RLE Coordination: decreased fine motor LLE Deficits / Details: 4/5 strength, paresthesia distally LLE Sensation: decreased proprioception LLE Coordination: decreased fine motor    ADLs  Overall ADL's : Needs assistance/impaired Eating/Feeding: Set up Grooming: Moderate assistance, Standing Grooming Details (indicate cue type and reason): Leaning into sink for external support. Difficulty with [place] tasks Upper Body Bathing: Minimal assistance, Sitting Lower Body Bathing: Minimal assistance, Sit to/from stand Upper Body Dressing : Minimal assistance, Sitting Lower Body Dressing: Min guard, Sit to/from stand Toilet Transfer: Min guard, Ambulation Toileting- Clothing Manipulation and Hygiene: Minimal assistance Functional mobility during ADLs: Minimal assistance, Rolling walker General ADL Comments: Pt completed functional mobility, grooming tasks at sink.     Mobility  Overal bed mobility: Needs Assistance Bed  Mobility: Sit to Supine Supine to sit: Supervision Sit to supine: Supervision General bed mobility comments: up in chair    Transfers  Overall transfer level: Needs assistance Equipment used: Rolling walker (2 wheeled) Transfers: Sit to/from Stand Sit to Stand: Min assist, Mod assist Stand pivot transfers: Min assist General transfer comment: Cues for technique with rw and safety 2/2 decreased sensation. Steadying assist    Ambulation / Gait / Stairs / Wheelchair Mobility  Ambulation/Gait Ambulation/Gait assistance: Min assist, Mod assist, +2 safety/equipment Gait Distance (Feet): (48ft then 45ft with seated rest break) Assistive device: Rolling walker (2 wheeled) Gait Pattern/deviations: Step-through pattern, Decreased stride length, Narrow base of support, Decreased dorsiflexion - right, Decreased dorsiflexion - left(bilat knee flexion and weakness) General Gait Details: pt requires assistance for balance and with heavy reliance on bilat UE support; increased assistance with distance due to fatigue and increased bilat LE weakness Gait velocity: decreased Gait velocity interpretation: <1.31 ft/sec, indicative of household ambulator Stairs: Yes Stairs assistance: Mod assist Stair Management: One rail Right, Sideways, Step to pattern General stair comments: pt lacks strenght to step up onto step with assistance and use of bilat UE on rail    Posture / Balance Balance Overall balance assessment: Needs assistance Sitting-balance support: No upper extremity supported, Feet supported Sitting balance-Leahy Scale: Good Standing balance support: Bilateral upper extremity supported, During functional activity Standing balance-Leahy Scale: Poor Standing balance comment: external support needed for static standing    Special needs/care consideration BiPAP/CPAP: No CPM: No Continuous Drip IV: No Dialysis: No Life Vest: No Oxygen: No Special Bed: No Trach Size: No Wound Vac (area):  No Skin: WDL, dry                               Bowel mgmt: Continent, last BM 02/08/18 Bladder mgmt: Continent  Diabetic mgmt: No     Previous Home Environment Living Arrangements: Spouse/significant other Available Help at Discharge: Family, Available 24 hours/day Type of Home: House Home Layout: Two level, Bed/bath upstairs Alternate Level Stairs-Rails: Right Alternate Level Stairs-Number of Steps: 13 Home Access: Stairs to enter Entrance Stairs-Rails: Right, Left Entrance Stairs-Number of Steps: 3 Bathroom Shower/Tub: Gaffer, Door [employer] Toilet: Standard Bathroom Accessibility: Yes How Accessible: Accessible via wheelchair Home Care Services: No  Discharge Living Setting Plans for Discharge Living Setting: Patient's home, Lives with (comment)(Spouse) Type of Home at Discharge: House Discharge Home Layout: Two level, Other (Comment)(bathroom on main level ) Alternate Level Stairs-Rails: Right Alternate Level Stairs-Number of Steps: 13 Discharge Home Access: Stairs to enter Entrance Stairs-Rails: Right, Left Entrance Stairs-Number of Steps: 3  Discharge Bathroom Shower/Tub: Walk-in shower, Door Discharge Bathroom Toilet: Standard Discharge Bathroom Accessibility: Yes How Accessible: Accessible via walker(and likely wheelchair if needed) Does the patient have any problems obtaining your medications?: No  Social/Family/Support Systems Patient Roles: Spouse, Parent, Other (Comment)(grandparent ) Contact Information: Spouse: Joaquim Lai  Anticipated Caregiver: Spouse Anticipated Caregiver's Contact Information: [phone] Ability/Limitations of Caregiver: None Caregiver Availability: 24/7 Discharge Plan Discussed with Primary Caregiver: (Discussed with patient; goals Mod I-Supervision ) Is Caregiver In Agreement with Plan?: (Patient in agreement with plan) Does Caregiver/Family have Issues with Lodging/Transportation while Pt is in Rehab?:  No  Goals/Additional Needs Patient/Family Goal for Rehab: PT/OT: Mod I-Supervision  Expected length of stay: 12-15 days  Cultural Considerations: None Dietary Needs: Heart Healthy diet restrictions  Equipment Needs: TBD Special Service Needs: None Pt/Family Agrees to Admission and willing to participate: Yes Program Orientation Provided & Reviewed with Pt/Caregiver Including Roles  & Responsibilities: Yes  Barriers to Discharge: Home environment access/layout(needs to be able to do stairs to get to master bed and bath )  Decrease burden of Care through IP rehab admission: No  Possible need for SNF placement upon discharge: No  Patient Condition: This patient's condition remains as documented in the consult dated 02/07/18 at 0936, in which the Rehabilitation Physician determined and documented that the patient's condition is appropriate for intensive rehabilitative care in an inpatient rehabilitation facility. Will admit to inpatient rehab today.  Preadmission Screen Completed By:  Gunnar Fusi, 02/09/2018 10:51 AM ______________________________________________________________________   Discussed status with Dr. Letta Pate on 02/09/18 at 38 and received telephone approval for admission today.  Admission Coordinator:  Gunnar Fusi, time 1550/Date 03/11/18

## 2018-02-08 NOTE — Progress Notes (Addendum)
Henry Morrison  JKK:[id number] DOB: 1945-11-25 DOA: 02/03/2018 PCP: Leanna Battles, MD    Brief Narrative:  72 y.o.malewith a hx of coronary artery disease, skin cancer, hyperlipidemia, hypertension and degenerative joint disease who presented to the ED w/ lower extremity weakness and paresthesia. Patient had a flu shot a week prior to admission and 4 days later began experiencing paresthesias in his lower extremities. When the discomfort extended into his fingertip he was seen in the ED, but sent home. 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   Subjective: Complaining of lower back pain, paresthesias in upper extremity remains the same, weakness and paresthesia lower extremity has improved .  Assessment & Plan:  Ascending paralysis - GBS continue IVIG as per Neurology -she will complete a total of 5 cycles on 02/07/2018, has been seen by PT OT, recommendation for CIR, awaiting insurance approval .  New low back pain -This is most likely related to lumbar radiculopathy as evidence of bulge with facet hypertrophy on L4/5, and severe bilateral lateral recess narrowing, on L4, and severe bilateral L5 foraminal narrowing as well, no acute findings at the lumbar spine, with no complications status post recent LP.   CAD Asymptomatic   HLD Continue statin and Zetia  HTN BP uncontrolled - likely related to worsening LBP - tx pain - titrate BP meds   Hyperglycemia A1c 6.2  DVT prophylaxis: SQ heparin  Code Status: FULL CODE Family Communication: no family present at time of exam today  Disposition Plan: Awaiting insurance approval for CIR   Consultants:  Neurology   Antimicrobials:  none   Objective: Blood pressure (!) 174/97, pulse 73, temperature 98.9 F (37.2 C), temperature source Oral, resp. rate 18, height 5\' 11"  (1.803 m), weight 90 kg, SpO2 96 %.  Intake/Output  Summary (Last 24 hours) at 02/08/2018 1623 Last data filed at 02/08/2018 1000 Gross per 24 hour  Intake 262.16 ml  Output 2150 ml  Net -1887.84 ml   Filed Weights   02/03/18 1616  Weight: 90 kg    Examination:  Awake Alert, Oriented X 3, No new F.N deficits, Normal affect Symmetrical Chest wall movement, Good air movement bilaterally, CTAB RRR,No Gallops,Rubs or new Murmurs, No Parasternal Heave +ve B.Sounds, Abd Soft, No tenderness, No rebound - guarding or rigidity. No Cyanosis, Clubbing or edema, No new Rash or bruise     CBC: Recent Labs  Lab 02/03/18 1041 02/07/18 0502  WBC 7.7 5.9  HGB 14.2 13.4  HCT 44.3 41.7  MCV 92.9 92.7  PLT 273 716   Basic Metabolic Panel: Recent Labs  Lab 02/03/18 1041 02/07/18 0502  NA 140 133*  K 4.5 4.7  CL 105 99  CO2 22 26  GLUCOSE 183* 114*  BUN 22 16  CREATININE 0.92 0.80  CALCIUM 10.4* 9.9  PHOS  --  3.9   GFR: Estimated Creatinine Clearance: 90.2 mL/min (by C-G formula based on SCr of 0.8 mg/dL).  Liver Function Tests: Recent Labs  Lab 02/03/18 1041 02/07/18 0502  AST 36  --   ALT 44  --   ALKPHOS 74  --   BILITOT 0.6  --   PROT 6.9  --   ALBUMIN 4.0 3.3*    Cardiac Enzymes: No results for input(s): CKTOTAL, CKMB, CKMBINDEX, TROPONINI in the last 168 hours.  HbA1C: Hgb A1c MFr Bld  Date/Time Value  Ref Range Status  02/03/2018 10:41 AM 6.2 (H) 4.8 - 5.6 % Final    Comment:    (NOTE) Pre diabetes:          5.7%-6.4% Diabetes:              >6.4% Glycemic control for   <7.0% adults with diabetes   06/11/2015 04:25 AM 5.9 (H) 4.8 - 5.6 % Final    Comment:    (NOTE)         Pre-diabetes: 5.7 - 6.4         Diabetes: >6.4         Glycemic control for adults with diabetes: <7.0      Recent Results (from the past 240 hour(s))  CSF culture     Status: None   Collection Time: 02/03/18 12:41 PM  Result Value Ref Range Status   Specimen Description CSF  Final   Special Requests NONE  Final   Gram Stain    Final    CYTOSPIN SMEAR WBC PRESENT, PREDOMINANTLY MONONUCLEAR NO ORGANISMS SEEN    Culture   Final    NO GROWTH 3 DAYS Performed at [facility], [address], [place], [place] [address]    Report Status 02/06/2018 FINAL  Final  MRSA PCR Screening     Status: None   Collection Time: 02/03/18  4:24 PM  Result Value Ref Range Status   MRSA by PCR NEGATIVE NEGATIVE Final    Comment:        The GeneXpert MRSA Assay (FDA approved for NASAL specimens only), is one component of a comprehensive MRSA colonization surveillance program. It is not intended to diagnose MRSA infection nor to guide or monitor treatment for MRSA infections. Performed at [facility], [place] [address] [place], [place] [address]      Scheduled Meds: . amLODipine  5 mg Oral Daily  . atorvastatin  80 mg Oral Daily  . ezetimibe  10 mg Oral Daily  . heparin  5,000 Units Subcutaneous Q8H  . isosorbide mononitrate  30 mg Oral Daily  . metoprolol succinate  25 mg Oral QHS  . multivitamin with minerals  1 tablet Oral Daily  . polyethylene glycol  17 g Oral BID  . ramipril  10 mg Oral Daily     LOS: 5 days   Phillips Climes, MD [facility] Office  [phone] Pager - Text Page per Shea Evans  If 7PM-7AM, please contact night-coverage per Amion 02/08/2018, 4:23 PM

## 2018-02-08 NOTE — Progress Notes (Addendum)
Inpatient Rehabilitation  Met with patient at bedside to discuss team's recommendation for IP Rehab.  Shared booklets and answered questions.  Have initiated insurance authorization and plan to follow for timing of medical readiness and insurance authorization.  Call if questions.    Update: If I receive authorization today I have a bed to offer patient.  Plan to update team when I have their decision.  Updates nurse case manager, Levada Dy.  Continue to call if questions.    Carmelia Roller., CCC/SLP Admission Coordinator  [place]  Cell [phone]

## 2018-02-08 NOTE — Progress Notes (Signed)
Inpatient Rehabilitation  No decision from [facility] Medicare today.  Plan to follow up again tomorrow.  Call If questions.   Carmelia Roller., CCC/SLP Admission Coordinator  [place]  Cell [phone]

## 2018-02-08 NOTE — Progress Notes (Signed)
Occupational Therapy Treatment Patient Details Name: Henry Morrison MRN: [id number] DOB: 19-Sep-1945 Today's Date: 02/08/2018    History of present illness 72 y.o. male with a past medical history significant for coronary artery disease, history of a skin cancer, hyperlipidemia, hypertension and degenerative joint disease; who presented to the emergency department secondary to lower extremity weakness and paresthesia.  Patient reported having his flu shot a week prior to admission. History and exam consistent with Guillain-Barr.    OT comments  Pt progressing towards acute OT goals. Very motivated to work with therapy. Focus of session was [facility] activities/exercises, functional mobility, and grooming tasks standing at sink. Pt continues to struggle with balance and assist level with functional mobility and transfers, decreased sensation and [place]. Updating d/c recommendation to CIR for intensive rehab.   Follow Up Recommendations  CIR    Equipment Recommendations  None recommended by OT    Recommendations for Other Services      Precautions / Restrictions Precautions Precautions: Fall Restrictions Weight Bearing Restrictions: No       Mobility Bed Mobility               General bed mobility comments: up in chair  Transfers Overall transfer level: Needs assistance Equipment used: Rolling walker (2 wheeled) Transfers: Sit to/from Stand Sit to Stand: Min assist;Mod assist         General transfer comment: Cues for technique with rw and safety 2/2 decreased sensation. Steadying assist    Balance Overall balance assessment: Needs assistance Sitting-balance support: No upper extremity supported;Feet supported Sitting balance-Leahy Scale: Good     Standing balance support: Bilateral upper extremity supported;During functional activity Standing balance-Leahy Scale: Poor Standing balance comment: external support needed for static standing                           ADL either performed or assessed with clinical judgement   ADL Overall ADL's : Needs assistance/impaired     Grooming: Moderate assistance;Standing Grooming Details (indicate cue type and reason): Leaning into sink for external support. Difficulty with [facility] tasks                 Toilet Transfer: Min guard;Ambulation           Functional mobility during ADLs: Minimal assistance;Rolling walker General ADL Comments: Pt completed functional mobility, grooming tasks at sink.      Vision       Perception     Praxis      Cognition Arousal/Alertness: Awake/alert Behavior During Therapy: WFL for tasks assessed/performed Overall Cognitive Status: Within Functional Limits for tasks assessed                                          Exercises Exercises: Other exercises Other Exercises Other Exercises: Pt completed tasks manipulating fasteners. Issused theraputty and pt completing Mono exercises. Instructed in use.    Shoulder Instructions       General Comments      Pertinent Vitals/ Pain       Pain Assessment: Faces Faces Pain Scale: Hurts little more Pain Location: back Pain Descriptors / Indicators: Aching Pain Intervention(s): Monitored during session;Repositioned;Heat applied  Home Living  Prior Functioning/Environment              Frequency  Min 3X/week        Progress Toward Goals  OT Goals(current goals can now be found in the care plan section)  Progress towards OT goals: Progressing toward goals  Acute Rehab OT Goals Patient Stated Goal: TO GET BACK TO NORMAL OT Goal Formulation: With patient Time For Goal Achievement: 02/19/18 Potential to Achieve Goals: Good  Plan Discharge plan needs to be updated;Frequency needs to be updated    Co-evaluation                 AM-PAC PT "6 Clicks" Daily Activity     Outcome Measure   Help from another person  eating meals?: A Little Help from another person taking care of personal grooming?: A Little Help from another person toileting, which includes using toliet, bedpan, or urinal?: A Little Help from another person bathing (including washing, rinsing, drying)?: A Little Help from another person to put on and taking off regular upper body clothing?: A Little Help from another person to put on and taking off regular lower body clothing?: A Little 6 Click Score: 18    End of Session Equipment Utilized During Treatment: Gait belt;Rolling walker  OT Visit Diagnosis: Unsteadiness on feet (R26.81)   Activity Tolerance Patient tolerated treatment well   Patient Left in chair;with call bell/phone within reach   Nurse Communication          Time: 7353-2992 OT Time Calculation (min): 26 min  Charges: OT General Charges $OT Visit: 1 Visit OT Treatments $Self Care/Home Management : 8-22 mins $Therapeutic Exercise: 8-22 mins  Tyrone Schimke, OT Acute Rehabilitation Services Pager: [phone] Office: [phone]    Hortencia Pilar 02/08/2018, 11:30 AM

## 2018-02-08 NOTE — Care Management Note (Signed)
Case Management Note  Patient Details  Name: Henry Morrison MRN: [id number] Date of Birth: 1945-11-29  Subjective/Objective:   Presented with LE weakness. Pt had recently received flu shot a week ago prior to admit.   Hx of coronary artery disease, skin cancer, hyperlipidemia, hypertension and degenerative joint disease. PTA independent with ADL's, no DME usage. Resides with wife, Henry Morrison.           Payten Beaumier (Spouse)     [phone]       PCP: Leanna Battles  Action/Plan: Transition to CIR,  Insurance authorization pending.  Expected Discharge Date:                  Expected Discharge Plan:  [place]  In-House Referral:  Clinical Social Work  Discharge planning Services  CM Consult   Status of Service:  In process, will continue to follow  If discussed at Long Length of Stay Meetings, dates discussed:    Additional Comments:  Sharin Mons, RN 02/08/2018, 1:50 PM

## 2018-02-09 ENCOUNTER — Other Ambulatory Visit: Payer: Self-pay

## 2018-02-09 ENCOUNTER — Inpatient Hospital Stay (HOSPITAL_COMMUNITY)
Admission: RE | Admit: 2018-02-09 | Discharge: 2018-02-15 | DRG: 052 | Disposition: A | Discharge status: Home / Self Care | Payer: Medicare Other | Source: Intra-hospital | Attending: Physical Medicine & Rehabilitation | Admitting: Physical Medicine & Rehabilitation

## 2018-02-09 ENCOUNTER — Encounter (HOSPITAL_COMMUNITY): Payer: Self-pay

## 2018-02-09 ENCOUNTER — Ambulatory Visit (HOSPITAL_COMMUNITY): Payer: Medicare Other

## 2018-02-09 DIAGNOSIS — M7989 Other specified soft tissue disorders: Secondary | ICD-10-CM

## 2018-02-09 DIAGNOSIS — R4189 Other symptoms and signs involving cognitive functions and awareness: Secondary | ICD-10-CM | POA: Diagnosis present

## 2018-02-09 DIAGNOSIS — Z96651 Presence of right artificial knee joint: Secondary | ICD-10-CM | POA: Diagnosis present

## 2018-02-09 DIAGNOSIS — Z7951 Long term (current) use of inhaled steroids: Secondary | ICD-10-CM | POA: Diagnosis not present

## 2018-02-09 DIAGNOSIS — Z79899 Other long term (current) drug therapy: Secondary | ICD-10-CM | POA: Diagnosis not present

## 2018-02-09 DIAGNOSIS — K5900 Constipation, unspecified: Secondary | ICD-10-CM | POA: Diagnosis present

## 2018-02-09 DIAGNOSIS — G8220 Paraplegia, unspecified: Secondary | ICD-10-CM | POA: Diagnosis present

## 2018-02-09 DIAGNOSIS — E785 Hyperlipidemia, unspecified: Secondary | ICD-10-CM | POA: Diagnosis present

## 2018-02-09 DIAGNOSIS — E871 Hypo-osmolality and hyponatremia: Secondary | ICD-10-CM | POA: Diagnosis not present

## 2018-02-09 DIAGNOSIS — Z85828 Personal history of other malignant neoplasm of skin: Secondary | ICD-10-CM | POA: Diagnosis not present

## 2018-02-09 DIAGNOSIS — N289 Disorder of kidney and ureter, unspecified: Secondary | ICD-10-CM | POA: Diagnosis present

## 2018-02-09 DIAGNOSIS — E875 Hyperkalemia: Secondary | ICD-10-CM | POA: Diagnosis present

## 2018-02-09 DIAGNOSIS — Z955 Presence of coronary angioplasty implant and graft: Secondary | ICD-10-CM | POA: Diagnosis not present

## 2018-02-09 DIAGNOSIS — I2510 Atherosclerotic heart disease of native coronary artery without angina pectoris: Secondary | ICD-10-CM | POA: Diagnosis present

## 2018-02-09 DIAGNOSIS — G610 Guillain-Barre syndrome: Secondary | ICD-10-CM | POA: Diagnosis present

## 2018-02-09 DIAGNOSIS — R7989 Other specified abnormal findings of blood chemistry: Secondary | ICD-10-CM | POA: Diagnosis not present

## 2018-02-09 DIAGNOSIS — I10 Essential (primary) hypertension: Secondary | ICD-10-CM | POA: Diagnosis present

## 2018-02-09 LAB — CBC
HCT: 42.8 % (ref 39.0–52.0)
Hemoglobin: 14.1 g/dL (ref 13.0–17.0)
MCH: 30.1 pg (ref 26.0–34.0)
MCHC: 32.9 g/dL (ref 30.0–36.0)
MCV: 91.5 fL (ref 78.0–100.0)
PLATELETS: 298 10*3/uL (ref 150–400)
RBC: 4.68 MIL/uL (ref 4.22–5.81)
RDW: 14.2 % (ref 11.5–15.5)
WBC: 8.4 10*3/uL (ref 4.0–10.5)

## 2018-02-09 LAB — CREATININE, SERUM
CREATININE: 1.55 mg/dL — AB (ref 0.61–1.24)
GFR calc Af Amer: 50 mL/min — ABNORMAL LOW (ref >60)
GFR calc non Af Amer: 43 mL/min — ABNORMAL LOW (ref >60)

## 2018-02-09 MED ORDER — FLUTICASONE PROPIONATE 50 MCG/ACT NA SUSP
1.0000 | Freq: Every day | NASAL | Status: DC | PRN
  Administered 2018-02-13 – 2018-02-15 (×2): 1 via NASAL
  Filled 2018-02-09: qty 16

## 2018-02-09 MED ORDER — OXYCODONE HCL 5 MG PO TABS: 5.0000 mg | ORAL_TABLET | ORAL | Status: DC | PRN

## 2018-02-09 MED ORDER — ISOSORBIDE MONONITRATE ER 30 MG PO TB24
30.0000 mg | ORAL_TABLET | Freq: Every day | ORAL | Status: DC
  Administered 2018-02-10 – 2018-02-15 (×6): 30 mg via ORAL
  Filled 2018-02-09 (×6): qty 1

## 2018-02-09 MED ORDER — TRAMADOL HCL 50 MG PO TABS: 50.0000 mg | ORAL_TABLET | Freq: Three times a day (TID) | ORAL | Status: DC | PRN

## 2018-02-09 MED ORDER — ADULT MULTIVITAMIN W/MINERALS CH
1.0000 | ORAL_TABLET | Freq: Every day | ORAL | Status: DC
  Administered 2018-02-10 – 2018-02-15 (×6): 1 via ORAL
  Filled 2018-02-09 (×6): qty 1

## 2018-02-09 MED ORDER — ATORVASTATIN CALCIUM 80 MG PO TABS
80.0000 mg | ORAL_TABLET | Freq: Every day | ORAL | Status: DC
  Administered 2018-02-10 – 2018-02-15 (×6): 80 mg via ORAL
  Filled 2018-02-09 (×6): qty 1

## 2018-02-09 MED ORDER — ACETAMINOPHEN 325 MG PO TABS: 650.0000 mg | ORAL_TABLET | Freq: Four times a day (QID) | ORAL | Status: DC | PRN

## 2018-02-09 MED ORDER — AMLODIPINE BESYLATE 5 MG PO TABS: 5.0000 mg | ORAL_TABLET | Freq: Every day | ORAL | Status: DC

## 2018-02-09 MED ORDER — METOPROLOL SUCCINATE ER 25 MG PO TB24
25.0000 mg | ORAL_TABLET | Freq: Every day | ORAL | Status: DC
  Administered 2018-02-09 – 2018-02-14 (×6): 25 mg via ORAL
  Filled 2018-02-09 (×6): qty 1

## 2018-02-09 MED ORDER — ACETAMINOPHEN 325 MG PO TABS
650.0000 mg | ORAL_TABLET | Freq: Four times a day (QID) | ORAL | Status: DC | PRN
  Administered 2018-02-11 – 2018-02-14 (×5): 650 mg via ORAL
  Filled 2018-02-09 (×5): qty 2

## 2018-02-09 MED ORDER — SORBITOL 70 % SOLN: 30.0000 mL | Freq: Every day | Status: DC | PRN

## 2018-02-09 MED ORDER — EZETIMIBE 10 MG PO TABS
10.0000 mg | ORAL_TABLET | Freq: Every day | ORAL | Status: DC
  Administered 2018-02-10 – 2018-02-15 (×6): 10 mg via ORAL
  Filled 2018-02-09 (×6): qty 1

## 2018-02-09 MED ORDER — HEPARIN SODIUM (PORCINE) 5000 UNIT/ML IJ SOLN
5000.0000 [IU] | Freq: Three times a day (TID) | INTRAMUSCULAR | Status: DC
  Administered 2018-02-09 – 2018-02-10 (×2): 5000 [IU] via SUBCUTANEOUS
  Filled 2018-02-09 (×2): qty 1

## 2018-02-09 MED ORDER — RAMIPRIL 2.5 MG PO CAPS
10.0000 mg | ORAL_CAPSULE | Freq: Every day | ORAL | Status: DC
  Administered 2018-02-10: 10 mg via ORAL
  Filled 2018-02-09: qty 4

## 2018-02-09 MED ORDER — HEPARIN SODIUM (PORCINE) 5000 UNIT/ML IJ SOLN: 5000.0000 [IU] | Freq: Three times a day (TID) | INTRAMUSCULAR | Status: DC

## 2018-02-09 MED ORDER — DICLOFENAC SODIUM 1 % TD GEL
2.0000 g | Freq: Four times a day (QID) | TRANSDERMAL | Status: DC | PRN
  Filled 2018-02-09: qty 100

## 2018-02-09 MED ORDER — AMLODIPINE BESYLATE 5 MG PO TABS
5.0000 mg | ORAL_TABLET | Freq: Every day | ORAL | Status: DC
Start: 2018-02-10 — End: 2018-02-12
  Administered 2018-02-10 – 2018-02-11 (×2): 5 mg via ORAL
  Filled 2018-02-09 (×3): qty 1

## 2018-02-09 NOTE — H&P (Signed)
Physical Medicine and Rehabilitation Admission H&P        Chief Complaint  Patient presents with  . Weakness  : HPI: Henry Morrison is a 72 year old right-handed male with history of CAD with stenting, skin cancer, hyperlipidemia and hypertension.  Per chart review and patient, patient lives with spouse.  Independent and active prior to admission.  2 level home with bed and bath upstairs.  3 steps to entry of home.  Good support of wife and extended family.  Presented 02/03/2018 with lower extremity weakness and paresthesia.  Patient had a flu shot approximately 1 week prior to admission and 4 days later began experiencing paresthesias in the lower extremities.  Cranial CT scan unremarkable for acute process.  CK elevated 604.  Lumbar puncture completed showing protein of 58 and a cell count of 2.  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  Subcutaneous heparin for DVT prophylaxis.  Tolerating a regular diet.  Therapy evaluations completed with recommendations of physical medicine rehab consult.  Patient was fitted for a comprehensive rehab program   Review of Systems  Constitutional: Negative for chills and fever.  HENT: Negative for hearing loss.   Eyes: Negative for blurred vision and double vision.  Respiratory: Negative for cough and shortness of breath.   Cardiovascular: Negative for chest pain, palpitations and leg swelling.  Gastrointestinal: Positive for constipation. Negative for nausea and vomiting.  Genitourinary: Negative for dysuria, flank pain and hematuria.  Musculoskeletal: Positive for myalgias.  Skin: Negative for rash.  Neurological: Positive for tingling, sensory change and focal weakness.  All other systems reviewed and are negative.       Past Medical History:  Diagnosis Date  . Arthritis    . CAD (coronary artery disease)    . Cancer ([place])      skin cancer   . Hyperlipidemia    . Primary localized osteoarthritis of right knee  06/09/2015         Past Surgical History:  Procedure Laterality Date  . COLONOSCOPY      . CORONARY ANGIOPLASTY WITH STENT PLACEMENT   10/2000    stenting of the CX vessel  . KNEE ARTHROSCOPY W/ MENISCECTOMY Left 07/15/2006  . KNEE ARTHROSCOPY W/ MENISCECTOMY Right 02/09/2012  . removal of skin cancer        right arm  . TOTAL KNEE ARTHROPLASTY Right 06/09/2015    Procedure: RIGHT TOTAL KNEE ARTHROPLASTY;  Surgeon: Elsie Saas, MD;  Location: [facility];  Service: Orthopedics;  Laterality: Right;  . US ECHOCARDIOGRAPHY   03/07/2012    Trace AI,mild MR,mild to mod TR         Family History  Problem Relation Age of Onset  . Heart disease Mother    . Cancer Father          kidney    Social History:  reports that he has never smoked. He has never used smokeless tobacco. He reports that he drinks about 10.0 standard drinks of alcohol per week. He reports that he does not use drugs. Allergies:       Allergies  Allergen Reactions  . Influenza Vaccines        Guillain-Barr  . Other        Seasonal allergies          Medications Prior to Admission  Medication Sig Dispense Refill  . acetaminophen (TYLENOL) 500 MG tablet Take 1,000 mg by mouth daily as needed (for pain).       Marland Kitchen  atorvastatin (LIPITOR) 80 MG tablet take 1 tablet by mouth once daily 30 tablet 11  . Evolocumab (REPATHA SURECLICK) 814 MG/ML SOAJ Inject 140 mg into the skin every 14 (fourteen) days. 2 pen 12  . ezetimibe (ZETIA) 10 MG tablet take 1 tablet by mouth once daily 30 tablet 11  . fluticasone (FLONASE) 50 MCG/ACT nasal spray Place 1 spray into both nostrils as needed for allergies.       . isosorbide mononitrate (IMDUR) 30 MG 24 hr tablet take 1 tablet by mouth once daily 30 tablet 11  . metoprolol succinate (TOPROL-XL) 25 MG 24 hr tablet take 1 tablet by mouth once daily 30 tablet 11  . Multiple Vitamin (MULTIVITAMIN) tablet Take 1 tablet by mouth daily.      . ramipril (ALTACE) 10 MG capsule take 1 capsule by mouth  once daily 30 capsule 11      Drug Regimen Review Drug regimen was reviewed and remains appropriate with no significant issues identified   Home: Home Living Family/patient expects to be discharged to:: Private residence Living Arrangements: Spouse/significant other Available Help at Discharge: Family, Available 24 hours/day Type of Home: House Home Access: Stairs to enter [employer] of Steps: 3 Entrance Stairs-Rails: Right, Left Home Layout: Two level, Bed/bath upstairs Alternate Level Stairs-Number of Steps: 13 Alternate Level Stairs-Rails: Right Bathroom Shower/Tub: Gaffer, Door [employer] Toilet: Standard Bathroom Accessibility: Yes Home Equipment: Environmental consultant - 2 wheels, Cane - single point, Crutches, Hand held shower head, Shower seat   Functional History: Prior Function Level of Independence: Independent Comments: active   Functional Status:  Mobility: Bed Mobility Overal bed mobility: Needs Assistance Bed Mobility: Sit to Supine Supine to sit: Supervision Sit to supine: Supervision General bed mobility comments: supervision for safety Transfers Overall transfer level: Needs assistance Equipment used: Rolling walker (2 wheeled) Transfers: Sit to/from Stand Sit to Stand: Min assist, Mod assist Stand pivot transfers: Min assist General transfer comment: cues for safe hand placement; assist to steady; when returning to seated position pt needed cues for bringing L LE back as he did not realize it was way outside BOS and needed increased assistance for balance Ambulation/Gait Ambulation/Gait assistance: Min assist, Mod assist, +2 safety/equipment Gait Distance (Feet): (20f then 764fwith seated rest break) Assistive device: Rolling walker (2 wheeled) Gait Pattern/deviations: Step-through pattern, Decreased stride length, Narrow base of support, Decreased dorsiflexion - right, Decreased dorsiflexion - left(bilat knee flexion and weakness) General Gait  Details: pt requires assistance for balance and with heavy reliance on bilat UE support; increased assistance with distance due to fatigue and increased bilat LE weakness Gait velocity: decreased Gait velocity interpretation: <1.31 ft/sec, indicative of household ambulator Stairs: Yes Stairs assistance: Mod assist Stair Management: One rail Right, Sideways, Step to pattern General stair comments: pt lacks strenght to step up onto step with assistance and use of bilat UE on rail   ADL: ADL Overall ADL's : Needs assistance/impaired Eating/Feeding: Set up Grooming: Wash/dry hands, Wash/dry face, Moderate assistance Grooming Details (indicate cue type and reason): Patient needs assist with opening containers and shaving Upper Body Bathing: Minimal assistance, Sitting Lower Body Bathing: Minimal assistance, Sit to/from stand Upper Body Dressing : Minimal assistance, Sitting Lower Body Dressing: Min guard, Sit to/from stand Toilet Transfer: Min guard, Ambulation Toileting- Clothing Manipulation and Hygiene: Minimal assistance Functional mobility during ADLs: Minimal assistance, Rolling walker General ADL Comments: PATIENT WAS    Cognition: Cognition Overall Cognitive Status: Within Functional Limits for tasks assessed Orientation Level: Oriented X4  Cognition Arousal/Alertness: Awake/alert Behavior During Therapy: WFL for tasks assessed/performed Overall Cognitive Status: Within Functional Limits for tasks assessed General Comments: decreased insight into deficits until stair training today   Physical Exam: Blood pressure (!) 174/97, pulse 73, temperature 98.9 F (37.2 C), temperature source Oral, resp. rate 18, height 5' 11"  (1.803 m), weight 90 kg, SpO2 96 %. Physical Exam  Vitals reviewed. Constitutional: He is oriented to person, place, and time. He appears well-developed.  HENT:  Head: Normocephalic.  Eyes: EOM are normal. Right eye exhibits no discharge. Left eye exhibits no  discharge.  Neck: Normal range of motion. Neck supple. No thyromegaly present.  Cardiovascular: Normal rate, regular rhythm and normal heart sounds.  Respiratory: Effort normal and breath sounds normal. No respiratory distress.  GI: Soft. Bowel sounds are normal. He exhibits no distension.  Neurological: He is alert and oriented to person, place, and time.  Skin: Skin is warm and dry.   Motor strength is 5/5 bilateral deltoid by stress of grip 5/5 bilateral hip flexor knee extensor and 4-/5 bilateral ankle dorsiflexors and toe extensors. Sensation is intact to pinprick proprioception and light touch in the upper limbs.  Sensation is intact to pinprick in the lower limbs however absent in the toes and below the ankle for light touch and proprioception in both feet   Lab Results Last 48 Hours        Results for orders placed or performed during the hospital encounter of 02/03/18 (from the past 48 hour(s))  CBC     Status: None    Collection Time: 02/07/18  5:02 AM  Result Value Ref Range    WBC 5.9 4.0 - 10.5 K/uL    RBC 4.50 4.22 - 5.81 MIL/uL    Hemoglobin 13.4 13.0 - 17.0 g/dL    HCT 41.7 39.0 - 52.0 %    MCV 92.7 78.0 - 100.0 fL    MCH 29.8 26.0 - 34.0 pg    MCHC 32.1 30.0 - 36.0 g/dL    RDW 13.6 11.5 - 15.5 %    Platelets 238 150 - 400 K/uL      Comment: Performed at [facility], [place] [address] [place], [place] [address]  Renal function panel     Status: Abnormal    Collection Time: 02/07/18  5:02 AM  Result Value Ref Range    Sodium 133 (L) 135 - 145 mmol/L    Potassium 4.7 3.5 - 5.1 mmol/L    Chloride 99 98 - 111 mmol/L    CO2 26 22 - 32 mmol/L    Glucose, Bld 114 (H) 70 - 99 mg/dL    BUN 16 8 - 23 mg/dL    Creatinine, Ser 0.80 0.61 - 1.24 mg/dL    Calcium 9.9 8.9 - 10.3 mg/dL    Phosphorus 3.9 2.5 - 4.6 mg/dL    Albumin 3.3 (L) 3.5 - 5.0 g/dL    GFR calc non Af Amer >60 >60 mL/min    GFR calc Af Amer >60 >60 mL/min      Comment: (NOTE) The eGFR has been  calculated using the CKD EPI equation. This calculation has not been validated in all clinical situations. eGFR's persistently <60 mL/min signify possible Chronic Kidney Disease.      Anion gap 8 5 - 15      Comment: Performed at [facility] [address], [place], [place] [address]       Imaging Results (Last 48 hours)  Ct  Lumbar Spine Wo Contrast   Result Date: 02/07/2018 CLINICAL DATA:  Initial evaluation for acute severe lower back pain status post recent lumbar puncture. EXAM: CT LUMBAR SPINE WITHOUT CONTRAST TECHNIQUE: Multidetector CT imaging of the lumbar spine was performed without intravenous contrast administration. Multiplanar CT image reconstructions were also generated. COMPARISON:  None available. FINDINGS: Segmentation: Normal segmentation. Lowest well-formed disc labeled the L5-S1 level. Alignment: Trace 2 mm retrolisthesis of L1 on L2 and L5 on S1. Alignment otherwise normal with preservation of the normal lumbar lordosis. No listhesis or subluxation. Vertebrae: Vertebral body heights maintained without evidence for acute or chronic fracture. Small chronic endplate Schmorl's nodes noted at the superior endplate of L2 and inferior endplate of L4. No discrete or worrisome osseous lesions. Visualized sacrum and pelvis intact. SI joints approximated and symmetric. Paraspinal and other soft tissues: Paraspinous soft tissues demonstrate no acute abnormality. No hematoma or other complication status post recent lumbar puncture. No epidural collections. Prominent aorto bi-iliac atherosclerotic disease noted. Visualized visceral structures otherwise unremarkable. Disc levels: L1-2: Chronic intervertebral disc space narrowing with diffuse disc bulge and disc desiccation. Reactive endplate changes with marginal endplate osteophytic spurring. Mild facet hypertrophy. Resultant mild spinal stenosis. Mild left L1 foraminal narrowing. L2-3: Mild diffuse disc bulge with intervertebral disc  space narrowing. Mild bilateral facet hypertrophy. No significant spinal stenosis. Mild bilateral L2 foraminal stenosis. L3-4: Diffuse disc bulge with intervertebral disc space narrowing. Mild bilateral facet hypertrophy. Resultant mild bilateral lateral recess stenosis. Mild to moderate bilateral L3 foraminal narrowing. L4-5: Chronic intervertebral disc space narrowing with diffuse disc bulge and disc desiccation. Moderate facet and ligament flavum hypertrophy. Resultant moderate canal with moderate to severe bilateral lateral recess narrowing. Moderate to severe bilateral L4 foraminal narrowing, left greater than right. L5-S1: Chronic intervertebral disc space narrowing with diffuse disc bulge and disc desiccation. Moderate bilateral facet hypertrophy. No significant spinal stenosis. Severe bilateral L5 foraminal narrowing. IMPRESSION: 1. No acute abnormality identified within the lumbar spine. No complication status post recent lumbar puncture. 2. Disc bulge with facet hypertrophy at L4-5 with resultant moderate to severe bilateral lateral recess narrowing with bilateral L4 foraminal stenosis. 3. Severe bilateral L5 foraminal narrowing related to disc osteophyte and facet hypertrophy. 4. Additional more mild non-compressive degenerative disc bulging and facet hypertrophy elsewhere within the lumbar spine as detailed above. Electronically Signed   By: Jeannine Boga M.D.   On: 02/07/2018 22:59             Medical Problem List and Plan: 1.  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  IVIG completed 02/07/2018 2.  DVT Prophylaxis/Anticoagulation: Subcutaneous heparin.  Check vascular study 3. Pain Management: Oxycodone/Ultram as needed 4. Mood: Provide emotional support 5. Neuropsych: This patient is capable of making decisions on his own behalf. 6. Skin/Wound Care: Routine skin checks 7. Fluids/Electrolytes/Nutrition: Routine in and outs with follow-up chemistries 8.  Hypertension.  Imdur 30  mg daily, Toprol-XL 25 mg nightly, Altace 10 mg daily 9.  CAD with history of stenting.  No chest pain or shortness of breath 10.  Hyperlipidemia.  Lipitor/Zetia   Post Admission Physician Evaluation: 1. 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 2. Patient admitted to receive collaborative, interdisciplinary care between the physiatrist, rehab nursing staff, and therapy team. 3. Patient's level of medical complexity and substantial therapy needs in context of that medical necessity cannot be provided at a lesser intensity of care. 4. Patient has experienced substantial functional loss from his/her baseline. Judging by the  patient's diagnosis, physical exam, and functional history, the patient has potential for functional progress which will result in measurable gains while on inpatient rehab.  These gains will be of substantial and practical use upon discharge in facilitating mobility and self-care at the household level. 5. Physiatrist will provide 24 hour management of medical needs as well as oversight of the therapy plan/treatment and provide guidance as appropriate regarding the interaction of the two. 6. 24 hour rehab nursing will assist in the management of  bladder management, bowel management, safety, skin/wound care, disease management, medication administration, pain management and patient education  and help integrate therapy concepts, techniques,education, etc. 7. PT will assess and treat for pre gait, gait training, endurance , safety, equipment, neuromuscular re education:  .  Goals are: independent with assistive device. 8. OT will assess and treat for ADLs, Cognitive perceptual skills, Neuromuscular re education, safety, endurance, equipment  .  Goals are: independent with assistive device.  9. SLP will assess and treat for  .  Goals are: N/A. 10. Case Management and Social Worker will assess and treat for psychological issues  and discharge planning. 11. Team conference will be held weekly to assess progress toward goals and to determine barriers to discharge. 12.  Patient will receive at least 3 hours of therapy per day at least 5 days per week. 13. ELOS and Prognosis: 1wk excellent      "I have personally performed a face to face diagnostic evaluation of this patient.  Additionally, I have reviewed and concur with the physician assistant's documentation above."  Charlett Blake M.D. [facility] FAAPM&R (Sports Med, Neuromuscular Med) Diplomate Am Board of Electrodiagnostic Med  Elizabeth Sauer 02/08/2018

## 2018-02-09 NOTE — Discharge Instructions (Signed)
Further follow-up and management.  CIR  Activity: As tolerated with Full fall precautions use walker/cane & assistance as needed   Disposition CIR   Diet: Heart Healthy  On your next visit with your primary care physician please Get Medicines reviewed and adjusted.   Please request your Prim.MD to go over all Hospital Tests and Procedure/Radiological results at the follow up, please get all Hospital records sent to your Prim MD by signing hospital release before you go home.   If you experience worsening of your admission symptoms, develop shortness of breath, life threatening emergency, suicidal or homicidal thoughts you must seek medical attention immediately by calling 911 or calling your MD immediately  if symptoms less severe.  You Must read complete instructions/literature along with all the possible adverse reactions/side effects for all the Medicines you take and that have been prescribed to you. Take any new Medicines after you have completely understood and accpet all the possible adverse reactions/side effects.   Do not drive, operating heavy machinery, perform activities at heights, swimming or participation in water activities or provide baby sitting services if your were admitted for syncope or siezures until you have seen by Primary MD or a Neurologist and advised to do so again.  Do not drive when taking Pain medications.    Do not take more than prescribed Pain, Sleep and Anxiety Medications  Special Instructions: If you have smoked or chewed Tobacco  in the last 2 yrs please stop smoking, stop any regular Alcohol  and or any Recreational drug use.  Wear Seat belts while driving.   Please note  You were cared for by a hospitalist during your hospital stay. If you have any questions about your discharge medications or the care you received while you were in the hospital after you are discharged, you can call the unit and asked to speak with the hospitalist on call if  the hospitalist that took care of you is not available. Once you are discharged, your primary care physician will handle any further medical issues. Please note that NO REFILLS for any discharge medications will be authorized once you are discharged, as it is imperative that you return to your primary care physician (or establish a relationship with a primary care physician if you do not have one) for your aftercare needs so that they can reassess your need for medications and monitor your lab values.

## 2018-02-09 NOTE — Progress Notes (Signed)
Pt returned back to unit via [place]. Pt transferred to bed. Bed alarm on side rails up x 3, call bell in reach.  Erie Noe, LPN

## 2018-02-09 NOTE — Progress Notes (Signed)
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 Transferred to recliner via stand pivot. No c/o pain noted. Oriented to room. Placed call bell within reach.   Erie Noe, LPN

## 2018-02-09 NOTE — Progress Notes (Signed)
Bilateral lower extremity venous duplex completed. Preliminary results - There is noe evidence of a DVT or Baker's cyst. Toma Copier, RVS 02/09/2018 3:59 PM

## 2018-02-09 NOTE — Progress Notes (Signed)
Inpatient Rehabilitation  I have insurance authorization to admit patient to IP Rehab today, as well we MD approval.  Patient in agreement with plan to proceed with admission today.  Updated team.  Call if questions.   Carmelia Roller., CCC/SLP Admission Coordinator  [place]  Cell [phone]

## 2018-02-09 NOTE — Care Management Note (Signed)
Case Management Note  Patient Details  Name: Henry Morrison MRN: [id number] Date of Birth: 1945-09-19  Subjective/Objective:          Presented with LE weakness. Pt had recently receivedflu shot a week ago prior to admit.   Hx of coronary artery disease, skin cancer, hyperlipidemia, hypertension and degenerative joint disease. PTA independent with ADL's, no DME usage. Resides with wife, Henry Morrison.           Ajax Schroll (Spouse)     [phone]         PCP: Dr. Leanna Battles    Action/Plan: Transition to CIR today.  Expected Discharge Date:  02/09/18               Expected Discharge Plan:  [name]  In-House Referral:  Clinical Social Work  Discharge planning Services  CM Consult  Status of Service:  Completed, signed off  If discussed at [employer], dates discussed:    Additional Comments:  Sharin Mons, RN 02/09/2018, 11:19 AM

## 2018-02-09 NOTE — Progress Notes (Signed)
Pt being transported via WC to vascular for study r/o DVT.  Erie Noe, LPN 0:14 PM

## 2018-02-09 NOTE — Discharge Summary (Signed)
Henry Morrison, is a 72 y.o. male  DOB [birth year]  MRN [id number].  Admission date:  02/03/2018  Admitting Physician  Barton Dubois, MD  Discharge Date:  02/09/2018   Primary MD  Leanna Battles, MD  Recommendations for primary care physician for things to follow:  -Patient will need to follow with neurology as an outpatient   Admission Diagnosis  Muscle Weakness   Discharge Diagnosis  Muscle Weakness    Principal Problem:   Ascending paralysis ([place]) Active Problems:   CAD (coronary artery disease)   Familial hyperlipidemia, high LDL   Essential hypertension   DJD (degenerative joint disease) of knee   Hyperglycemia   Guillain Barr syndrome (H[facility])   Dyslipidemia   Benign essential HTN   Prediabetes   Hyponatremia      Past Medical History:  Diagnosis Date  . Arthritis   . CAD (coronary artery disease)   . Cancer ([place])    skin cancer   . Hyperlipidemia   . Primary localized osteoarthritis of right knee 06/09/2015    Past Surgical History:  Procedure Laterality Date  . COLONOSCOPY    . CORONARY ANGIOPLASTY WITH STENT PLACEMENT  10/2000   stenting of the CX vessel  . KNEE ARTHROSCOPY W/ MENISCECTOMY Left 07/15/2006  . KNEE ARTHROSCOPY W/ MENISCECTOMY Right 02/09/2012  . removal of skin cancer     right arm  . TOTAL KNEE ARTHROPLASTY Right 06/09/2015   Procedure: RIGHT TOTAL KNEE ARTHROPLASTY;  Surgeon: Elsie Saas, MD;  Location: [facility];  Service: Orthopedics;  Laterality: Right;  . US ECHOCARDIOGRAPHY  03/07/2012   Trace AI,mild MR,mild to mod TR       History of present illness and  Hospital Course:     Kindly see H&P for history of present illness and admission details, please review complete Labs, Consult reports and Test reports for all details in brief  HPI  from the history and physical done on the day of admission 02/03/2018   HPI: Henry Morrison is a 72 y.o. male  with a past medical history significant for coronary artery disease, history of a skin cancer, hyperlipidemia, hypertension and degenerative joint disease; who presented to the emergency department secondary to lower extremity weakness and paresthesia.  Patient reported having his flu shot a week prior to admission and apparently 4 days after flu shot he was experiencing some paresthesia in his lower extremities, the discomfort extended into the fingertip of his hands and he was seen in the emergency department for that.  At that time he received fluid resuscitation no acute abnormalities were found and they attributed his symptoms to mild dehydration and prolonged heat exposure.  Despite some improvement the next 8 symptoms are started to come back and on the day of admission he was essentially having difficulty walking and even had a mild episode of falling in the bathroom.  He did not injure himself from the fall. Patient was evaluated by PCP who got concern for GBS and send the patient back to the  emergency department for further evaluation and management. Patient denies chest pain, fever, chills, nausea, vomiting, abdominal pain, diarrhea, hematuria, dysuria, melena, hematochezia, other focal weakness, headaches or difficulty with his vision.  In the ED physical examination demonstrated weakness, decreased sensation and affected reflexes in his lower extremities.  Neurology was consulted and based on his presentation confirmed concerns for GBS.  TRH has been called to place patient in the hospital for further evaluation and management.  IVIG has been ordered by neurology.   Hospital Course  72 y.o.malewith a hx of coronary artery disease, skin cancer, hyperlipidemia, hypertension and degenerative joint disease who presented to the ED w/ lower extremity weakness and paresthesia. Patient had a flu shot a week prior to admission and 4 days later began experiencing paresthesias in his lower  extremities. When the discomfort extended into his fingertip he was seen in the ED, but sent home. When his sx worsened to the point of having difficulty walking he went to his PCP, who sent him back to the ED for evaluation of suspected GBS.,  He was treated with total 5 days of IVIG during hospital stay   Ascending paralysis - GBS -He was seen by neurology, had an LP which did show protein of 58, and cell count of 2, was treated with total of 5 days of IVIG, he was seen by PT with recommendation for CIR,.  New low back pain -This is most likely related to lumbar radiculopathy as evidence of bulge with facet hypertrophy on L4/5, and severe bilateral lateral recess narrowing, on L4, and severe bilateral L5 foraminal narrowing as well, no acute findings at the lumbar spine, with no complications status post recent LP. -She did report significant improvement of symptoms after using heat pad   CAD Asymptomatic   HLD Continue with home meds  HTN BP uncontrolled controlled on home regimen, so I have added amlodipine with better control, so it was added on his discharge medication.  Hyperglycemia A1c 6.2   Discharge Condition:  Stable        Discharge Instructions  and  Discharge Medications    Discharge Instructions    Ambulatory referral to Neurology   Complete by:  As directed    An appointment is requested in approximately: 4 weeks   Discharge instructions   Complete by:  As directed    Further follow-up and management.  CIR  Activity: As tolerated with Full fall precautions use walker/cane & assistance as needed   Disposition CIR   Diet: Heart Healthy  On your next visit with your primary care physician please Get Medicines reviewed and adjusted.   Please request your Prim.MD to go over all Hospital Tests and Procedure/Radiological results at the follow up, please get all Hospital records sent to your Prim MD by signing hospital release before you go  home.   If you experience worsening of your admission symptoms, develop shortness of breath, life threatening emergency, suicidal or homicidal thoughts you must seek medical attention immediately by calling 911 or calling your MD immediately  if symptoms less severe.  You Must read complete instructions/literature along with all the possible adverse reactions/side effects for all the Medicines you take and that have been prescribed to you. Take any new Medicines after you have completely understood and accpet all the possible adverse reactions/side effects.   Do not drive, operating heavy machinery, perform activities at heights, swimming or participation in water activities or provide baby sitting services if your were admitted for syncope  or siezures until you have seen by Primary MD or a Neurologist and advised to do so again.  Do not drive when taking Pain medications.    Do not take more than prescribed Pain, Sleep and Anxiety Medications  Special Instructions: If you have smoked or chewed Tobacco  in the last 2 yrs please stop smoking, stop any regular Alcohol  and or any Recreational drug use.  Wear Seat belts while driving.   Please note  You were cared for by a hospitalist during your hospital stay. If you have any questions about your discharge medications or the care you received while you were in the hospital after you are discharged, you can call the unit and asked to speak with the hospitalist on call if the hospitalist that took care of you is not available. Once you are discharged, your primary care physician will handle any further medical issues. Please note that NO REFILLS for any discharge medications will be authorized once you are discharged, as it is imperative that you return to your primary care physician (or establish a relationship with a primary care physician if you do not have one) for your aftercare needs so that they can reassess your need for medications and  monitor your lab values.   Increase activity slowly   Complete by:  As directed      Allergies as of 02/09/2018      Reactions   Influenza Vaccines    Guillain-Barr   Other    Seasonal allergies      Medication List    TAKE these medications   acetaminophen 325 MG tablet Commonly known as:  TYLENOL Take 2 tablets (650 mg total) by mouth every 6 (six) hours as needed for mild pain, fever or headache. What changed:    medication strength  how much to take  when to take this  reasons to take this   amLODipine 5 MG tablet Commonly known as:  NORVASC Take 1 tablet (5 mg total) by mouth daily. Start taking on:  02/10/2018   atorvastatin 80 MG tablet Commonly known as:  LIPITOR take 1 tablet by mouth once daily   Evolocumab 140 MG/ML Soaj Inject 140 mg into the skin every 14 (fourteen) days.   ezetimibe 10 MG tablet Commonly known as:  ZETIA take 1 tablet by mouth once daily   fluticasone 50 MCG/ACT nasal spray Commonly known as:  FLONASE Place 1 spray into both nostrils as needed for allergies.   isosorbide mononitrate 30 MG 24 hr tablet Commonly known as:  IMDUR take 1 tablet by mouth once daily   metoprolol succinate 25 MG 24 hr tablet Commonly known as:  TOPROL-XL take 1 tablet by mouth once daily   multivitamin tablet Take 1 tablet by mouth daily.   ramipril 10 MG capsule Commonly known as:  ALTACE take 1 capsule by mouth once daily         Diet and Activity recommendation: See Discharge Instructions above   Consults obtained -  Urology   Major procedures and Radiology Reports - PLEASE review detailed and final reports for all details, in brief -      Ct Head Wo Contrast  Result Date: 01/31/2018 CLINICAL DATA:  Dizziness/vertigo. Difficulty walking. Symptoms for several days. No reported injury. EXAM: CT HEAD WITHOUT CONTRAST TECHNIQUE: Contiguous axial images were obtained from the base of the skull through the vertex without  intravenous contrast. COMPARISON:  None. FINDINGS: Brain: No evidence of parenchymal hemorrhage or extra-axial  fluid collection. No mass lesion, mass effect, or midline shift. No CT evidence of acute infarction. Cerebral volume is age appropriate. No ventriculomegaly. Vascular: No acute abnormality. Skull: No evidence of calvarial fracture. Sinuses/Orbits: Prominent mucoperiosteal thickening throughout the right maxillary sinus. No definite fluid levels. Other:  The mastoid air cells are unopacified. IMPRESSION: 1.  No evidence of acute intracranial abnormality. 2. Chronic appearing right maxillary sinusitis. Electronically Signed   By: Ilona Sorrel M.D.   On: 01/31/2018 18:33   Ct Lumbar Spine Wo Contrast  Result Date: 02/07/2018 CLINICAL DATA:  Initial evaluation for acute severe lower back pain status post recent lumbar puncture. EXAM: CT LUMBAR SPINE WITHOUT CONTRAST TECHNIQUE: Multidetector CT imaging of the lumbar spine was performed without intravenous contrast administration. Multiplanar CT image reconstructions were also generated. COMPARISON:  None available. FINDINGS: Segmentation: Normal segmentation. Lowest well-formed disc labeled the L5-S1 level. Alignment: Trace 2 mm retrolisthesis of L1 on L2 and L5 on S1. Alignment otherwise normal with preservation of the normal lumbar lordosis. No listhesis or subluxation. Vertebrae: Vertebral body heights maintained without evidence for acute or chronic fracture. Small chronic endplate Schmorl's nodes noted at the superior endplate of L2 and inferior endplate of L4. No discrete or worrisome osseous lesions. Visualized sacrum and pelvis intact. SI joints approximated and symmetric. Paraspinal and other soft tissues: Paraspinous soft tissues demonstrate no acute abnormality. No hematoma or other complication status post recent lumbar puncture. No epidural collections. Prominent aorto bi-iliac atherosclerotic disease noted. Visualized visceral structures  otherwise unremarkable. Disc levels: L1-2: Chronic intervertebral disc space narrowing with diffuse disc bulge and disc desiccation. Reactive endplate changes with marginal endplate osteophytic spurring. Mild facet hypertrophy. Resultant mild spinal stenosis. Mild left L1 foraminal narrowing. L2-3: Mild diffuse disc bulge with intervertebral disc space narrowing. Mild bilateral facet hypertrophy. No significant spinal stenosis. Mild bilateral L2 foraminal stenosis. L3-4: Diffuse disc bulge with intervertebral disc space narrowing. Mild bilateral facet hypertrophy. Resultant mild bilateral lateral recess stenosis. Mild to moderate bilateral L3 foraminal narrowing. L4-5: Chronic intervertebral disc space narrowing with diffuse disc bulge and disc desiccation. Moderate facet and ligament flavum hypertrophy. Resultant moderate canal with moderate to severe bilateral lateral recess narrowing. Moderate to severe bilateral L4 foraminal narrowing, left greater than right. L5-S1: Chronic intervertebral disc space narrowing with diffuse disc bulge and disc desiccation. Moderate bilateral facet hypertrophy. No significant spinal stenosis. Severe bilateral L5 foraminal narrowing. IMPRESSION: 1. No acute abnormality identified within the lumbar spine. No complication status post recent lumbar puncture. 2. Disc bulge with facet hypertrophy at L4-5 with resultant moderate to severe bilateral lateral recess narrowing with bilateral L4 foraminal stenosis. 3. Severe bilateral L5 foraminal narrowing related to disc osteophyte and facet hypertrophy. 4. Additional more mild non-compressive degenerative disc bulging and facet hypertrophy elsewhere within the lumbar spine as detailed above. Electronically Signed   By: Jeannine Boga M.D.   On: 02/07/2018 22:59    Micro Results     Recent Results (from the past 240 hour(s))  CSF culture     Status: None   Collection Time: 02/03/18 12:41 PM  Result Value Ref Range Status    Specimen Description CSF  Final   Special Requests NONE  Final   Gram Stain   Final    CYTOSPIN SMEAR WBC PRESENT, PREDOMINANTLY MONONUCLEAR NO ORGANISMS SEEN    Culture   Final    NO GROWTH 3 DAYS Performed at [facility], [address] [place], [place] [address]    Report  Status 02/06/2018 FINAL  Final  MRSA PCR Screening     Status: None   Collection Time: 02/03/18  4:24 PM  Result Value Ref Range Status   MRSA by PCR NEGATIVE NEGATIVE Final    Comment:        The GeneXpert MRSA Assay (FDA approved for NASAL specimens only), is one component of a comprehensive MRSA colonization surveillance program. It is not intended to diagnose MRSA infection nor to guide or monitor treatment for MRSA infections. Performed at [facility], [place] [address], [place], [place] [address]        Today   Subjective:   Lemar Bakos today denies any headache or any new complaints, reports paresthesia in upper extremity feet remains the same, he denies any worsening weakness or new deficits .  Objective:   Blood pressure 126/84, pulse 79, temperature 97.9 F (36.6 C), temperature source Oral, resp. rate 18, height 5\' 11"  (1.803 m), weight 90 kg, SpO2 96 %.   Intake/Output Summary (Last 24 hours) at 02/09/2018 1041 Last data filed at 02/09/2018 0900 Gross per 24 hour  Intake -  Output 400 ml  Net -400 ml    Exam Awake Alert, Oriented x 3, No new F.N deficits, Normal affect Symmetrical Chest wall movement, Good air movement bilaterally, CTAB RRR,No Gallops,Rubs or new Murmurs, No Parasternal Heave +ve B.Sounds, Abd Soft, Non tender,  No rebound -guarding or rigidity. No Cyanosis, Clubbing or edema, No new Rash or bruise  Data Review   CBC w Diff:  Lab Results  Component Value Date   WBC 5.9 02/07/2018   HGB 13.4 02/07/2018   HCT 41.7 02/07/2018   PLT 238 02/07/2018   LYMPHOPCT 30 06/02/2015   MONOPCT 7 06/02/2015   EOSPCT 3 06/02/2015   BASOPCT 0 06/02/2015     CMP:  Lab Results  Component Value Date   NA 133 (L) 02/07/2018   K 4.7 02/07/2018   CL 99 02/07/2018   CO2 26 02/07/2018   BUN 16 02/07/2018   CREATININE 0.80 02/07/2018   CREATININE 0.90 05/19/2016   PROT 6.9 02/03/2018   PROT 6.7 12/14/2017   ALBUMIN 3.3 (L) 02/07/2018   ALBUMIN 4.5 12/14/2017   BILITOT 0.6 02/03/2018   BILITOT 0.7 12/14/2017   ALKPHOS 74 02/03/2018   AST 36 02/03/2018   ALT 44 02/03/2018  .   Total Time in preparing paper work, data evaluation and todays exam - 36 minutes  Phillips Climes M.D on 02/09/2018 at 10:41 AM  [facility]   Office  [phone]

## 2018-02-09 NOTE — Progress Notes (Signed)
PMR Admission Coordinator Pre-Admission Assessment  Patient: Henry Morrison is an 72 y.o., male MRN: [id number] DOB: 1945/07/02 Height: 5\' 11"  (180.3 cm) Weight: 90 kg                                                                                                                                                  Insurance Information HMO: Yes    PPO:      PCP:      IPA:      80/20:      OTHER: PRIMARY: [employer] Medicare      Policy#: [id number]      Subscriber: Self CM Name: Vevelyn Royals      Phone#: [phone]     Fax#: [phone] Pre-Cert#: [id number] given 02/09/18 by Anderson Malta (phone:[phone]) for 7 days, 02/09/18-02/15/18 with faxed updates due to Vevelyn Royals   Employer: Retired Benefits:  Phone #: [phone]     Name: Verified online at [email or web address] Eff. Date: 05/17/17     Deduct: $0      Out of Pocket Max: $4400      Life Max: N/A CIR: $345 a day, days 1-5; $0 a day, days 6+      SNF: $0 a day, days 1-20; $160 a day, days 21-48; $0 a day, days 49-100 Outpatient: Necessity      Co-Pay: $40 per visit  Home Health: Necessity       Co-Pay: $0 DME: 80%     Co-Pay: 20% Providers: In-network   SECONDARY: None        Emergency Contact Information         Contact Information    Name Relation Home Work Mobile   Alperin,Frances Spouse [phone]  [phone]     Current Medical History  Patient Admitting Diagnosis: GBS  History of Present Illness: Henry Morrison is a 72 year old right-handed male with history of CAD with stenting, skin cancer, hyperlipidemia and hypertension. Per chart review and patient, patient lives with spouse. Independent and active prior to admission. 2 level home with bed and bath upstairs. 3 steps to entry of home. Good support of wife and extended family. Presented 02/03/2018 with lower extremity weakness and paresthesia. Patient had a flu shot approximately 1 week prior to admission and 4 days later began experiencing paresthesias in the lower extremities.  Cranial CT scan unremarkable for acute process. CK elevated 604. Lumbar puncture completed showing protein of 58 and a cell count of 2. 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 Subcutaneous heparin for DVT prophylaxis. Tolerating a regular diet. Therapy evaluations completed with recommendations of physical medicine rehab consult. Patient was admitted for a comprehensive rehab program 02/09/18.  Past Medical History      Past Medical History:  Diagnosis Date  . Arthritis   . CAD (coronary artery disease)   . Cancer ([place])  skin cancer   . Hyperlipidemia   . Primary localized osteoarthritis of right knee 06/09/2015    Family History  family history includes Cancer in his father; Heart disease in his mother.  Prior Rehab/Hospitalizations:  Has the patient had major surgery during 100 days prior to admission? No  Current Medications   Current Facility-Administered Medications:  .  acetaminophen (TYLENOL) tablet 650 mg, 650 mg, Oral, Q6H PRN, Barton Dubois, MD, 650 mg at 02/06/18 0604 .  amLODipine (NORVASC) tablet 5 mg, 5 mg, Oral, Daily, Elgergawy, Silver Huguenin, MD, 5 mg at 02/09/18 1000 .  atorvastatin (LIPITOR) tablet 80 mg, 80 mg, Oral, Daily, Barton Dubois, MD, 80 mg at 02/09/18 1000 .  diclofenac sodium (VOLTAREN) 1 % transdermal gel 2 g, 2 g, Topical, QID PRN, Bodenheimer, Charles A, NP, 2 g at 02/09/18 1001 .  ezetimibe (ZETIA) tablet 10 mg, 10 mg, Oral, Daily, Barton Dubois, MD, 10 mg at 02/09/18 0959 .  fluticasone (FLONASE) 50 MCG/ACT nasal spray 1 spray, 1 spray, Each Nare, Daily PRN, Barton Dubois, MD .  heparin injection 5,000 Units, 5,000 Units, Subcutaneous, Q8H, Barton Dubois, MD, 5,000 Units at 02/09/18 0524 .  hydrALAZINE (APRESOLINE) injection 10 mg, 10 mg, Intravenous, Q8H PRN, Barton Dubois, MD, 10 mg at 02/08/18 0502 .  isosorbide mononitrate (IMDUR) 24 hr tablet 30 mg, 30 mg, Oral, Daily, Barton Dubois, MD, 30  mg at 02/09/18 0959 .  metoprolol succinate (TOPROL-XL) 24 hr tablet 25 mg, 25 mg, Oral, QHS, Barton Dubois, MD, 25 mg at 02/08/18 2146 .  multivitamin with minerals tablet 1 tablet, 1 tablet, Oral, Daily, Barton Dubois, MD, 1 tablet at 02/09/18 0959 .  oxyCODONE (Oxy IR/ROXICODONE) immediate release tablet 5-10 mg, 5-10 mg, Oral, Q4H PRN, Cherene Altes, MD, 10 mg at 02/09/18 0428 .  polyethylene glycol (MIRALAX / GLYCOLAX) packet 17 g, 17 g, Oral, BID, Elgergawy, Silver Huguenin, MD, 17 g at 02/09/18 1001 .  ramipril (ALTACE) capsule 10 mg, 10 mg, Oral, Daily, Cherene Altes, MD, 10 mg at 02/09/18 1000 .  traMADol (ULTRAM) tablet 50 mg, 50 mg, Oral, Q8H PRN, Cherene Altes, MD, 50 mg at 02/09/18 1000  Patients Current Diet:     Diet Order                  Diet Heart Room service appropriate? Yes; Fluid consistency: Thin  Diet effective now               Precautions / Restrictions Precautions Precautions: Fall Restrictions Weight Bearing Restrictions: No   Has the patient had 2 or more falls or a fall with injury in the past year?Yes the fall that lead to this admission   Prior Activity Level Community (5-7x/wk): Prior to admission patient was fully independent and active.  He was out in the community daily and enjoys walking, palying golf, and playing soccer with his grandchildren.   Home Assistive Devices / Equipment Home Assistive Devices/Equipment: None Home Equipment: Environmental consultant - 2 wheels, Cane - single point, Crutches, Hand held shower head, Shower seat  Prior Device Use: Indicate devices/aids used by the patient prior to current illness, exacerbation or injury? None of the above  Prior Functional Level Prior Function Level of Independence: Independent Comments: active  Self Care: Did the patient need help bathing, dressing, using the toilet or eating? Independent  Indoor Mobility: Did the patient need assistance with walking from room to room  (with or without device)? Independent  Stairs: Did the  patient need assistance with internal or external stairs (with or without device)? Independent  Functional Cognition: Did the patient need help planning regular tasks such as shopping or remembering to take medications? Independent  Current Functional Level Cognition  Overall Cognitive Status: Within Functional Limits for tasks assessed Orientation Level: Oriented X4 General Comments: decreased insight into deficits until stair training today    Extremity Assessment (includes Sensation/Coordination)  Upper Extremity Assessment: RUE deficits/detail, LUE deficits/detail RUE Deficits / Details: Patient has decreased sensation and is having difficulty performing [place] activies RUE Sensation: decreased light touch RUE Coordination: decreased fine motor LUE Deficits / Details: Patient has decreased sensation and is having difficulty performing [place] activies LUE Sensation: decreased light touch LUE Coordination: decreased fine motor  Lower Extremity Assessment: Defer to PT evaluation RLE Deficits / Details: 4/5 strength, paresthesia distally RLE Sensation: decreased proprioception RLE Coordination: decreased fine motor LLE Deficits / Details: 4/5 strength, paresthesia distally LLE Sensation: decreased proprioception LLE Coordination: decreased fine motor    ADLs  Overall ADL's : Needs assistance/impaired Eating/Feeding: Set up Grooming: Moderate assistance, Standing Grooming Details (indicate cue type and reason): Leaning into sink for external support. Difficulty with [place] tasks Upper Body Bathing: Minimal assistance, Sitting Lower Body Bathing: Minimal assistance, Sit to/from stand Upper Body Dressing : Minimal assistance, Sitting Lower Body Dressing: Min guard, Sit to/from stand Toilet Transfer: Min guard, Ambulation Toileting- Clothing Manipulation and Hygiene: Minimal assistance Functional mobility during ADLs: Minimal  assistance, Rolling walker General ADL Comments: Pt completed functional mobility, grooming tasks at sink.     Mobility  Overal bed mobility: Needs Assistance Bed Mobility: Sit to Supine Supine to sit: Supervision Sit to supine: Supervision General bed mobility comments: up in chair    Transfers  Overall transfer level: Needs assistance Equipment used: Rolling walker (2 wheeled) Transfers: Sit to/from Stand Sit to Stand: Min assist, Mod assist Stand pivot transfers: Min assist General transfer comment: Cues for technique with rw and safety 2/2 decreased sensation. Steadying assist    Ambulation / Gait / Stairs / Wheelchair Mobility  Ambulation/Gait Ambulation/Gait assistance: Min assist, Mod assist, +2 safety/equipment Gait Distance (Feet): (51ft then 39ft with seated rest break) Assistive device: Rolling walker (2 wheeled) Gait Pattern/deviations: Step-through pattern, Decreased stride length, Narrow base of support, Decreased dorsiflexion - right, Decreased dorsiflexion - left(bilat knee flexion and weakness) General Gait Details: pt requires assistance for balance and with heavy reliance on bilat UE support; increased assistance with distance due to fatigue and increased bilat LE weakness Gait velocity: decreased Gait velocity interpretation: <1.31 ft/sec, indicative of household ambulator Stairs: Yes Stairs assistance: Mod assist Stair Management: One rail Right, Sideways, Step to pattern General stair comments: pt lacks strenght to step up onto step with assistance and use of bilat UE on rail    Posture / Balance Balance Overall balance assessment: Needs assistance Sitting-balance support: No upper extremity supported, Feet supported Sitting balance-Leahy Scale: Good Standing balance support: Bilateral upper extremity supported, During functional activity Standing balance-Leahy Scale: Poor Standing balance comment: external support needed for static standing      Special needs/care consideration BiPAP/CPAP: No CPM: No Continuous Drip IV: No Dialysis: No Life Vest: No Oxygen: No Special Bed: No Trach Size: No Wound Vac (area): No Skin: WDL, dry                               Bowel mgmt: Continent, last BM 02/08/18 Bladder mgmt: Continent  Diabetic mgmt: No     Previous Home Environment Living Arrangements: Spouse/significant other Available Help at Discharge: Family, Available 24 hours/day Type of Home: House Home Layout: Two level, Bed/bath upstairs Alternate Level Stairs-Rails: Right Alternate Level Stairs-Number of Steps: 13 Home Access: Stairs to enter Entrance Stairs-Rails: Right, Left Entrance Stairs-Number of Steps: 3 Bathroom Shower/Tub: Gaffer, Charity fundraiser: Standard Bathroom Accessibility: Yes How Accessible: Accessible via wheelchair [place]: No  Discharge Living Setting Plans for Discharge Living Setting: Patient's home, Lives with (comment)(Spouse) Type of Home at Discharge: House Discharge Home Layout: Two level, Other (Comment)(bathroom on main level ) Alternate Level Stairs-Rails: Right Alternate Level Stairs-Number of Steps: 13 Discharge Home Access: Stairs to enter Entrance Stairs-Rails: Right, Left Entrance Stairs-Number of Steps: 3 Discharge Bathroom Shower/Tub: Walk-in shower, Door Discharge Bathroom Toilet: Standard Discharge Bathroom Accessibility: Yes How Accessible: Accessible via walker(and likely wheelchair if needed) Does the patient have any problems obtaining your medications?: No  Social/Family/Support Systems Patient Roles: Spouse, Parent, Other (Comment)(grandparent ) Contact Information: Spouse: Physicist, medical  Anticipated Caregiver: Spouse Anticipated Caregiver's Contact Information: [phone] Ability/Limitations of Caregiver: None Caregiver Availability: 24/7 Discharge Plan Discussed with Primary Caregiver: (Discussed with patient; goals Mod I-Supervision  ) Is Caregiver In Agreement with Plan?: (Patient in agreement with plan) Does Caregiver/Family have Issues with Lodging/Transportation while Pt is in Rehab?: No  Goals/Additional Needs Patient/Family Goal for Rehab: PT/OT: Mod I-Supervision  Expected length of stay: 12-15 days  Cultural Considerations: None Dietary Needs: Heart Healthy diet restrictions  Equipment Needs: TBD Special Service Needs: None Pt/Family Agrees to Admission and willing to participate: Yes Program Orientation Provided & Reviewed with Pt/Caregiver Including Roles  & Responsibilities: Yes  Barriers to Discharge: Home environment access/layout(needs to be able to do stairs to get to master bed and bath )  Decrease burden of Care through IP rehab admission: No  Possible need for SNF placement upon discharge: No  Patient Condition: This patient's condition remains as documented in the consult dated 02/07/18 at 0936, in which the Rehabilitation Physician determined and documented that the patient's condition is appropriate for intensive rehabilitative care in an inpatient rehabilitation facility. Will admit to inpatient rehab today.  Preadmission Screen Completed By:  Gunnar Fusi, 02/09/2018 10:51 AM ______________________________________________________________________   Discussed status with Dr. Letta Pate on 02/09/18 at 26 and received telephone approval for admission today.  Admission Coordinator:  Gunnar Fusi, time 1550/Date 03/11/18           Cosigned by: Charlett Blake, MD at 02/09/2018 10:53 AM  Revision History

## 2018-02-09 NOTE — Progress Notes (Signed)
Patient going to 4MW04 CIR, report called to nurse Kayla. Patient transported via wheelchair accompanied by nursing students. Wife at beside and aware of plan of care. No c/o pain or shortness of breath.  Kaziah Krizek, Tivis Ringer, RN

## 2018-02-09 NOTE — Progress Notes (Signed)
Physical Medicine and Rehabilitation Consult Reason for Consult: Decreased functional mobility Referring Physician: [facility]   HPI: Henry Morrison is a 72 y.o. right-handed male with history of CAD status post stenting, skin cancer, hyperlipidemia, hypertension.  Per chart review and patient, patient lives with spouse.  Independent and active prior to admission.  Two-level home with bed and bath upstairs.  3 steps to entry.  Good support of wife and extended family.  Presented 02/03/2018 with lower extremity weakness and paresthesia.  Patient had a flu shot approximately 1 week prior to admission and 4 days later began experiencing paresthesias in the lower extremities.  Cranial CT scan reviewed, unremarkable for acute intracranial process. CK elevated 604.  Lumbar puncture completed showed protein of 58 and a cell count of 2.  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  Subcutaneous heparin for DVT prophylaxis.  Tolerating a regular diet.  Therapy evaluations completed with recommendations of physical medicine rehab consult.   Review of Systems  Constitutional: Negative for chills and fever.  HENT: Negative for hearing loss.   Eyes: Negative for blurred vision and double vision.  Respiratory: Negative for cough and shortness of breath.   Cardiovascular: Negative for chest pain, palpitations and leg swelling.  Gastrointestinal: Positive for constipation. Negative for nausea and vomiting.  Genitourinary: Negative for dysuria, flank pain and hematuria.  Musculoskeletal: Positive for myalgias.  Skin: Negative for rash.  Neurological: Positive for tingling, sensory change, focal weakness and weakness.  All other systems reviewed and are negative.      Past Medical History:  Diagnosis Date  . Arthritis   . CAD (coronary artery disease)   . Cancer ([place])    skin cancer   . Hyperlipidemia   . Primary localized osteoarthritis of right knee 06/09/2015          Past Surgical History:  Procedure Laterality Date  . COLONOSCOPY    . CORONARY ANGIOPLASTY WITH STENT PLACEMENT  10/2000   stenting of the CX vessel  . KNEE ARTHROSCOPY W/ MENISCECTOMY Left 07/15/2006  . KNEE ARTHROSCOPY W/ MENISCECTOMY Right 02/09/2012  . removal of skin cancer     right arm  . TOTAL KNEE ARTHROPLASTY Right 06/09/2015   Procedure: RIGHT TOTAL KNEE ARTHROPLASTY;  Surgeon: Elsie Saas, MD;  Location: [facility];  Service: Orthopedics;  Laterality: Right;  . US ECHOCARDIOGRAPHY  03/07/2012   Trace AI,mild MR,mild to mod TR        Family History  Problem Relation Age of Onset  . Heart disease Mother   . Cancer Father        kidney   Social History:  reports that he has never smoked. He has never used smokeless tobacco. He reports that he drinks about 10.0 standard drinks of alcohol per week. He reports that he does not use drugs. Allergies:       Allergies  Allergen Reactions  . Influenza Vaccines     Guillain-Barr  . Other     Seasonal allergies         Medications Prior to Admission  Medication Sig Dispense Refill  . acetaminophen (TYLENOL) 500 MG tablet Take 1,000 mg by mouth daily as needed (for pain).     Marland Kitchen atorvastatin (LIPITOR) 80 MG tablet take 1 tablet by mouth once daily 30 tablet 11  . Evolocumab (REPATHA SURECLICK) 854 MG/ML SOAJ Inject 140 mg into the skin every 14 (fourteen) days. 2 pen 12  . ezetimibe (ZETIA) 10 MG  tablet take 1 tablet by mouth once daily 30 tablet 11  . fluticasone (FLONASE) 50 MCG/ACT nasal spray Place 1 spray into both nostrils as needed for allergies.     . isosorbide mononitrate (IMDUR) 30 MG 24 hr tablet take 1 tablet by mouth once daily 30 tablet 11  . metoprolol succinate (TOPROL-XL) 25 MG 24 hr tablet take 1 tablet by mouth once daily 30 tablet 11  . Multiple Vitamin (MULTIVITAMIN) tablet Take 1 tablet by mouth daily.    . ramipril (ALTACE) 10 MG capsule take 1 capsule by mouth once daily  30 capsule 11    Home: Home Living Family/patient expects to be discharged to:: Private residence Living Arrangements: Spouse/significant other Available Help at Discharge: Family, Available 24 hours/day Type of Home: House Home Access: Stairs to enter [employer] of Steps: 3 Entrance Stairs-Rails: Right, Left Home Layout: Two level, Bed/bath upstairs Alternate Level Stairs-Number of Steps: 13 Alternate Level Stairs-Rails: Right Bathroom Shower/Tub: Gaffer, Door [employer] Toilet: Standard Bathroom Accessibility: Yes Home Equipment: Environmental consultant - 2 wheels, Cane - single point, Crutches, Hand held shower head, Shower seat  Functional History: Prior Function Level of Independence: Independent Comments: active Functional Status:  Mobility: Bed Mobility Overal bed mobility: Needs Assistance Bed Mobility: Supine to Sit Supine to sit: HOB elevated, Supervision General bed mobility comments: Use of rail and incr time and effort Transfers Overall transfer level: Needs assistance Equipment used: Rolling walker (2 wheeled) Transfers: Sit to/from Stand Sit to Stand: Mod assist, Min assist Stand pivot transfers: Min assist General transfer comment: Cues for hand placement to push from seated surface.  Required mod assistance to eccentrically load and min assist to ascend from seated surface.   Ambulation/Gait Ambulation/Gait assistance: Min assist, Mod assist, +2 safety/equipment(initially min assistance but as patient fatigues he required moderate assistance to maintain balance during gait training. ) Gait Distance (Feet): 40 Feet(x2 with seated rest break mid way.  ) Assistive device: Rolling walker (2 wheeled) Gait Pattern/deviations: Step-through pattern, Decreased stride length, Narrow base of support, Decreased dorsiflexion - right, Decreased dorsiflexion - left General Gait Details: Pt with heavy reliance on UE's on walker. Verbal cues to widen base of support. As  pt fatigues requires more assist due to knee instability and tendency to almost scissor feet.   Scissoring occurs as L over R.  LLE is significantly weaker.  Pt required cues for B dorsiflexion to avoid buckling and promote activation of quads in stance phase.   Gait velocity: decreased Gait velocity interpretation: <1.31 ft/sec, indicative of household ambulator  ADL: ADL Overall ADL's : Needs assistance/impaired Eating/Feeding: Set up Grooming: Wash/dry hands, Wash/dry face, Moderate assistance Grooming Details (indicate cue type and reason): Patient needs assist with opening containers and shaving Upper Body Bathing: Minimal assistance, Sitting Lower Body Bathing: Minimal assistance, Sit to/from stand Upper Body Dressing : Minimal assistance, Sitting Lower Body Dressing: Minimal assistance Toilet Transfer: Minimal assistance, Stand-pivot Toileting- Clothing Manipulation and Hygiene: Moderate assistance Functional mobility during ADLs: Minimal assistance General ADL Comments: Patient is having difficulty with performing ADLs secondary to decreased senation in hands and feet.  Cognition: Cognition Overall Cognitive Status: Within Functional Limits for tasks assessed Orientation Level: Oriented X4 Cognition Arousal/Alertness: Awake/alert Behavior During Therapy: WFL for tasks assessed/performed Overall Cognitive Status: Within Functional Limits for tasks assessed  Blood pressure (!) 156/84, pulse 72, temperature 98.4 F (36.9 C), temperature source Oral, resp. rate 14, height 5\' 11"  (1.803 m), weight 90 kg, SpO2 97 %. Physical Exam  Vitals reviewed. Constitutional: He is oriented to person, place, and time. He appears well-developed and well-nourished.  HENT:  Head: Normocephalic and atraumatic.  Eyes: EOM are normal. Right eye exhibits no discharge. Left eye exhibits no discharge.  Neck: Normal range of motion. Neck supple. No thyromegaly present.  Cardiovascular: Normal  rate, regular rhythm and normal heart sounds.  Respiratory: Effort normal and breath sounds normal. No respiratory distress.  GI: Soft. Bowel sounds are normal. He exhibits no distension.  Musculoskeletal:  No edema or tenderness in extremities  Neurological: He is alert and oriented to person, place, and time.  Motor: B/l UE 4+/5 proximal to distal B/l LE: HF, KE 4/5, ADF 4+/5 Sensation subjectively diminished to light touch b/l feets and finger tips  Skin: Skin is warm and dry.  Psychiatric: He has a normal mood and affect. His behavior is normal. Thought content normal.  Assessment/Plan: Diagnosis: GBS  Labs and images (see above) independently reviewed.  Records reviewed and summated above.  1. Does the need for close, 24 hr/day medical supervision in concert with the patient's rehab needs make it unreasonable for this patient to be served in a less intensive setting? Yes 2. Co-Morbidities requiring supervision/potential complications: CAD status post stenting (cont meds), skin cancer, hyperlipidemia, HTN (monitor and provide prns in accordance with increased physical exertion and pain), prediabetes (Monitor in accordance with exercise and adjust meds as necessary), hyponatremia (cont to monitor, treat if necessary) 3. Due to safety, disease management and patient education, does the patient require 24 hr/day rehab nursing? Yes 4. Does the patient require coordinated care of a physician, rehab nurse, PT (1-2 hrs/day, 5 days/week) and OT (1-2 hrs/day, 5 days/week) to address physical and functional deficits in the context of the above medical diagnosis(es)? Yes Addressing deficits in the following areas: balance, endurance, locomotion, strength, transferring, bathing, dressing, toileting and psychosocial support 5. Can the patient actively participate in an intensive therapy program of at least 3 hrs of therapy per day at least 5 days per week? Yes 6. The potential for patient to make  measurable gains while on inpatient rehab is excellent 7. Anticipated functional outcomes upon discharge from inpatient rehab are modified independent and supervision  with PT, modified independent and supervision with OT, n/a with SLP. 8. Estimated rehab length of stay to reach the above functional goals is: 12-15 days. 9. Anticipated D/C setting: Home 10. Anticipated post D/C treatments: HH therapy and Home excercise program 11. Overall Rehab/Functional Prognosis: excellent  RECOMMENDATIONS: This patient's condition is appropriate for continued rehabilitative care in the following setting: CIR Patient has agreed to participate in recommended program. Yes Note that insurance prior authorization may be required for reimbursement for recommended care.  Comment: Rehab Admissions Coordinator to follow up.  I have personally performed a face to face diagnostic evaluation, including, but not limited to relevant history and physical exam findings, of this patient and developed relevant assessment and plan.  Additionally, I have reviewed and concur with the physician assistant's documentation above.   Delice Lesch, MD, ABPMR Lavon Paganini Angiulli, PA-C 02/07/2018        Revision History                        Routing History

## 2018-02-09 NOTE — Plan of Care (Signed)

## 2018-02-10 ENCOUNTER — Inpatient Hospital Stay (HOSPITAL_COMMUNITY): Payer: Medicare Other | Admitting: Occupational Therapy

## 2018-02-10 ENCOUNTER — Inpatient Hospital Stay (HOSPITAL_COMMUNITY): Payer: Medicare Other | Admitting: Physical Therapy

## 2018-02-10 LAB — CBC WITH DIFFERENTIAL/PLATELET
Abs Immature Granulocytes: 0 10*3/uL (ref 0.0–0.1)
BASOS ABS: 0 10*3/uL (ref 0.0–0.1)
BASOS PCT: 0 %
EOS ABS: 0.1 10*3/uL (ref 0.0–0.7)
EOS PCT: 2 %
HCT: 42.4 % (ref 39.0–52.0)
Hemoglobin: 14 g/dL (ref 13.0–17.0)
Immature Granulocytes: 0 %
LYMPHS ABS: 1.5 10*3/uL (ref 0.7–4.0)
Lymphocytes Relative: 28 %
MCH: 30.2 pg (ref 26.0–34.0)
MCHC: 33 g/dL (ref 30.0–36.0)
MCV: 91.4 fL (ref 78.0–100.0)
Monocytes Absolute: 1 10*3/uL (ref 0.1–1.0)
Monocytes Relative: 18 %
NEUTROS PCT: 52 %
Neutro Abs: 2.8 10*3/uL (ref 1.7–7.7)
PLATELETS: 227 10*3/uL (ref 150–400)
RBC: 4.64 MIL/uL (ref 4.22–5.81)
RDW: 13.9 % (ref 11.5–15.5)
WBC: 5.4 10*3/uL (ref 4.0–10.5)

## 2018-02-10 LAB — COMPREHENSIVE METABOLIC PANEL
ALBUMIN: 3.4 g/dL — AB (ref 3.5–5.0)
ALT: 137 U/L — ABNORMAL HIGH (ref 0–44)
AST: 81 U/L — ABNORMAL HIGH (ref 15–41)
Alkaline Phosphatase: 80 U/L (ref 38–126)
Anion gap: 9 (ref 5–15)
BUN: 50 mg/dL — ABNORMAL HIGH (ref 8–23)
CO2: 23 mmol/L (ref 22–32)
Calcium: 9.6 mg/dL (ref 8.9–10.3)
Chloride: 101 mmol/L (ref 98–111)
Creatinine, Ser: 1.53 mg/dL — ABNORMAL HIGH (ref 0.61–1.24)
GFR calc non Af Amer: 44 mL/min — ABNORMAL LOW (ref >60)
GFR, EST AFRICAN AMERICAN: 51 mL/min — AB (ref >60)
Glucose, Bld: 97 mg/dL (ref 70–99)
POTASSIUM: 5 mmol/L (ref 3.5–5.1)
Sodium: 133 mmol/L — ABNORMAL LOW (ref 135–145)
Total Bilirubin: 1.1 mg/dL (ref 0.3–1.2)
Total Protein: 8.7 g/dL — ABNORMAL HIGH (ref 6.5–8.1)

## 2018-02-10 MED ORDER — SODIUM CHLORIDE 0.45 % IV SOLN
INTRAVENOUS | Status: DC
  Administered 2018-02-10 – 2018-02-11 (×2): via INTRAVENOUS

## 2018-02-10 MED ORDER — ENOXAPARIN SODIUM 40 MG/0.4ML ~~LOC~~ SOLN
40.0000 mg | SUBCUTANEOUS | Status: DC
  Administered 2018-02-11 – 2018-02-13 (×3): 40 mg via SUBCUTANEOUS
  Filled 2018-02-10 (×4): qty 0.4

## 2018-02-10 MED ORDER — POLYETHYLENE GLYCOL 3350 17 G PO PACK
17.0000 g | PACK | Freq: Every day | ORAL | Status: DC
  Administered 2018-02-10: 17 g via ORAL
  Filled 2018-02-10 (×4): qty 1

## 2018-02-10 NOTE — Progress Notes (Signed)
Patient information reviewed and entered into eRehab system by Carys Malina, RN, CRRN, PPS Coordinator.  Information including medical coding, functional ability and quality indicators will be reviewed and updated through discharge.     Per nursing patient was given "Data Collection Information Summary for Patients in Inpatient Rehabilitation Facilities with attached "Privacy Act Statement-Health Care Records" upon admission.  

## 2018-02-10 NOTE — Progress Notes (Signed)
Nurse assessed ortho VS, pt tolerated well, slight dizziness when sitting to standing but not as bad as before when participating in therapy. Pt stated that he felt like during therapy he overdone it and that's what caused dizziness. Nurse placed thigh high TED's per order. Sitting in recliner with call bell within reach. Will cont to monitor.   Erie Noe, LPN

## 2018-02-10 NOTE — Progress Notes (Signed)
A.m. labs show BUN 50 creatinine 1.53.  Patient noted to be a bit orthostatic as well as mild hyperkalemia 5.0.  Will hold Altace and begin IV fluids with follow-up chemistries in a.m.

## 2018-02-10 NOTE — Care Management Note (Signed)
[facility] Individual Statement of Services  Patient Name:  Henry Morrison  Date:  02/10/2018  Welcome to the [facility].  Our goal is to provide you with an individualized program based on your diagnosis and situation, designed to meet your specific needs.  With this comprehensive rehabilitation program, you will be expected to participate in at least 3 hours of rehabilitation therapies Monday-Friday, with modified therapy programming on the weekends.  Your rehabilitation program will include the following services:  Physical Therapy (PT), Occupational Therapy (OT), 24 hour per day rehabilitation nursing, Neuropsychology, Case Management (Social Worker), Rehabilitation Medicine, Nutrition Services and Pharmacy Services  Weekly team conferences will be held on Wednesday to discuss your progress.  Your Social Worker will talk with you frequently to get your input and to update you on team discussions.  Team conferences with you and your family in attendance may also be held.  Expected length of stay: 7 days  Overall anticipated outcome: independent with device  Depending on your progress and recovery, your program may change. Your Social Worker will coordinate services and will keep you informed of any changes. Your Social Worker's name and contact numbers are listed  below.  The following services may also be recommended but are not provided by the [facility] will be made to provide these services after discharge if needed.  Arrangements include referral to agencies that provide these services.  Your insurance has been verified to be:  UHC-Medicare Your primary doctor is:  Leanna Battles  Pertinent information will be shared with your doctor and your insurance company.  Social Worker:  Ovidio Kin, [place] or ([phone]  Information discussed with and copy given to patient by: Elease Hashimoto, 02/10/2018, 10:09 AM

## 2018-02-10 NOTE — Progress Notes (Signed)
Social Work  Social Work Assessment and Plan  Patient Details  Name: Henry Morrison MRN: [id number] Date of Birth: 12/18/1945  Today's Date: 02/10/2018  Problem List:  Patient Active Problem List   Diagnosis Date Noted  . GBS (Guillain Barre syndrome) ([place]) 02/09/2018  . Guillain Barr syndrome ([facility])   . Dyslipidemia   . Benign essential HTN   . Prediabetes   . Hyponatremia   . Ascending paralysis ([place]) 02/03/2018  . Hyperglycemia 02/03/2018  . Primary localized osteoarthritis of right knee 06/09/2015  . DJD (degenerative joint disease) of knee 06/09/2015  . Preoperative clearance 07/06/2014  . CAD (coronary artery disease) 06/14/2013  . Familial hyperlipidemia, high LDL 06/14/2013  . Essential hypertension 06/14/2013  . Melanoma ([place]) 06/14/2013   Past Medical History:  Past Medical History:  Diagnosis Date  . Arthritis   . CAD (coronary artery disease)   . Cancer ([place])    skin cancer   . Hyperlipidemia   . Primary localized osteoarthritis of right knee 06/09/2015   Past Surgical History:  Past Surgical History:  Procedure Laterality Date  . COLONOSCOPY    . CORONARY ANGIOPLASTY WITH STENT PLACEMENT  10/2000   stenting of the CX vessel  . KNEE ARTHROSCOPY W/ MENISCECTOMY Left 07/15/2006  . KNEE ARTHROSCOPY W/ MENISCECTOMY Right 02/09/2012  . removal of skin cancer     right arm  . TOTAL KNEE ARTHROPLASTY Right 06/09/2015   Procedure: RIGHT TOTAL KNEE ARTHROPLASTY;  Surgeon: Elsie Saas, MD;  Location: [facility];  Service: Orthopedics;  Laterality: Right;  . US ECHOCARDIOGRAPHY  03/07/2012   Trace AI,mild MR,mild to mod TR   Social History:  reports that he has never smoked. He has never used smokeless tobacco. He reports that he drinks about 10.0 standard drinks of alcohol per week. He reports that he does not use drugs.  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 Very active with them prior to admission here. He and his wife have good friends and church members who are supportive and involved.  Social History Preferred language: English Religion: Catholic Cultural Background: No issues Education: Secretary/administrator educated Read: Yes Write: Yes Employment Status: Retired Freight forwarder Issues: No issues Guardian/Conservator: None-according to MD pt is capable of making his own decisions while here. Wife is here daily to provide support   Abuse/Neglect Abuse/Neglect Assessment Can Be Completed: Yes Physical Abuse: Denies Verbal Abuse: Denies Sexual Abuse: Denies Exploitation of patient/patient's resources: Denies Self-Neglect: Denies  Emotional Status Pt's affect, behavior adn adjustment status: Pt is motivated to do well but is having BP issues this am after his two therapies. He is relaxing now, but feels he will do well here and not be here long. He is hopeful he can reach mod/i level and get home soon once medically ready. He has always been independent and taken care of himself Recent Psychosocial Issues: other health issues managed by PCP-knee issues Pyschiatric History: No history deferred depression screen due to pt doing well and needs time to recover from this. He is able to verbalize and knows he needs to be patient to recover from this Substance Abuse History: No issues  Patient / Family Perceptions, Expectations & Goals Pt/Family understanding of illness &  functional limitations: Pt and wife can explain his illness and and recovery process. He does talk with the MD daily and feels he is doing well and recovering from his  GBS. Both concerned that a flu shot could cause this to happen. Premorbid pt/family roles/activities: Husband, father, grandfather, retiree, church member, Air cabin crew, friend, etc Anticipated changes in roles/activities/participation: resume Pt/family expectations/goals: Pt states: " I want to be able to do for myself before I leave here, I will get there." Wife states: " I can see he is doing so much better and will assist if needed."  [employer]: None Premorbid Home Care/DME Agencies: Other (Comment)(has equipment from past knee surgery ) Transportation available at discharge: family Resource referrals recommended: Support group (specify)  Discharge Planning Living Arrangements: Spouse/significant other Support Systems: Spouse/significant other, Children, Other relatives, Friends/neighbors, Church/faith community Type of Residence: Private residence Insurance Resources: Multimedia programmer (specify)(UHC-Medicare) Financial Resources: Radio broadcast assistant Screen Referred: No Living Expenses: Own Money Management: Patient, Spouse Does the patient have any problems obtaining your medications?: No Home Management: Both wife does the inside work and pt does the outside work Magazine features editor Plans: Return home with wife who can assist if needed. Both are hopeful he will be mod/i and not need assistance. He is making progress daily but is having BP issues this am. Wife plans to be here to observe in therapies to see his progress and learn his care. Social Work Anticipated Follow Up Needs: HH/OP, Support Group  Clinical Impression Pleasant gentleman who is doing well from his unfortunate GBS from a flu shot. His family is involved and supportive and will assist at discharge if needed. Pt was very active and wants to be mod/i level before going ho,e. Will await therapy evaluations and work on discharge needs.  Elease Hashimoto 02/10/2018, 11:23 AM

## 2018-02-10 NOTE — Evaluation (Signed)
Occupational Therapy Assessment and Plan  Patient Details  Name: Henry Morrison MRN: [id number] Date of Birth: 1945-06-22  OT Diagnosis: muscle weakness (generalized) Rehab Potential: Rehab Potential (ACUTE ONLY): Excellent ELOS: 7 days   Today's Date: 02/10/2018 OT Individual Time: 8527-7824 OT Individual Time Calculation (min): 75 min     Problem List:  Patient Active Problem List   Diagnosis Date Noted  . 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 Guillain Barr syndrome ([place])   . Dyslipidemia   . Benign essential HTN   . Prediabetes   . Hyponatremia   . Ascending paralysis ([place]) 02/03/2018  . Hyperglycemia 02/03/2018  . Primary localized osteoarthritis of right knee 06/09/2015  . DJD (degenerative joint disease) of knee 06/09/2015  . Preoperative clearance 07/06/2014  . CAD (coronary artery disease) 06/14/2013  . Familial hyperlipidemia, high LDL 06/14/2013  . Essential hypertension 06/14/2013  . Melanoma ([place]) 06/14/2013    Past Medical History:  Past Medical History:  Diagnosis Date  . Arthritis   . CAD (coronary artery disease)   . Cancer ([place])    skin cancer   . Hyperlipidemia   . Primary localized osteoarthritis of right knee 06/09/2015   Past Surgical History:  Past Surgical History:  Procedure Laterality Date  . COLONOSCOPY    . CORONARY ANGIOPLASTY WITH STENT PLACEMENT  10/2000   stenting of the CX vessel  . KNEE ARTHROSCOPY W/ MENISCECTOMY Left 07/15/2006  . KNEE ARTHROSCOPY W/ MENISCECTOMY Right 02/09/2012  . removal of skin cancer     right arm  . TOTAL KNEE ARTHROPLASTY Right 06/09/2015   Procedure: RIGHT TOTAL KNEE ARTHROPLASTY;  Surgeon: Elsie Saas, MD;  Location: [facility];  Service: Orthopedics;  Laterality: Right;  . US ECHOCARDIOGRAPHY  03/07/2012   Trace AI,mild MR,mild to mod TR    Assessment & Plan Clinical Impression: Henry Morrison is a 72 year old right-handed male with history of CAD with stenting, skin cancer, hyperlipidemia  and hypertension.  Per chart review and patient, patient lives with spouse.  Independent and active prior to admission.  2 level home with bed and bath upstairs.  3 steps to entry of home.  Good support of wife and extended family.  Presented 02/03/2018 with lower extremity weakness and paresthesia.  Patient had a flu shot approximately 1 week prior to admission and 4 days later began experiencing paresthesias in the lower extremities.  Cranial CT scan unremarkable for acute process.  CK elevated 604.  Lumbar puncture completed showing protein of 58 and a cell count of 2.  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  Subcutaneous heparin for DVT prophylaxis.  Tolerating a regular diet.  Therapy evaluations completed with recommendations of physical medicine rehab consult.  Patient was fitted for a comprehensive rehab program   Patient transferred to CIR on 02/09/2018 .    Patient currently requires supervision to steadying A with basic self-care skills secondary to unbalanced muscle activation and decreased coordination and decreased standing balance and LE weakness with decreased sensation.  Prior to hospitalization, patient was fully independent and active.  Patient will benefit from skilled intervention to increase independence with basic self-care skills prior to discharge home with care partner.  Anticipate patient will require intermittent supervision and no further OT follow recommended.  OT - End of Session Activity Tolerance: Tolerates 30+ min activity without fatigue Endurance Deficit Description: pt took a sit down rest break after his shower OT Assessment Rehab Potential (ACUTE ONLY): Excellent OT Patient  demonstrates impairments in the following area(s): Motor;Balance;Sensory OT Basic ADL's Functional Problem(s): Grooming;Bathing;Dressing;Toileting OT Advanced ADL's Functional Problem(s): Simple Meal Preparation;Light Housekeeping OT Transfers Functional  Problem(s): Toilet;Tub/Shower OT Additional Impairment(s): None OT Plan OT Intensity: Minimum of 1-2 x/day, 45 to 90 minutes OT Frequency: 5 out of 7 days OT Duration/Estimated Length of Stay: 7 days OT Treatment/Interventions: Balance/vestibular training;Discharge planning;DME/adaptive equipment instruction;Functional mobility training;Pain management;Patient/family education;Neuromuscular re-education;Self Care/advanced ADL retraining;Therapeutic Exercise;Therapeutic Activities;UE/LE Coordination activities;UE/LE Strength taining/ROM OT Self Feeding Anticipated Outcome(s): I OT Basic Self-Care Anticipated Outcome(s): mod I  OT Toileting Anticipated Outcome(s): mod I OT Bathroom Transfers Anticipated Outcome(s): mod I OT Recommendation Patient destination: Home Follow Up Recommendations: None Equipment Recommended: None recommended by OT   Skilled Therapeutic Intervention Pt seen for initial evaluation and ADL training with a focus on balance and activity tolerance.  Pt discussed how he was allowed to get up and go to the bathroom by himself on the other unit.  Explained why pt needs to call for A on the rehab unit and pt understood.  Discussed role of OT and pt's goals.  He stated his main obstacle was the decreased sensation in his fingertips and his legs.  He was eager to get moving today and walk around. Pt stood with S and then used RW with steadying A to ambulated around his room to the dresser to the closet to the shower and back to his recliner.  He showered with set up (therapist stood on other side of curtain), doffed and donned clothing in shower with distant supervision.  Ambulated back to recliner to rest.  Pt donned socks and shoes.  Needs A to tie shoes due to numbness in finger tips.   Pt then stated "wow I feel really dizzy".  Blood pressure 85/50.  Recliner reclined, feet elevated. After 5 min of rest BP 104/94.  Pt said he was no longer dizzy and willing to try to stand again.   Immediately felt dizzy.  Pt sat down. Blood pressure 73/46.  Feet elevated, head reclined.  Pt still able to verbalize well and did not feel like he "was out of it".  RN notified.  TED hose ordered, but pt will probably need Thigh high.  Pt instructed to rest in recliner with chair alarm on.  RN would get TED hose on later.   All needs met.    OT Evaluation Precautions/Restrictions  Precautions Precautions: Fall Restrictions Weight Bearing Restrictions: No  Pain Pain Assessment Pain Scale: 0-10 Pain Score: 0-No pain Home Living/Prior Functioning Home Living Family/patient expects to be discharged to:: Private residence Living Arrangements: Spouse/significant other Available Help at Discharge: Family, Available 24 hours/day Type of Home: House Home Access: Stairs to enter [employer] of Steps: 3 Entrance Stairs-Rails: Right, Left Home Layout: Two level, Bed/bath upstairs Alternate Level Stairs-Number of Steps: 13 Alternate Level Stairs-Rails: Right Bathroom Shower/Tub: Walk-in shower, Door Additional Comments: pt can live on main if needed, sleeping on couch and using half bath  Lives With: Spouse Prior Function Level of Independence: Independent with basic ADLs, Independent with transfers, Independent with gait, Independent with homemaking with ambulation  Able to Take Stairs?: Yes Driving: Yes Vocation: Retired Comments: active, helps around the house, [employer] and goes for walks ADL ADL Upper Body Bathing: Independent Where Assessed-Upper Body Bathing: Shower Lower Body Bathing: Supervision/safety Where Assessed-Lower Body Bathing: Shower Upper Body Dressing: Independent Where Assessed-Upper Body Dressing: Other (Comment) Lower Body Dressing: Supervision/safety Where Assessed-Lower Body Dressing: Other (Comment) Toileting: Supervision/safety Where Assessed-Toileting: Toilet  Toilet Transfer: Close supervision Toilet Transfer Method: Pensions consultant: Close supervision Social research officer, government Method: Heritage manager: Civil engineer, contracting with back Vision Baseline Vision/History: Wears glasses(contacts) Patient Visual Report: No change from baseline Vision Assessment?: No apparent visual deficits Perception  Perception: Within Functional Limits Praxis Praxis: Intact Cognition Overall Cognitive Status: Within Functional Limits for tasks assessed Arousal/Alertness: Awake/alert Orientation Level: Person;Place;Situation Person: Oriented Place: Oriented Situation: Oriented Year: 2019 Month: September Day of Week: Correct Memory: Appears intact Immediate Memory Recall: Sock;Blue;Bed Memory Recall: Sock;Blue;Bed Memory Recall Sock: Without Cue Memory Recall Blue: Without Cue Memory Recall Bed: Without Cue Safety/Judgment: Appears intact Sensation Sensation Light Touch: Impaired by gross assessment(pt reports numbness if B finger tips, and B LE) Hot/Cold: Appears Intact Proprioception: Impaired by gross assessment(due to decreased sensation in feet, LE proprioception minimally impaired) Stereognosis: Appears Intact Coordination Gross Motor Movements are Fluid and Coordinated: No Fine Motor Movements are Fluid and Coordinated: No Coordination and Movement Description: UE coordination WFL (he does have difficulty due decreased sensation with FM tasks such as brushing teeth), LE impaired Motor  Motor Motor - Skilled Clinical Observations: LE weakness Mobility    steadying A to walk with RW Trunk/Postural Assessment  Cervical Assessment Cervical Assessment: Within Functional Limits Thoracic Assessment Thoracic Assessment: Within Functional Limits Lumbar Assessment Lumbar Assessment: Within Functional Limits Postural Control Postural Control: Within Functional Limits  Balance Dynamic Sitting Balance Dynamic Sitting - Level of Assistance: 7: Independent Static Standing Balance Static Standing - Level  of Assistance: 4: Min assist Dynamic Standing Balance Dynamic Standing - Level of Assistance: 2: Max assist Extremity/Trunk Assessment RUE Assessment RUE Assessment: Within Functional Limits LUE Assessment LUE Assessment: Within Functional Limits   See Function Navigator for Current Functional Status.   Refer to Care Plan for Long Term Goals  Recommendations for other services: None    Discharge Criteria: Patient will be discharged from OT if patient refuses treatment 3 consecutive times without medical reason, if treatment goals not met, if there is a change in medical status, if patient makes no progress towards goals or if patient is discharged from hospital.  The above assessment, treatment plan, treatment alternatives and goals were discussed and mutually agreed upon: by patient  [facility] 02/10/2018, 10:17 AM

## 2018-02-10 NOTE — Progress Notes (Signed)
Occupational Therapy Session Note  Patient Details  Name: Henry Morrison MRN: [id number] Date of Birth: 11/21/1945  Today's Date: 02/10/2018 OT Individual Time: 1300-1345 OT Individual Time Calculation (min): 45 min  OT Missed Time 15 min (low BP)   Short Term Goals: Week 1:  OT Short Term Goal 1 (Week 1): STGs = LTGs  Skilled Therapeutic Interventions/Progress Updates:  Pt seen for OT session focusing on upright tolerance and fine motor coordination. Pt sitting up in recliner upon arrival, agreeable to tx session and denying pain. He voiced some complaints of dizziness/lightheadedness. BP assessed, 78/48. Pt reclined in recliner and BP reassessed ~5 minutes later 72/43. PA made aware and order placed for abdominal binder.  Completed fine motor coordination tasks and assessments with pt reclined back in recliner. 9 Hole peg test completed, see results below. Also completed in-hand manipulation and fine motor tasks with and without visual feedback, completed WNL.  Re-assessed BP. 78/48 reclined in recliner. Placed in upright sitting position with feet on floor, BP 65/50 with increase in symptoms. Returned to reclined position. Pt left in recliner with wife present. Missed 15 minutes of skilled tx time 2/2 low BP. No skilled intervention able to be performed any longer from reclined position.  Cont education regarding role of OT, POC, OT goals, and d/c planning  Therapy Documentation Precautions:  Precautions Precautions: Fall Restrictions Weight Bearing Restrictions: No Pain: Pain Assessment Pain Score: 0-No pain  See Function Navigator for Current Functional Status.   Therapy/Group: Individual Therapy  [name] 02/10/2018, 1:20 PM

## 2018-02-10 NOTE — Progress Notes (Signed)
Subjective/Complaints: C/o contipation no abd pain, normal perineal sensation  ROS  Neg CP, SOB, N/V/D  Objective: Vital Signs: Blood pressure (!) 162/94, pulse 83, temperature 97.6 F (36.4 C), temperature source Oral, resp. rate 20, height 5' 11" (1.803 m), weight 89.5 kg, SpO2 97 %. No results found. Results for orders placed or performed during the hospital encounter of 02/09/18 (from the past 72 hour(s))  CBC     Status: None   Collection Time: 02/09/18  2:41 PM  Result Value Ref Range   WBC 8.4 4.0 - 10.5 K/uL   RBC 4.68 4.22 - 5.81 MIL/uL   Hemoglobin 14.1 13.0 - 17.0 g/dL   HCT 42.8 39.0 - 52.0 %   MCV 91.5 78.0 - 100.0 fL   MCH 30.1 26.0 - 34.0 pg   MCHC 32.9 30.0 - 36.0 g/dL   RDW 14.2 11.5 - 15.5 %   Platelets 298 150 - 400 K/uL    Comment: Performed at [facility] [address] [place], [place] [address]  Creatinine, serum     Status: Abnormal   Collection Time: 02/09/18  2:41 PM  Result Value Ref Range   Creatinine, Ser 1.55 (H) 0.61 - 1.24 mg/dL   GFR calc non Af Amer 43 (L) >60 mL/min   GFR calc Af Amer 50 (L) >60 mL/min    Comment: (NOTE) The eGFR has been calculated using the CKD EPI equation. This calculation has not been validated in all clinical situations. eGFR's persistently <60 mL/min signify possible Chronic Kidney Disease. Performed at [facility], [place] [address] [place], [place] [address]   CBC WITH DIFFERENTIAL     Status: None   Collection Time: 02/10/18  5:46 AM  Result Value Ref Range   WBC 5.4 4.0 - 10.5 K/uL   RBC 4.64 4.22 - 5.81 MIL/uL   Hemoglobin 14.0 13.0 - 17.0 g/dL   HCT 42.4 39.0 - 52.0 %   MCV 91.4 78.0 - 100.0 fL   MCH 30.2 26.0 - 34.0 pg   MCHC 33.0 30.0 - 36.0 g/dL   RDW 13.9 11.5 - 15.5 %   Platelets 227 150 - 400 K/uL   Neutrophils Relative % 52 %   Neutro Abs 2.8 1.7 - 7.7 K/uL   Lymphocytes Relative 28 %   Lymphs Abs 1.5 0.7 - 4.0 K/uL   Monocytes Relative 18 %   Monocytes Absolute 1.0 0.1 - 1.0  K/uL   Eosinophils Relative 2 %   Eosinophils Absolute 0.1 0.0 - 0.7 K/uL   Basophils Relative 0 %   Basophils Absolute 0.0 0.0 - 0.1 K/uL   Immature Granulocytes 0 %   Abs Immature Granulocytes 0.0 0.0 - 0.1 K/uL    Comment: Performed at [facility], [address] [place], [place] [address]  Comprehensive metabolic panel     Status: Abnormal   Collection Time: 02/10/18  5:46 AM  Result Value Ref Range   Sodium 133 (L) 135 - 145 mmol/L   Potassium 5.0 3.5 - 5.1 mmol/L   Chloride 101 98 - 111 mmol/L   CO2 23 22 - 32 mmol/L   Glucose, Bld 97 70 - 99 mg/dL   BUN 50 (H) 8 - 23 mg/dL   Creatinine, Ser 1.53 (H) 0.61 - 1.24 mg/dL   Calcium 9.6 8.9 - 10.3 mg/dL   Total Protein 8.7 (H) 6.5 - 8.1 g/dL   Albumin 3.4 (L) 3.5 - 5.0 g/dL   AST 81 (H)  15 - 41 U/L   ALT 137 (H) 0 - 44 U/L   Alkaline Phosphatase 80 38 - 126 U/L   Total Bilirubin 1.1 0.3 - 1.2 mg/dL   GFR calc non Af Amer 44 (L) >60 mL/min   GFR calc Af Amer 51 (L) >60 mL/min    Comment: (NOTE) The eGFR has been calculated using the CKD EPI equation. This calculation has not been validated in all clinical situations. eGFR's persistently <60 mL/min signify possible Chronic Kidney Disease.    Anion gap 9 5 - 15    Comment: Performed at [facility] [address] [place], [place] [address]     HEENT: normal Cardio: RRR and no murmur Resp: CTA B/L and unlabored GI: BS positive and NT, ND Extremity:  No Edema Skin:   Intact Neuro: Alert/Oriented, Cranial Nerve II-XII normal, Abnormal Sensory reduced LT and proprio bilateral feet and Abnormal Motor 4- Bilateral ankle DF otherwise 5/5 Musc/Skel:  Normal and Other no pain with UE or LE ROM Gen NAD   Assessment/Plan: 1. 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 Physiatrist is providing close team supervision and 24 hour management of active  medical problems listed below. Physiatrist and rehab team continue to assess barriers to discharge/monitor patient progress toward functional and medical goals. FIM:                                 Medical Problem List and Plan: 1.Decreased functional mobilitysecondary to GBS. IVIG completed 02/07/2018 intial PT, OT evals today 2. DVT Prophylaxis/Anticoagulation: Subcutaneous heparin. Check vascular study 3. Pain Management:Oxycodone/Ultram as needed 4. Mood:Provide emotional support 5. Neuropsych: This patientiscapable of making decisions on hisown behalf. 6. Skin/Wound Care:Routine skin checks 7. Fluids/Electrolytes/Nutrition:Routine in and outs with follow-up chemistries 8.Hypertension. Imdur 30 mg daily, Toprol-XL 25 mg nightly, Altace 10 mg daily Vitals:   02/09/18 2038 02/10/18 0500  BP: 137/68 (!) 162/94  Pulse: 82 83  Resp: 18 20  Temp: 98 F (36.7 C) 97.6 F (36.4 C)  SpO2: 97% 97%  elevated 9/27 am monitor prior to change in meds 9.CAD with history of stenting. No chest pain or shortness of breath 10.Hyperlipidemia. Lipitor/Zetia 11.  Constipation likely related to poor mobility, neurogenic bowel less likely start miralax LOS (Days) 1 A FACE TO FACE EVALUATION WAS PERFORMED  Henry Morrison 02/10/2018, 10:04 AM

## 2018-02-10 NOTE — Progress Notes (Signed)
Physical Therapy Assessment and Plan  Patient Details  Name: Henry Morrison MRN: [id number] Date of Birth: 12-17-1945  PT Diagnosis: Abnormality of gait, Difficulty walking and Muscle weakness Rehab Potential: Excellent ELOS: 7 days   Today's Date: 02/10/2018 PT Individual Time: 4098-1191 PT Individual Time Calculation (min): 70 min    Problem List:  Patient Active Problem List   Diagnosis Date Noted  . GBS (Guillain Barre syndrome) ([place]) 02/09/2018  . Guillain Barr syndrome ([place])   . Dyslipidemia   . Benign essential HTN   . Prediabetes   . Hyponatremia   . Ascending paralysis ([place]) 02/03/2018  . Hyperglycemia 02/03/2018  . Primary localized osteoarthritis of right knee 06/09/2015  . DJD (degenerative joint disease) of knee 06/09/2015  . Preoperative clearance 07/06/2014  . CAD (coronary artery disease) 06/14/2013  . Familial hyperlipidemia, high LDL 06/14/2013  . Essential hypertension 06/14/2013  . Melanoma ([place]) 06/14/2013    Past Medical History:  Past Medical History:  Diagnosis Date  . Arthritis   . CAD (coronary artery disease)   . Cancer ([place])    skin cancer   . Hyperlipidemia   . Primary localized osteoarthritis of right knee 06/09/2015   Past Surgical History:  Past Surgical History:  Procedure Laterality Date  . COLONOSCOPY    . CORONARY ANGIOPLASTY WITH STENT PLACEMENT  10/2000   stenting of the CX vessel  . KNEE ARTHROSCOPY W/ MENISCECTOMY Left 07/15/2006  . KNEE ARTHROSCOPY W/ MENISCECTOMY Right 02/09/2012  . removal of skin cancer     right arm  . TOTAL KNEE ARTHROPLASTY Right 06/09/2015   Procedure: RIGHT TOTAL KNEE ARTHROPLASTY;  Surgeon: Elsie Saas, MD;  Location: [facility];  Service: Orthopedics;  Laterality: Right;  . US ECHOCARDIOGRAPHY  03/07/2012   Trace AI,mild MR,mild to mod TR    Assessment & Plan Clinical Impression:  Henry Morrison is a 72 year old right-handed male with history of CAD with stenting, skin cancer, hyperlipidemia and  hypertension. Per chart review and patient, patient lives with spouse. Independent and active prior to admission. 2 level home with bed and bath upstairs. 3 steps to entry of home. Good support of wife and extended family. Presented 02/03/2018 with lower extremity weakness and paresthesia. Patient had a flu shot approximately 1 week prior to admission and 4 days later began experiencing paresthesias in the lower extremities. Cranial CT scan unremarkable for acute process. CK elevated 604. Lumbar puncture completed showing protein of 58 and a cell count of 2. 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 Subcutaneous heparin for DVT prophylaxis. Tolerating a regular diet. Therapy evaluations completed with recommendations of physical medicine rehab consult. Patient was fitted for a comprehensive rehab program. Patient transferred to CIR on 02/09/2018 .   Patient currently requires min with mobility secondary to muscle weakness and decreased standing balance and decreased balance strategies.  Prior to hospitalization, patient was independent  with mobility and lived with Spouse in a House home.  Home access is 3Stairs to enter.  Patient will benefit from skilled PT intervention to maximize safe functional mobility, minimize fall risk and decrease caregiver burden for planned discharge home with family assist if needed.  Anticipate patient will benefit from follow up [place] at discharge.  PT - End of Session Activity Tolerance: Tolerates 30+ min activity with multiple rests Endurance Deficit: Yes Endurance Deficit Description: frequest seated rest breaks between activities PT Assessment Rehab Potential (ACUTE/IP ONLY): Excellent PT Barriers to Discharge: Medical stability PT Patient demonstrates  impairments in the following area(s): Balance;Endurance;Safety;Sensory PT Transfers Functional Problem(s): Bed Mobility;Bed to Chair;Car;Furniture;Floor PT Locomotion  Functional Problem(s): Ambulation;Stairs PT Plan PT Intensity: Minimum of 1-2 x/day ,45 to 90 minutes PT Frequency: 5 out of 7 days PT Duration Estimated Length of Stay: 7 days PT Treatment/Interventions: Ambulation/gait training;Balance/vestibular training;Community reintegration;Discharge planning;DME/adaptive equipment instruction;Functional mobility training;Neuromuscular re-education;Pain management;Patient/family education;Psychosocial support;Stair training;Therapeutic Activities;Therapeutic Exercise;UE/LE Strength taining/ROM;UE/LE Coordination activities PT Transfers Anticipated Outcome(s): Mod I PT Locomotion Anticipated Outcome(s): Mod I with RW PT Recommendation Follow Up Recommendations: Outpatient PT Patient destination: Home Equipment Recommended: Rolling walker with 5" wheels(pt owns RW) Equipment Details: pt owns RW if needed  Skilled Therapeutic Intervention Evaluation completed (see details above and below) with education on PT POC and goals and individual treatment initiated with focus on functional transfer and gait assessment. Per OT and RN pt has had instances of dizziness and low BP this date. Supine BP 85/59. Seated BP 93/57. Standing BP 103/62. Pt remains asymptomatic in standing. Bed mobility Supervision. Pt transfers with min A and RW throughout therapy session. Ambulation x 150 ft with RW and min A. Car transfer min A. Ascend/descend 4 stairs with 2 handrails and min A, lateral ascent/descent with one handrail and min A. Berg Balance Test 12/56, high fall risk. Education with patient and patient's wife about fall risk and need to use RW and call for assistance at this time to get up out of bed. Pt left supine in bed with needs in reach, bed alarm in place, wife present.  PT Evaluation Precautions/Restrictions Precautions Precautions: Fall Restrictions Weight Bearing Restrictions: No General   Vital SignsTherapy Vitals Temp: 98 F (36.7 C) Temp Source:  Oral Pulse Rate: 79 Resp: 18 BP: 110/74 Patient Position (if appropriate): Lying Oxygen Therapy SpO2: 100 % O2 Device: Room Air Home Living/Prior Functioning Home Living Available Help at Discharge: Family;Available 24 hours/day Type of Home: House Home Access: Stairs to enter [employer] of Steps: 3 Entrance Stairs-Rails: Left Home Layout: Two level;Bed/bath upstairs Alternate Level Stairs-Number of Steps: 13 Alternate Level Stairs-Rails: Right  Lives With: Spouse Prior Function Level of Independence: Independent with gait;Independent with transfers  Able to Take Stairs?: Yes Driving: Yes Vocation: Retired Comments: active, helps around the house, [employer] and goes for walks Vision/Perception  Vision - History Baseline Vision: Other (comment)(wears contacts) Perception Perception: Within Functional Limits Praxis Praxis: Intact  Cognition Overall Cognitive Status: Within Functional Limits for tasks assessed Arousal/Alertness: Awake/alert Orientation Level: Oriented X4 Memory: Appears intact Awareness: Appears intact Problem Solving: Appears intact Safety/Judgment: Appears intact Sensation Sensation Light Touch: Impaired by gross assessment(impaired in fingers; impaired in B medial and bottom of foot) Proprioception: Impaired by gross assessment(impaired in BLE) Coordination Gross Motor Movements are Fluid and Coordinated: No Fine Motor Movements are Fluid and Coordinated: No Coordination and Movement Description: impaired by generalized weakness and sensory deficits Heel Shin Test: impaired B due to limited hip ROM Motor  Motor Motor: Within Functional Limits  Mobility Bed Mobility Bed Mobility: Supine to Sit;Sit to Supine Supine to Sit: Supervision/Verbal cueing Sit to Supine: Supervision/Verbal cueing Transfers Transfers: Stand Pivot Transfers;Sit to Stand Sit to Stand: Minimal Assistance - Patient > 75% Stand Pivot Transfers: Minimal Assistance -  Patient > 75% Stand Pivot Transfer Details: Verbal cues for technique;Verbal cues for precautions/safety Transfer (Assistive device): Rolling walker Locomotion  Gait Gait Distance (Feet): 150 Feet Assistive device: Rolling walker Gait Gait Pattern: Impaired(decreased step length B) Gait velocity: decreased Stairs / Additional Locomotion Stairs: Yes Stairs Assistance: Minimal Assistance -  Patient > 75% Stair Management Technique: Two rails;Step to pattern Number of Stairs: 4 Height of Stairs: 6 Wheelchair Mobility Wheelchair Mobility: No  Trunk/Postural Assessment  Cervical Assessment Cervical Assessment: Within Functional Limits Thoracic Assessment Thoracic Assessment: Within Functional Limits Lumbar Assessment Lumbar Assessment: Within Functional Limits Postural Control Postural Control: Within Functional Limits  Balance Balance Balance Assessed: Yes Standardized Balance Assessment Standardized Balance Assessment: Berg Balance Test Berg Balance Test Sit to Stand: Needs minimal aid to stand or to stabilize Standing Unsupported: Able to stand 2 minutes with supervision Sitting with Back Unsupported but Feet Supported on Floor or Stool: Able to sit safely and securely 2 minutes Stand to Sit: Needs assistance to sit Transfers: Needs one person to assist Standing Unsupported with Eyes Closed: Needs help to keep from falling Standing Ubsupported with Feet Together: Needs help to attain position but able to stand for 30 seconds with feet together From Standing, Reach Forward with Outstretched Arm: Loses balance while trying/requires external support From Standing Position, Pick up Object from Floor: Unable to try/needs assist to keep balance From Standing Position, Turn to Look Behind Over each Shoulder: Needs assist to keep from losing balance and falling Turn 360 Degrees: Needs assistance while turning Standing Unsupported, Alternately Place Feet on Step/Stool: Able to  complete >2 steps/needs minimal assist Standing Unsupported, One Foot in Front: Needs help to step but can hold 15 seconds Standing on One Leg: Unable to try or needs assist to prevent fall Total Score: 12 Static Sitting Balance Static Sitting - Balance Support: No upper extremity supported;Feet supported Static Sitting - Level of Assistance: 5: Stand by assistance Dynamic Sitting Balance Dynamic Sitting - Balance Support: No upper extremity supported;Feet supported Dynamic Sitting - Level of Assistance: 5: Stand by assistance Static Standing Balance Static Standing - Balance Support: Bilateral upper extremity supported;During functional activity Static Standing - Level of Assistance: 4: Min assist Dynamic Standing Balance Dynamic Standing - Balance Support: Bilateral upper extremity supported;During functional activity Dynamic Standing - Level of Assistance: 4: Min assist Extremity Assessment   RLE Assessment RLE Assessment: Within Functional Limits Active Range of Motion (AROM) Comments: HS tightness General Strength Comments: 4/5 grossly LLE Assessment LLE Assessment: Within Functional Limits Active Range of Motion (AROM) Comments: HS tightness General Strength Comments: 4/5 grossly   See Function Navigator for Current Functional Status.   Refer to Care Plan for Long Term Goals  Recommendations for other services: None   Discharge Criteria: Patient will be discharged from PT if patient refuses treatment 3 consecutive times without medical reason, if treatment goals not met, if there is a change in medical status, if patient makes no progress towards goals or if patient is discharged from hospital.  The above assessment, treatment plan, treatment alternatives and goals were discussed and mutually agreed upon: by patient and by family  Excell Seltzer, PT, DPT  02/10/2018, 3:38 PM

## 2018-02-11 ENCOUNTER — Inpatient Hospital Stay (HOSPITAL_COMMUNITY): Payer: Medicare Other | Admitting: Occupational Therapy

## 2018-02-11 ENCOUNTER — Inpatient Hospital Stay (HOSPITAL_COMMUNITY): Payer: Medicare Other | Admitting: Physical Therapy

## 2018-02-11 DIAGNOSIS — G610 Guillain-Barre syndrome: Secondary | ICD-10-CM

## 2018-02-11 DIAGNOSIS — I10 Essential (primary) hypertension: Secondary | ICD-10-CM

## 2018-02-11 DIAGNOSIS — R7989 Other specified abnormal findings of blood chemistry: Secondary | ICD-10-CM

## 2018-02-11 LAB — BASIC METABOLIC PANEL
ANION GAP: 7 (ref 5–15)
BUN: 47 mg/dL — ABNORMAL HIGH (ref 8–23)
CALCIUM: 9.5 mg/dL (ref 8.9–10.3)
CO2: 23 mmol/L (ref 22–32)
Chloride: 103 mmol/L (ref 98–111)
Creatinine, Ser: 1.23 mg/dL (ref 0.61–1.24)
GFR calc Af Amer: >60 mL/min (ref >60)
GFR calc non Af Amer: 57 mL/min — ABNORMAL LOW (ref >60)
Glucose, Bld: 100 mg/dL — ABNORMAL HIGH (ref 70–99)
Potassium: 4.8 mmol/L (ref 3.5–5.1)
Sodium: 133 mmol/L — ABNORMAL LOW (ref 135–145)

## 2018-02-11 MED ORDER — SODIUM CHLORIDE 0.45 % IV SOLN
INTRAVENOUS | Status: DC
  Administered 2018-02-11 (×2): via INTRAVENOUS

## 2018-02-11 NOTE — Progress Notes (Signed)
Occupational Therapy Session Note  Patient Details  Name: Henry Morrison MRN: [id number] Date of Birth: 08-22-1945  Today's Date: 02/11/2018 OT Individual Time: 1301-1402 OT Individual Time Calculation (min): 61 min    Short Term Goals: Week 1:  OT Short Term Goal 1 (Week 1): STGs = LTGs  Skilled Therapeutic Interventions/Progress Updates:    Pt completed functional mobility with min assist down to the [place] elevators without assistive device.  After a brief rest he was able to finish ambulation to the day room with transfer to the [place].  He completed 3 sets with resistance on level 6.  First set completed using all 4 extremities with average number of steps at 60 and time of 5 mins.  Second and third sets were 1 min 20 seconds and 1 min 40 seconds with isolated use of the LEs.  He was only able to tolerate the shorter intervals secondary to fatigue.  HR increased to 115 with each set.  Next had pt transfer over for use of the Wii balance board.  He needed min assist for stepping on and off of the board.  During activity he demonstrated increased weight shift posteriorly, exhibiting difficulty with anterior weightshift.  He would tend to flex his knees to try and compensate to bring his weight forward instead of using the correct ankle strategies.  Min assist needed to complete without knee flexion compensation.  Finished session with ambulation back to the room with use of the RW and without resting.  Pt left in the bed with spouse present in room as well.   Therapy Documentation Precautions:  Precautions Precautions: Fall Restrictions Weight Bearing Restrictions: No  Pain: Pain Assessment Pain Score: 0-No pain ADL: See Function Navigator for Current Functional Status.   Therapy/Group: Individual Therapy  Kadesha Virrueta OTR/L 02/11/2018, 3:44 PM

## 2018-02-11 NOTE — Plan of Care (Signed)
  Problem: Consults Goal: RH GENERAL PATIENT EDUCATION Description See Patient Education module for education specifics. Outcome: Progressing Goal: RH GENERAL PATIENT EDUCATION Description See Patient Education module for education specifics. Outcome: Progressing   Problem: RH SKIN INTEGRITY Goal: RH STG SKIN FREE OF INFECTION/BREAKDOWN Description No new breakdown with Mod I assist   Outcome: Progressing   Problem: RH SAFETY Goal: RH STG ADHERE TO SAFETY PRECAUTIONS W/ASSISTANCE/DEVICE Description STG Adhere to Safety Precautions With Supervision Assistance/Device.  Outcome: Progressing   Problem: RH PAIN MANAGEMENT Goal: RH STG PAIN MANAGED AT OR BELOW PT'S PAIN GOAL Description < 4 out of 10.   Outcome: Progressing

## 2018-02-11 NOTE — Progress Notes (Signed)
Pt refused continuous IV fluids stating that BP has been stable and he does not feel like he needs it any longer. Continue plan of care.  Henry Morrison [name]

## 2018-02-11 NOTE — Progress Notes (Signed)
Occupational Therapy Session Note  Patient Details  Name: Henry Morrison MRN: [id number] Date of Birth: [birth year]  Today's Date: 02/11/2018 OT Individual Time: 0902-1016 OT Individual Time Calculation (min): 74 min    Short Term Goals: Week 1:  OT Short Term Goal 1 (Week 1): STGs = LTGs  Skilled Therapeutic Interventions/Progress Updates:    Pt declined need for shower and had already brushed his teeth and washed off some before therapy.  Chose to go down to the therapy gym and work on balance and UE coordination and strength during session.  He was able to ambulate down to the therapy gym with supervision.  Once in the gym had pt work on BUE functional reach and coordination with use of the resistive clothespins in sitting.  He was able to pick up and place all clothespins with supervision with each UE.  Next had pt work on standing balance with feet in various positions.  He demonstrated LOB with standing and eyes closed.  He was able to position them close together and then back apart without LOB.  Had him reach down to the floor to pick up cones, which he was able to complete with min assist.  One significant LOB requiring min assist.  Had pt complete short distance functional mobility without assistive device and min assist.  Finished session with pt standing on foam surface with small head movements and min guard assist.  Pt ambulated back to his room with close supervision and opted to sit up in the bedside recliner with call button and phone in reach.    Therapy Documentation Precautions:  Precautions Precautions: Fall Restrictions Weight Bearing Restrictions: No  Pain: Pain Assessment Pain Scale: 0-10 Pain Score: 0-No pain ADL: See Function Navigator for Current Functional Status.   Therapy/Group: Individual Therapy  Aslan Montagna OTR/L 02/11/2018, 12:45 PM

## 2018-02-11 NOTE — Progress Notes (Addendum)
Subjective/Complaints: Feeling well this morning.  Had a spell where he became dizzy and started seeing spots after therapy yesterday afternoon.  Was placed in IV fluids.  ROS: Patient denies fever, rash, sore throat, blurred vision, nausea, vomiting, diarrhea, cough, shortness of breath or chest pain, joint or back pain, headache, or mood change.   Objective: Vital Signs: Blood pressure (!) 147/95, pulse 88, temperature 97.6 F (36.4 C), temperature source Oral, resp. rate 20, height 5' 11"  (1.803 m), weight 89.5 kg, SpO2 96 %. No results found. Results for orders placed or performed during the hospital encounter of 02/09/18 (from the past 72 hour(s))  CBC     Status: None   Collection Time: 02/09/18  2:41 PM  Result Value Ref Range   WBC 8.4 4.0 - 10.5 K/uL   RBC 4.68 4.22 - 5.81 MIL/uL   Hemoglobin 14.1 13.0 - 17.0 g/dL   HCT 42.8 39.0 - 52.0 %   MCV 91.5 78.0 - 100.0 fL   MCH 30.1 26.0 - 34.0 pg   MCHC 32.9 30.0 - 36.0 g/dL   RDW 14.2 11.5 - 15.5 %   Platelets 298 150 - 400 K/uL    Comment: Performed at [facility] [address] [place], [place] [address]  Creatinine, serum     Status: Abnormal   Collection Time: 02/09/18  2:41 PM  Result Value Ref Range   Creatinine, Ser 1.55 (H) 0.61 - 1.24 mg/dL   GFR calc non Af Amer 43 (L) >60 mL/min   GFR calc Af Amer 50 (L) >60 mL/min    Comment: (NOTE) The eGFR has been calculated using the CKD EPI equation. This calculation has not been validated in all clinical situations. eGFR's persistently <60 mL/min signify possible Chronic Kidney Disease. Performed at [facility], [place] [address] [place], [place] [address]   CBC WITH DIFFERENTIAL     Status: None   Collection Time: 02/10/18  5:46 AM  Result Value Ref Range   WBC 5.4 4.0 - 10.5 K/uL   RBC 4.64 4.22 - 5.81 MIL/uL   Hemoglobin 14.0 13.0 - 17.0 g/dL   HCT 42.4 39.0 - 52.0 %   MCV 91.4 78.0 - 100.0 fL   MCH 30.2 26.0 - 34.0 pg   MCHC 33.0 30.0 - 36.0  g/dL   RDW 13.9 11.5 - 15.5 %   Platelets 227 150 - 400 K/uL   Neutrophils Relative % 52 %   Neutro Abs 2.8 1.7 - 7.7 K/uL   Lymphocytes Relative 28 %   Lymphs Abs 1.5 0.7 - 4.0 K/uL   Monocytes Relative 18 %   Monocytes Absolute 1.0 0.1 - 1.0 K/uL   Eosinophils Relative 2 %   Eosinophils Absolute 0.1 0.0 - 0.7 K/uL   Basophils Relative 0 %   Basophils Absolute 0.0 0.0 - 0.1 K/uL   Immature Granulocytes 0 %   Abs Immature Granulocytes 0.0 0.0 - 0.1 K/uL    Comment: Performed at [facility], [address] [place], [place] [address]  Comprehensive metabolic panel     Status: Abnormal   Collection Time: 02/10/18  5:46 AM  Result Value Ref Range   Sodium 133 (L) 135 - 145 mmol/L   Potassium 5.0 3.5 - 5.1 mmol/L   Chloride 101 98 - 111 mmol/L   CO2 23 22 - 32 mmol/L   Glucose, Bld 97 70 - 99 mg/dL   BUN 50 (H) 8 - 23 mg/dL  Creatinine, Ser 1.53 (H) 0.61 - 1.24 mg/dL   Calcium 9.6 8.9 - 10.3 mg/dL   Total Protein 8.7 (H) 6.5 - 8.1 g/dL   Albumin 3.4 (L) 3.5 - 5.0 g/dL   AST 81 (H) 15 - 41 U/L   ALT 137 (H) 0 - 44 U/L   Alkaline Phosphatase 80 38 - 126 U/L   Total Bilirubin 1.1 0.3 - 1.2 mg/dL   GFR calc non Af Amer 44 (L) >60 mL/min   GFR calc Af Amer 51 (L) >60 mL/min    Comment: (NOTE) The eGFR has been calculated using the CKD EPI equation. This calculation has not been validated in all clinical situations. eGFR's persistently <60 mL/min signify possible Chronic Kidney Disease.    Anion gap 9 5 - 15    Comment: Performed at [facility] [address] [place], [place] [address]  Basic metabolic panel     Status: Abnormal   Collection Time: 02/11/18  6:04 AM  Result Value Ref Range   Sodium 133 (L) 135 - 145 mmol/L   Potassium 4.8 3.5 - 5.1 mmol/L   Chloride 103 98 - 111 mmol/L   CO2 23 22 - 32 mmol/L   Glucose, Bld 100 (H) 70 - 99 mg/dL   BUN 47 (H) 8 - 23 mg/dL   Creatinine, Ser 1.23 0.61 - 1.24 mg/dL   Calcium 9.5 8.9 - 10.3 mg/dL   GFR calc non  Af Amer 57 (L) >60 mL/min   GFR calc Af Amer >60 >60 mL/min    Comment: (NOTE) The eGFR has been calculated using the CKD EPI equation. This calculation has not been validated in all clinical situations. eGFR's persistently <60 mL/min signify possible Chronic Kidney Disease.    Anion gap 7 5 - 15    Comment: Performed at [facility] [address], [place], [place] [address]     Constitutional: No distress . Vital signs reviewed. HEENT: EOMI, oral membranes moist Neck: supple Cardiovascular: RRR without murmur. No JVD    Respiratory: CTA Bilaterally without wheezes or rales. Normal effort    GI: BS +, non-tender, non-distended  Skin:   Intact Neuro: Alert/Oriented, Cranial Nerve II-XII normal, Abnormal Sensory reduced LT and proprio bilateral feet below ankles along fingertips.  And Abnormal Motor 4- Bilateral ankle DF otherwise 5/5 Musc/Skel:  Normal and Other no pain with UE or LE ROM Psych: Pleasant   Assessment/Plan: 1. 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 Physiatrist is providing close team supervision and 24 hour management of active medical problems listed below. Physiatrist and rehab team continue to assess barriers to discharge/monitor patient progress toward functional and medical goals. FIM: Function - Bathing Position: Shower Body parts bathed by patient: Right arm, Left arm, Chest, Abdomen, Front perineal area, Buttocks, Left upper leg, Right upper leg, Right lower leg, Left lower leg, Back Assist Level: Set up  Function- Upper Body Dressing/Undressing What is the patient wearing?: Pull over shirt/dress Pull over shirt/dress - Perfomed by patient: Thread/unthread right sleeve, Thread/unthread left sleeve, Put head through opening, Pull shirt over trunk Assist Level: Set up Function - Lower Body Dressing/Undressing What is the patient  wearing?: Underwear, Pants, Socks, Shoes Position: Wheelchair/chair at [employer] - Performed by patient: Thread/unthread right underwear leg, Thread/unthread left underwear leg, Pull underwear up/down Pants- Performed by patient: Thread/unthread right pants leg, Thread/unthread left pants leg, Pull pants  up/down Socks - Performed by patient: Don/doff right sock, Don/doff left sock Shoes - Performed by patient: Don/doff right shoe, Don/doff left shoe Shoes - Performed by helper: Fasten right, Fasten left Assist for footwear: Partial/moderate assist Assist for lower body dressing: Supervision or verbal cues  Function - Toileting Toileting steps completed by patient: Adjust clothing prior to toileting, Performs perineal hygiene, Adjust clothing after toileting Assist level: Set up/obtain supplies  Function - Toilet Transfers Toilet transfer activity did not occur: Safety/medical concerns  Function - Chair/bed transfer Chair/bed transfer method: Ambulatory Chair/bed transfer assist level: Touching or steadying assistance (Pt > 75%) Chair/bed transfer assistive device: Walker Chair/bed transfer details: Verbal cues for precautions/safety, Verbal cues for safe use of DME/AE  Function - Locomotion: Wheelchair Will patient use wheelchair at discharge?: No Function - Locomotion: Ambulation Assistive device: Walker-rolling Max distance: 150' Assist level: Touching or steadying assistance (Pt > 75%) Assist level: Touching or steadying assistance (Pt > 75%) Assist level: Touching or steadying assistance (Pt > 75%) Assist level: Touching or steadying assistance (Pt > 75%) Walk 10 feet on uneven surfaces activity did not occur: Safety/medical concerns  Function - Comprehension Comprehension: Auditory Comprehension assist level: Follows complex conversation/direction with no assist  Function - Expression Expression: Verbal Expression assist level: Expresses complex ideas: With no  assist  Function - Social Interaction Social Interaction assist level: Interacts appropriately with others - No medications needed.  Function - Problem Solving Problem solving assist level: Solves complex problems: Recognizes & self-corrects  Function - Memory Memory assist level: Complete Independence: No helper Patient normally able to recall (first 3 days only): Current season, Location of own room, Staff names and faces, That he or she is in a hospital Medical Problem List and Plan: 1.Decreased functional mobilitysecondary to GBS. IVIG completed 02/07/2018  -Continue PT, OT 2. DVT Prophylaxis/Anticoagulation: Subcutaneous heparin. Check vascular study 3. Pain Management:Oxycodone/Ultram as needed 4. Mood:Provide emotional support 5. Neuropsych: This patientiscapable of making decisions on hisown behalf. 6. Skin/Wound Care:Routine skin checks 7. Fluids/Electrolytes/Nutrition:Routine in and outs with follow-up chemistries 8.Hypertension. Imdur 30 mg daily, Toprol-XL 25 mg nightly,norvasc 5 mg daily Vitals:   02/11/18 0555 02/11/18 0557  BP: (!) 156/98 (!) 147/95  Pulse: 75 88  Resp:    Temp:    SpO2:     -Blood pressure remains elevated.  Will increase amlodipine to 10 mg potentially tomorrow (not today given syncope) 9.CAD with history of stenting. No chest pain or shortness of breath 10.Hyperlipidemia. Lipitor/Zetia 11.  Constipation likely related to poor mobility, neurogenic bowel less likely start miralax 12.  Near syncopal episode: Likely related to prerenal azotemia.   -BUN down to 47 from 50 yesterday.  -Continue IV fluids for now and push p.o. Fluids.  -Recheck labs in the morning    LOS (Days) 2 A FACE TO FACE EVALUATION WAS PERFORMED  Meredith Staggers 02/11/2018, 7:38 AM

## 2018-02-11 NOTE — Progress Notes (Addendum)
Physical Therapy Session Note  Patient Details  Name: Henry Morrison MRN: [id number] Date of Birth: [birth year]  Today's Date: 02/11/2018 PT Individual Time: 1610-1710 PT Individual Time Calculation (min): 60 min   Short Term Goals: Week 1:  PT Short Term Goal 1 (Week 1): =LTG due to ELOS  Skilled Therapeutic Interventions/Progress Updates:   Pt in supine and agreeable to therapy, denies pain. Per pt request, therapist provided instruction on exercises he can do outside of therapy to work on global strength, endurance, and proprioception of all extremities. Instructed on performing exercises both at seated and supine level including supine bride w/ and w/o resistance band, supine heel slides, supine SLR, hooklying clamshells, seated knee marches, seated LAQs, PF/DF w/ resistance, and resisted shoulder ER, bicep curl, and supination/pronation. Pt enjoys golf, so added UE exercises per his request. Performed 1 set x 10 reps of all exercises w/ visual and tactile cues for correct form at both the extremity performing action and stabilization from trunk/core. Pt demo-ed all correctly and w/o pain. Educated on soreness vs pain after exercises and when to stop performing an exercise for safety. Discussed emphasizing slow, coordinated, and controlled movement of all exercises. Pt verbalized understanding and in agreement w/ all education. Provided written handout of exercises and education provided. Ended session in supine and in care of wife, all needs met.   Therapy Documentation Precautions:  Precautions Precautions: Fall Restrictions Weight Bearing Restrictions: No Vital Signs: Therapy Vitals Temp: 98.4 F (36.9 C) Temp Source: Oral Pulse Rate: 94 BP: 112/65 Patient Position (if appropriate): Lying Oxygen Therapy SpO2: 97 % O2 Device: Room Air Pain: Pain Assessment Pain Score: 0-No pain  See Function Navigator for Current Functional Status.   Therapy/Group: Individual Therapy  Aeon Kessner  Clent Demark 02/11/2018, 5:13 PM

## 2018-02-12 ENCOUNTER — Inpatient Hospital Stay (HOSPITAL_COMMUNITY): Payer: Medicare Other | Admitting: Physical Therapy

## 2018-02-12 LAB — BASIC METABOLIC PANEL
Anion gap: 7 (ref 5–15)
BUN: 33 mg/dL — AB (ref 8–23)
CALCIUM: 9.7 mg/dL (ref 8.9–10.3)
CO2: 25 mmol/L (ref 22–32)
Chloride: 102 mmol/L (ref 98–111)
Creatinine, Ser: 0.98 mg/dL (ref 0.61–1.24)
GFR calc Af Amer: >60 mL/min (ref >60)
GFR calc non Af Amer: >60 mL/min (ref >60)
GLUCOSE: 120 mg/dL — AB (ref 70–99)
Potassium: 4.3 mmol/L (ref 3.5–5.1)
Sodium: 134 mmol/L — ABNORMAL LOW (ref 135–145)

## 2018-02-12 MED ORDER — OXYMETAZOLINE HCL 0.05 % NA SOLN
1.0000 | Freq: Two times a day (BID) | NASAL | Status: DC
  Administered 2018-02-12 – 2018-02-15 (×5): 1 via NASAL
  Filled 2018-02-12: qty 15

## 2018-02-12 MED ORDER — AMLODIPINE BESYLATE 10 MG PO TABS
10.0000 mg | ORAL_TABLET | Freq: Every day | ORAL | Status: DC
  Administered 2018-02-13 – 2018-02-15 (×3): 10 mg via ORAL
  Filled 2018-02-12 (×3): qty 1

## 2018-02-12 NOTE — Plan of Care (Signed)
  Problem: Consults Goal: RH GENERAL PATIENT EDUCATION Description See Patient Education module for education specifics. Outcome: Progressing Goal: RH GENERAL PATIENT EDUCATION Description See Patient Education module for education specifics. Outcome: Progressing   Problem: RH SKIN INTEGRITY Goal: RH STG SKIN FREE OF INFECTION/BREAKDOWN Description No new breakdown with Mod I assist   Outcome: Progressing   Problem: RH SAFETY Goal: RH STG ADHERE TO SAFETY PRECAUTIONS W/ASSISTANCE/DEVICE Description STG Adhere to Safety Precautions With Supervision Assistance/Device.  Outcome: Progressing   Problem: RH PAIN MANAGEMENT Goal: RH STG PAIN MANAGED AT OR BELOW PT'S PAIN GOAL Description < 4 out of 10.   Outcome: Progressing

## 2018-02-12 NOTE — IPOC Note (Signed)
Overall Plan of Care [facility]) Patient Details Name: Henry Morrison MRN: [id number] DOB: [birth year]  Admitting Diagnosis: <principal problem not specified>  Hospital Problems: Active Problems:   GBS (Guillain Barre syndrome) ([place])     Functional Problem List: Nursing Endurance, Sensory, Pain, Motor  PT Balance, Endurance, Safety, Sensory  OT Motor, Balance, Sensory  SLP    TR         Basic ADL's: OT Grooming, Bathing, Dressing, Toileting     Advanced  ADL's: OT Simple Meal Preparation, Light Housekeeping     Transfers: PT Bed Mobility, Bed to Chair, Car, [employer], Futures trader, Tub/Shower     Locomotion: PT Ambulation, Stairs     Additional Impairments: OT None  SLP        TR      Anticipated Outcomes Item Anticipated Outcome  Self Feeding I  Swallowing      Basic self-care  mod I   Toileting  mod I   Bathroom Transfers mod I  Bowel/Bladder  Pt will manage bowel and bladder with mod I assist at discharge.   Transfers  Mod I  Locomotion  Mod I with RW  Communication     Cognition     Pain  Pt will manage pain at 3 or less on a scale of 0-10.   Safety/Judgment  Pt will remain free of falls with injury while in rehab with min assist/cues.    Therapy Plan: PT Intensity: Minimum of 1-2 x/day ,45 to 90 minutes PT Frequency: 5 out of 7 days PT Duration Estimated Length of Stay: 7 days OT Intensity: Minimum of 1-2 x/day, 45 to 90 minutes OT Frequency: 5 out of 7 days OT Duration/Estimated Length of Stay: 7 days      Team Interventions: Nursing Interventions Patient/Family Education, Pain Management, Disease Management/Prevention, Discharge Planning  PT interventions Ambulation/gait training, Training and development officer, Community reintegration, Discharge planning, DME/adaptive equipment instruction, Functional mobility training, Neuromuscular re-education, Pain management, Patient/family education, Psychosocial support, Stair training, Therapeutic  Activities, Therapeutic Exercise, UE/LE Strength taining/ROM, UE/LE Coordination activities  OT Interventions Balance/vestibular training, Discharge planning, DME/adaptive equipment instruction, Functional mobility training, Pain management, Patient/family education, Neuromuscular re-education, Self Care/advanced ADL retraining, Therapeutic Exercise, Therapeutic Activities, UE/LE Coordination activities, UE/LE Strength taining/ROM  SLP Interventions    TR Interventions    SW/CM Interventions Discharge Planning, Psychosocial Support, Patient/Family Education   Barriers to Discharge MD  Medical stability  Nursing      PT Medical stability    OT      SLP      SW       Team Discharge Planning: Destination: PT-Home ,OT- Home , SLP-  Projected Follow-up: PT-Outpatient PT, OT-  None, SLP-  Projected Equipment Needs: PT-Rolling walker with 5" wheels(pt owns RW), OT- None recommended by OT, SLP-  Equipment Details: PT-pt owns RW if needed, OT-  Patient/family involved in discharge planning: PT- Patient, Family member/caregiver,  OT-Patient, SLP-   MD ELOS: 7 days Medical Rehab Prognosis:  Excellent Assessment: The patient has been admitted for CIR therapies with the diagnosis of GBS. The team will be addressing functional mobility, strength, stamina, balance, safety, adaptive techniques and equipment, self-care, bowel and bladder mgt, patient and caregiver education, NMR, activity tolerance, community reentry, . Goals have been set at mod I for mobility and self-care.    Meredith Staggers, MD, FAAPMR      See Team Conference Notes for weekly updates to the plan of care

## 2018-02-12 NOTE — Progress Notes (Signed)
Physical Therapy Session Note  Patient Details  Name: Henry Morrison MRN: [id number] Date of Birth: Oct 17, 1945  Today's Date: 02/12/2018 PT Individual Time: 1445-1541 PT Individual Time Calculation (min): 56 min   Short Term Goals: Week 1:  PT Short Term Goal 1 (Week 1): =LTG due to ELOS  Skilled Therapeutic Interventions/Progress Updates:   Pt in supine and agreeable to therapy, denies pain. Session focused on NMR, balance, and LE proprioception. Pt ambulated around unit w/ and w/o AD. Supervision w/ RW, >150', and min assist w/o AD, 100-150'. Seated rest breaks in between bouts. Verbal cues for gait pattern. Performed dynamic standing balance tasks while performing bimanual reaching tasks emphasizing trunk rotation, both on firm and compliant surfaces. Worked on stepping strategies w/ stepping to alternating stepping to visual targets that varied in distance and height. Close supervision to min assist for all balance tasks. Performed 5x sit<>stand onto foam surface w/o UE push up, x2 bouts, and limits of stability training on biodex to work on ankle balance strategies and proprioception. Limits of stability training score 48% on beginner level, progressing to 56% on 2nd attempt. Maze limits of stability training score 76% on beginner level and 80% on intermediate. Returned to room and ended session in supine, call bell in reach and all needs met.    Therapy Documentation Precautions:  Precautions Precautions: Fall Restrictions Weight Bearing Restrictions: No  See Function Navigator for Current Functional Status.   Therapy/Group: Individual Therapy  [name] 02/12/2018, 3:42 PM

## 2018-02-12 NOTE — Progress Notes (Signed)
Subjective/Complaints: "had my best night of sleep since I told them to stop the IV". Wants something for sinus congestion  ROS: Patient denies fever, rash, sore throat, blurred vision, nausea, vomiting, diarrhea, cough, shortness of breath or chest pain, joint or back pain, headache, or mood change.    Objective: Vital Signs: Blood pressure (!) 141/96, pulse 81, temperature 97.9 F (36.6 C), temperature source Oral, resp. rate 18, height _0  (1.803 m), weight 89.5 kg, SpO2 97 %. No results found. Results for orders placed or performed during the hospital encounter of 02/09/18 (from the past 72 hour(s))  CBC     Status: None   Collection Time: 02/09/18  2:41 PM  Result Value Ref Range   WBC 8.4 4.0 - 10.5 K/uL   RBC 4.68 4.22 - 5.81 MIL/uL   Hemoglobin 14.1 13.0 - 17.0 g/dL   HCT 42.8 39.0 - 52.0 %   MCV 91.5 78.0 - 100.0 fL   MCH 30.1 26.0 - 34.0 pg   MCHC 32.9 30.0 - 36.0 g/dL   RDW 14.2 11.5 - 15.5 %   Platelets 298 150 - 400 K/uL    Comment: Performed at [facility] [address], [place], [place] [address]  Creatinine, serum     Status: Abnormal   Collection Time: 02/09/18  2:41 PM  Result Value Ref Range   Creatinine, Ser 1.55 (H) 0.61 - 1.24 mg/dL   GFR calc non Af Amer 43 (L) >60 mL/min   GFR calc Af Amer 50 (L) >60 mL/min    Comment: (NOTE) The eGFR has been calculated using the CKD EPI equation. This calculation has not been validated in all clinical situations. eGFR's persistently <60 mL/min signify possible Chronic Kidney Disease. Performed at [facility], [place] [address], [place], [place] [address]   CBC WITH DIFFERENTIAL     Status: None   Collection Time: 02/10/18  5:46 AM  Result Value Ref Range   WBC 5.4 4.0 - 10.5 K/uL   RBC 4.64 4.22 - 5.81 MIL/uL   Hemoglobin 14.0 13.0 - 17.0 g/dL   HCT 42.4 39.0 - 52.0 %   MCV 91.4 78.0 - 100.0 fL   MCH 30.2 26.0 - 34.0 pg   MCHC 33.0 30.0 - 36.0 g/dL   RDW 13.9 11.5 - 15.5 %   Platelets 227  150 - 400 K/uL   Neutrophils Relative % 52 %   Neutro Abs 2.8 1.7 - 7.7 K/uL   Lymphocytes Relative 28 %   Lymphs Abs 1.5 0.7 - 4.0 K/uL   Monocytes Relative 18 %   Monocytes Absolute 1.0 0.1 - 1.0 K/uL   Eosinophils Relative 2 %   Eosinophils Absolute 0.1 0.0 - 0.7 K/uL   Basophils Relative 0 %   Basophils Absolute 0.0 0.0 - 0.1 K/uL   Immature Granulocytes 0 %   Abs Immature Granulocytes 0.0 0.0 - 0.1 K/uL    Comment: Performed at [facility], [address], [place], [place] [address]  Comprehensive metabolic panel     Status: Abnormal   Collection Time: 02/10/18  5:46 AM  Result Value Ref Range   Sodium 133 (L) 135 - 145 mmol/L   Potassium 5.0 3.5 - 5.1 mmol/L   Chloride 101 98 - 111 mmol/L   CO2 23 22 - 32 mmol/L   Glucose, Bld 97 70 - 99 mg/dL   BUN 50 (H) 8 - 23 mg/dL   Creatinine, Ser 1.53 (H) 0.61 -  1.24 mg/dL   Calcium 9.6 8.9 - 10.3 mg/dL   Total Protein 8.7 (H) 6.5 - 8.1 g/dL   Albumin 3.4 (L) 3.5 - 5.0 g/dL   AST 81 (H) 15 - 41 U/L   ALT 137 (H) 0 - 44 U/L   Alkaline Phosphatase 80 38 - 126 U/L   Total Bilirubin 1.1 0.3 - 1.2 mg/dL   GFR calc non Af Amer 44 (L) >60 mL/min   GFR calc Af Amer 51 (L) >60 mL/min    Comment: (NOTE) The eGFR has been calculated using the CKD EPI equation. This calculation has not been validated in all clinical situations. eGFR's persistently <60 mL/min signify possible Chronic Kidney Disease.    Anion gap 9 5 - 15    Comment: Performed at [facility] [address], [place], [place] [address]  Basic metabolic panel     Status: Abnormal   Collection Time: 02/11/18  6:04 AM  Result Value Ref Range   Sodium 133 (L) 135 - 145 mmol/L   Potassium 4.8 3.5 - 5.1 mmol/L   Chloride 103 98 - 111 mmol/L   CO2 23 22 - 32 mmol/L   Glucose, Bld 100 (H) 70 - 99 mg/dL   BUN 47 (H) 8 - 23 mg/dL   Creatinine, Ser 1.23 0.61 - 1.24 mg/dL   Calcium 9.5 8.9 - 10.3 mg/dL   GFR calc non Af Amer 57 (L) >60 mL/min   GFR calc Af Amer  >60 >60 mL/min    Comment: (NOTE) The eGFR has been calculated using the CKD EPI equation. This calculation has not been validated in all clinical situations. eGFR's persistently <60 mL/min signify possible Chronic Kidney Disease.    Anion gap 7 5 - 15    Comment: Performed at [facility] [address] [place], [place] [address]     Constitutional: No distress . Vital signs reviewed. HEENT: EOMI, oral membranes moist Neck: supple Cardiovascular: RRR without murmur. No JVD    Respiratory: CTA Bilaterally without wheezes or rales. Normal effort    GI: BS +, non-tender, non-distended  Skin:   Intact Neuro: Alert/Oriented, Cranial Nerve II-XII normal, Abnormal Sensory reduced LT and proprio bilateral feet below ankles along fingertips.  And Abnormal Motor 4- Bilateral ankle DF otherwise 5/5 Musc/Skel:  Normal and Other no pain with UE or LE ROM Psych: Pleasant   Assessment/Plan: 1. 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 Physiatrist is providing close team supervision and 24 hour management of active medical problems listed below. Physiatrist and rehab team continue to assess barriers to discharge/monitor patient progress toward functional and medical goals. FIM: Function - Bathing Position: Shower Body parts bathed by patient: Right arm, Left arm, Chest, Abdomen, Front perineal area, Buttocks, Left upper leg, Right upper leg, Right lower leg, Left lower leg, Back Assist Level: Set up  Function- Upper Body Dressing/Undressing What is the patient wearing?: Pull over shirt/dress Pull over shirt/dress - Perfomed by patient: Thread/unthread right sleeve, Thread/unthread left sleeve, Put head through opening, Pull shirt over trunk Assist Level: Set up Function - Lower Body Dressing/Undressing What is the patient wearing?: Underwear, Pants, Socks,  Shoes Position: Wheelchair/chair at [employer] - Performed by patient: Thread/unthread right underwear leg, Thread/unthread left underwear leg, Pull underwear up/down Pants- Performed by patient: Thread/unthread right pants leg, Thread/unthread left pants leg, Pull pants up/down Socks - Performed by patient:  Don/doff right sock, Don/doff left sock Shoes - Performed by patient: Don/doff right shoe, Don/doff left shoe Shoes - Performed by helper: Fasten right, Fasten left Assist for footwear: Partial/moderate assist Assist for lower body dressing: Supervision or verbal cues  Function - Toileting Toileting steps completed by patient: Adjust clothing prior to toileting, Performs perineal hygiene, Adjust clothing after toileting Assist level: Set up/obtain supplies  Function - Toilet Transfers Toilet transfer activity did not occur: Safety/medical concerns  Function - Chair/bed transfer Chair/bed transfer method: Ambulatory Chair/bed transfer assist level: Supervision or verbal cues Chair/bed transfer assistive device: Walker Chair/bed transfer details: Verbal cues for precautions/safety, Verbal cues for safe use of DME/AE  Function - Locomotion: Wheelchair Will patient use wheelchair at discharge?: No Function - Locomotion: Ambulation Assistive device: Walker-rolling Max distance: 150' Assist level: Supervision or verbal cues Assist level: Touching or steadying assistance (Pt > 75%) Assist level: Touching or steadying assistance (Pt > 75%) Assist level: Touching or steadying assistance (Pt > 75%) Walk 10 feet on uneven surfaces activity did not occur: Safety/medical concerns  Function - Comprehension Comprehension: Auditory Comprehension assist level: Follows complex conversation/direction with no assist  Function - Expression Expression: Verbal Expression assist level: Expresses complex ideas: With no assist  Function - Social Interaction Social Interaction assist level:  Interacts appropriately with others - No medications needed.  Function - Problem Solving Problem solving assist level: Solves complex problems: Recognizes & self-corrects  Function - Memory Memory assist level: Complete Independence: No helper Patient normally able to recall (first 3 days only): Current season, Location of own room, Staff names and faces, That he or she is in a hospital Medical Problem List and Plan: 1.Decreased functional mobilitysecondary to GBS. IVIG completed 02/07/2018  -Continue PT, OT 2. DVT Prophylaxis/Anticoagulation: Subcutaneous heparin. Check vascular study 3. Pain Management:Oxycodone/Ultram as needed 4. Mood:Provide emotional support 5. Neuropsych: This patientiscapable of making decisions on hisown behalf. 6. Skin/Wound Care:Routine skin checks 7. Fluids/Electrolytes/Nutrition:Routine in and outs with follow-up chemistries 8.Hypertension. Imdur 30 mg daily, Toprol-XL 25 mg nightly,norvasc 5 mg daily Vitals:   02/12/18 0500 02/12/18 0502  BP: (!) 155/95 (!) 141/96  Pulse: 64 81  Resp:    Temp:    SpO2:     -Continued DBP elevation---increase norvasc to 37m 9.CAD with history of stenting. No chest pain or shortness of breath 10.Hyperlipidemia. Lipitor/Zetia 11.  Constipation likely related to poor mobility, neurogenic bowel less likely start miralax 12.  Near syncopal episode: Likely related to prerenal azotemia.   -BUN down to 47 on 9/28----today's labs pending.  -explained the rationale re: IVF. Will not resume until I see today's labs.    LOS (Days) 3 A FACE TO FACE EVALUATION WAS PERFORMED  ZMeredith Staggers9/29/2019, 7:27 AM

## 2018-02-13 ENCOUNTER — Inpatient Hospital Stay (HOSPITAL_COMMUNITY): Payer: Medicare Other | Admitting: Physical Therapy

## 2018-02-13 ENCOUNTER — Inpatient Hospital Stay (HOSPITAL_COMMUNITY): Payer: Medicare Other | Admitting: Occupational Therapy

## 2018-02-13 ENCOUNTER — Inpatient Hospital Stay (HOSPITAL_COMMUNITY): Payer: Medicare Other

## 2018-02-13 DIAGNOSIS — E871 Hypo-osmolality and hyponatremia: Secondary | ICD-10-CM

## 2018-02-13 DIAGNOSIS — R7989 Other specified abnormal findings of blood chemistry: Secondary | ICD-10-CM

## 2018-02-13 LAB — BASIC METABOLIC PANEL
Anion gap: 5 (ref 5–15)
BUN: 32 mg/dL — AB (ref 8–23)
CO2: 26 mmol/L (ref 22–32)
Calcium: 9.9 mg/dL (ref 8.9–10.3)
Chloride: 103 mmol/L (ref 98–111)
Creatinine, Ser: 1 mg/dL (ref 0.61–1.24)
GFR calc Af Amer: >60 mL/min (ref >60)
GLUCOSE: 95 mg/dL (ref 70–99)
POTASSIUM: 4.6 mmol/L (ref 3.5–5.1)
Sodium: 134 mmol/L — ABNORMAL LOW (ref 135–145)

## 2018-02-13 NOTE — Discharge Instructions (Signed)
Inpatient Rehab Discharge Instructions  GLENNIE RODDA Discharge date and time: No discharge date for patient encounter.   Activities/Precautions/ Functional Status: Activity: activity as tolerated Diet: regular diet Wound Care: none needed Functional status:  ___ No restrictions     ___ Walk up steps independently ___ 24/7 supervision/assistance   ___ Walk up steps with assistance ___ Intermittent supervision/assistance  ___ Bathe/dress independently ___ Walk with walker     _x__ Bathe/dress with assistance ___ Walk Independently    ___ Shower independently ___ Walk with assistance    ___ Shower with assistance ___ No alcohol     ___ Return to work/school ________  Special Instructions: No driving   COMMUNITY REFERRALS UPON DISCHARGE:    Outpatient: PT  Agency:[facility] [phone]   Date of Last Service:02/15/2018  Appointment Date/Time:OCTOBER 4 Friday 9:15-10:15 AM  Medical Equipment/Items Ordered:HAS ALL NEEDED EQUIPMENT FORM PAST SURGERIES    My questions have been answered and I understand these instructions. I will adhere to these goals and the provided educational materials after my discharge from the hospital.  Patient/Caregiver Signature _______________________________ Date __________  Clinician Signature _______________________________________ Date __________  Please bring this form and your medication list with you to all your follow-up doctor's appointments.

## 2018-02-13 NOTE — Progress Notes (Signed)
Social Work Patient ID: Henry Morrison, male   DOB: 02-12-1946, 72 y.o.   MRN: [id number] Pt feels he will be ready to go home on Wed and has informed the MD of this. Team feels he will need OP PT he prefers to go to OP Neuro have called to make referral. Has all needed equipment. Work toward [employer] discharge.

## 2018-02-13 NOTE — Progress Notes (Signed)
Physical Therapy Session Note  Patient Details  Name: Henry Morrison MRN: [id number] Date of Birth: 03/22/1946  Today's Date: 02/13/2018 PT Individual Time: 0800-0915 PT Individual Time Calculation (min): 75 min   Short Term Goals: Week 1:  PT Short Term Goal 1 (Week 1): =LTG due to ELOS  Skilled Therapeutic Interventions/Progress Updates: Pt received seated in w/c, denies pain and agreeable to treatment. Ambulates in/out of bathroom with RW and S; modI to void in standing. Gait to gym with RW and S. Ascent/descent 12 steps 1 handrail with S overall; one LOB with foot slipping off side of top step however pt recovers without assist, reports with limited sensation he has difficulty identifying placement of feet, compensates with vision. Progressive balance exercises with shoes removed, non-skid socks donned to maximize proprioceptive input. Performed normal BOS and feet together on level floor with head turns, mild instability noted however per pt much improved compared to past several days. Progressed to airex foam pad to reduce accuracy of somatosensory input; performed feet together with head turns. Standing on airex with dynamic reaching to retrieve playing cards from various heights, including from behind him on mat table and matching to board for focus on reduced visual input/increased somatosensory organization. Standing alternating toe taps to 6" step with min guard overall, occasional mild LOBs anteriorly. Level floor performed semi-tandem and tandem x1 min each, BLE. Gait in hall no AD with min guard, head turns and visual scanning to locate and identify playing cards held on side. FGA assessed as below with score indicating high risk for falls; educated pt on results. Returned to room gait with AD. Remained in bed, alarm intact and all needs in reach at end of session.      Therapy Documentation Precautions:  Precautions Precautions: Fall Restrictions Weight Bearing Restrictions: No   Functional Gait Assessment (FGA) Requirements: A marked 6-m (20-ft) walkway that is marked with a 30.48-cm (12-in) width.  _1_ 1. GAIT LEVEL SURFACE Instructions: Walk at your normal speed from here to the next mark (6 m[20 ft]). Grading: [place] the highest category that applies. (3) Normal-Walks 6 m (20 ft) in less than 5.5 seconds, no assistive devices, good speed, no evidence for imbalance, normal gait pattern, deviates no more than 15.24 cm (6 in) outside of the 30.48-cm (12-in) walkway width. (2) Mild impairment-Walks 6 m (20 ft) in less than 7 seconds but greater than 5.5 seconds, uses assistive device, slower speed, mild gait deviations, or deviates 15.24-25.4 cm (6-10 in) outside of the 30.48-cm (12-in) walkway width. (1) Moderate impairment-Walks 6 m (20 ft), slow speed, abnormal gait pattern, evidence for imbalance, or deviates 25.4-38.1 cm (10-15 in) outside of the 30.48-cm (12-in) walkway width. Requires more than 7 seconds to ambulate 6 m (20 ft). (0) Severe impairment-Cannot walk 6 m (20 ft) without assistance,severe gait deviations or imbalance, deviates greater than 38.1 cm (15 in) outside of the 30.48-cm (12-in) walkway width or reaches and touches the wall.  _2_ 2. CHANGE IN GAIT SPEED Instructions: Begin walking at your normal pace (for 1.5 m [5 ft]). When I tell you "go," walk as fast as you can (for 1.5 m [5 ft]). When I tell you "slow," walk as slowly as you can (for 1.5 m [5 ft]). Grading: [place] the highest category that applies. (3) Normal-Able to smoothly change walking speed without loss of balance or gait deviation. Shows a significant difference in walking speeds between normal, fast, and slow speeds. Deviates no more than 15.24 cm (6  in) outside of the 30.48-cm (12-in) walkway width. (2) Mild impairment-Is able to change speed but demonstrates mild gait deviations, deviates 15.24-25.4 cm (6-10 in) outside of the 30.48-cm (12-in) walkway width, or no gait deviations but  unable to achieve a significant change in velocity, or uses an assistive device. (1) Moderate impairment-Makes only minor adjustments to walking speed, or accomplishes a change in speed with significant gait deviations, deviates 25.4-38.1 cm (10-15 in) outside the 30.48-cm (12-in) walkway width, or changes speed but loses balance but is able to recover and continue walking. (0) Severe impairment-Cannot change speeds, deviates greater than 38.1 cm (15 in) outside 30.48-cm (12-in) walkway width, or loses balance and has to reach for wall or be caught.  _1_ 3. GAIT WITH HORIZONTAL HEAD TURNS Instructions: Walk from here to the next mark 6 m (20 ft) away. Begin walking at your normal pace. Keep walking straight; after 3 steps, turn your head to the right and keep walking straight while looking to the right. After 3 more steps, turn your head to the left and keep walking straight while looking left. Continue alternating looking right and left every 3 steps until you have completed 2 repetitions in each direction. Grading: [place] the highest category that applies. (3) Normal-Performs head turns smoothly with no change in gait. Deviates no more than 15.24 cm (6 in) outside 30.48-cm (12-in) walkway width. (2) Mild impairment-Performs head turns smoothly with slight change in gait velocity (eg, minor disruption to smooth gait path), deviates 15.24-25.4 cm (6-10 in) outside 30.48-cm (12-in) walkway width, or uses an assistive device.  (1) Moderate impairment-Performs head turns with moderate change in gait velocity, slows down, deviates 25.4-38.1 cm (10-15 in) outside 30.48-cm (12-in) walkway width but recovers, can continue to walk. (0) Severe impairment-Performs task with severe disruption of gait (eg, staggers 38.1 cm [15 in] outside 30.48-cm (12-in) walkway width, loses balance, stops, or reaches for wall).  _1_ 4. GAIT WITH VERTICAL HEAD TURNS Instructions: Walk from here to the next mark (6 m [20 ft]). Begin  walking at your normal pace. Keep walking straight; after 3 steps, tip your head up and keep walking straight while looking up. After 3 more steps, tip your head down, keep walking straight while looking down. Continue alternating looking up and down every 3 steps until you have completed 2 repetitions in each direction. Grading: [place] the highest category that applies. (3) Normal-Performs head turns with no change in gait. Deviates no more than 15.24 cm (6 in) outside 30.48-cm (12-in) walkway width. (2) Mild impairment-Performs task with slight change in gait velocity (eg, minor disruption to smooth gait path), deviates 15.24-25.4 cm (6-10 in) outside 30.48-cm (12-in) walkway width or uses assistive device. (1) Moderate impairment-Performs task with moderate change in gait velocity, slows down, deviates 25.4-38.1 cm (10-15 in) outside 30.48-cm (12-in) walkway width but recovers, can continue to walk. (0) Severe impairment-Performs task with severe disruption of gait (eg, staggers 38.1 cm [15 in] outside 30.48-cm (12-in) walkway width, loses balance, stops, reaches for wall).  _2_ 5. GAIT AND PIVOT TURN Instructions: Begin with walking at your normal pace. When I tell you, "turn and stop," turn as quickly as you can to face the opposite direction and stop. Grading: [place] the highest category that applies. (3) Normal-Pivot turns safely within 3 seconds and stops quickly with no loss of balance. (2) Mild impairment-Pivot turns safely in _3 seconds and stops with no loss of balance, or pivot turns safely within 3 seconds and stops with  mild imbalance, requires small steps to catch balance. (1) Moderate impairment-Turns slowly, requires verbal cueing, or requires several small steps to catch balance following turn and stop. (0) Severe impairment-Cannot turn safely, requires assistance to turn and stop.  _0_ 6. STEP OVER OBSTACLE Instructions: Begin walking at your normal speed. When you come to the shoe  box, step over it, not around it, and keep walking. Grading: [place] the highest category that applies. (3) Normal-Is able to step over 2 stacked shoe boxes taped together (22.86 cm [9 in] total height) without changing gait speed; no evidence of imbalance. (2) Mild impairment-Is able to step over one shoe box (11.43 cm [4.5 in] total height) without changing gait speed; no evidence of imbalance. (1) Moderate impairment-Is able to step over one shoe box (11.43 cm [4.5 in] total height) but must slow down and adjust steps to clear box safely. May require verbal cueing. (0) Severe impairment-Cannot perform without assistance.  _0_ 7. GAIT WITH NARROW BASE OF SUPPORT Instructions: Walk on the floor with arms folded across the chest, feet aligned heel to toe in tandem for a distance of 3.6 m [12 ft]. The number of steps taken in a straight line are counted for a maximum of 10 steps. Grading: [place] the highest category that applies. (3) Normal-Is able to ambulate for 10 steps heel to toe with no staggering. (2) Mild impairment-Ambulates 7-9 steps. (1) Moderate impairment-Ambulates 4-7 steps. (0) Severe impairment-Ambulates less than 4 steps heel to toe or cannot perform without assistance.  _1_ 8. GAIT WITH EYES CLOSED Instructions: Walk at your normal speed from here to the next mark (6 m [20 ft]) with your eyes closed. Grading: [place] the highest category that applies. (3) Normal-Walks 6 m (20 ft), no assistive devices, good speed, no evidence of imbalance, normal gait pattern, deviates no more than 15.24 cm (6 in) outside 30.48-cm (12-in) walkway width. Ambulates 6 m (20 ft) in less than 7 seconds. (2) Mild impairment-Walks 6 m (20 ft), uses assistive device, slower speed, mild gait deviations, deviates 15.24-25.4 cm (6-10 in) outside 30.48-cm (12-in) walkway width. Ambulates 6 m (20 ft) in less than 9 seconds but greater than 7 seconds. (1) Moderate impairment-Walks 6 m (20 ft), slow speed, abnormal  gait pattern, evidence for imbalance, deviates 25.4-38.1 cm (10-15 in) outside 30.48-cm (12-in) walkway width. Requires more than 9 seconds to ambulate 6 m (20 ft). (0) Severe impairment-Cannot walk 6 m (20 ft) without assistance, severe gait deviations or imbalance, deviates greater than 38.1 cm (15 in) outside 30.48-cm (12-in) walkway width or will not attempt task.  _2_ 9. AMBULATING BACKWARDS Instructions: Walk backwards until I tell you to stop. Grading: [place] the highest category that applies. (3) Normal-Walks 6 m (20 ft), no assistive devices, good speed, no evidence for imbalance, normal gait pattern, deviates no more than 15.24 cm (6 in) outside 30.48-cm (12-in) walkway width. (2) Mild impairment-Walks 6 m (20 ft), uses assistive device, slower speed, mild gait deviations, deviates 15.24-25.4 cm (6-10 in) outside 30.48-cm (12-in) walkway width. (1) Moderate impairment-Walks 6 m (20 ft), slow speed, abnormal gait pattern, evidence for imbalance, deviates 25.4-38.1 cm (10-15 in) outside 30.48-cm (12-in) walkway width. (0) Severe impairment-Cannot walk 6 m (20 ft) without assistance, severe gait deviations or imbalance, deviates greater than 38.1 cm (15 in) outside 30.48-cm (12-in) walkway width or will not attempt task.  _1_ 10. STEPS Instructions: Walk up these stairs as you would at home (ie, using the rail if necessary). At the  top turn around and walk down. Grading: [place] the highest category that applies. (3) Normal-Alternating feet, no rail. (2) Mild impairment-Alternating feet, must use rail. (1) Moderate impairment-Two feet to a stair; must use rail. (0) Severe impairment-Cannot do safely.  TOTAL SCORE: ___11___ /30 (MAXIMUM SCORE=30)  Scores of ? 22/30 on the FGA were found to be effective in predicting falls, Sensitivity 85%, Specificity 86% Scores of ? 20/30 on the FGA were optimal to predict older adults residing in community dwellings who would sustain unexplained falls in the  next 6 months, Sensitivity 100%, Specificity 76% ([place], 2010; aged 98 to 63, Older Adults) [place]: 4.2 points for CVA Augustin Coupe et al, 2010) MCID: 8 points for Balance and Vestibular Disorders Marjorie Smolder and Augustin Coupe, 2014)    See Function Navigator for Current Functional Status.   Therapy/Group: Individual Therapy  Corliss Skains 02/13/2018, 9:16 AM

## 2018-02-13 NOTE — Plan of Care (Signed)
  Problem: Consults Goal: RH GENERAL PATIENT EDUCATION Description See Patient Education module for education specifics. Outcome: Progressing Goal: RH GENERAL PATIENT EDUCATION Description See Patient Education module for education specifics. Outcome: Progressing   Problem: RH SKIN INTEGRITY Goal: RH STG SKIN FREE OF INFECTION/BREAKDOWN Description No new breakdown with Mod I assist   Outcome: Progressing   Problem: RH SAFETY Goal: RH STG ADHERE TO SAFETY PRECAUTIONS W/ASSISTANCE/DEVICE Description STG Adhere to Safety Precautions With Supervision Assistance/Device.  Outcome: Progressing Flowsheets (Taken 02/13/2018 1456) STG:Pt will adhere to safety precautions with assistance/device: 5-Supervision/set up   Problem: RH PAIN MANAGEMENT Goal: RH STG PAIN MANAGED AT OR BELOW PT'S PAIN GOAL Description < 4 out of 10.   Outcome: Progressing

## 2018-02-13 NOTE — Progress Notes (Signed)
Physical Therapy Session Note  Patient Details  Name: Henry Morrison MRN: [id number] Date of Birth: [birth year]  Today's Date: 02/13/2018 PT Individual Time: 8841-6606 PT Individual Time Calculation (min): 57 min   Short Term Goals: Week 1:  PT Short Term Goal 1 (Week 1): =LTG due to ELOS  Skilled Therapeutic Interventions/Progress Updates:    Pt supine in bed upon PT arrival, agreeable to therapy tx and denies pain. Pt transferred to sitting EOB with supervision and ambulated to the gym with CGA, no AD x 150 ft. 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 Pt worked on dynamic balance with eyes closed to perform marching in place, side stepping in each direction, and forwards/backwards ambulation. Pt worked on dynamic balance while ambulating and tossing/catching ball, min assist for balance. Pt worked on dynamic balance while ambulating and performing sudden stops, changes in gait speed and turn stops. Pt ascended/descended 12 steps with single handrail and supervision, step to pattern. Pt ambulated back to room and left seated EOB with wife present.   Therapy Documentation Precautions:  Precautions Precautions: Fall Restrictions Weight Bearing Restrictions: No   See Function Navigator for Current Functional Status.   Therapy/Group: Individual Therapy  Netta Corrigan, PT, DPT 02/13/2018, 1:00 PM

## 2018-02-13 NOTE — Progress Notes (Signed)
Subjective/Complaints: Patient seen sitting up in his chair this morning.  He states he slept well overnight.  He states he had a good weekend.  He states he is leaving Wednesday afternoon.  When I informed him I would check with the team regarding the recommendations, he states "this is not a request, I am leaving one way or the other".  He also states that he is only getting 3 hours of therapy here and at home he would be able to do much more.  ROS: Denies CP, SOB, N/V/D  Objective: Vital Signs: Blood pressure 119/72, pulse 89, temperature 98.6 F (37 C), temperature source Oral, resp. rate 18, height 5' 11"  (1.803 m), weight 89.5 kg, SpO2 96 %. No results found. Results for orders placed or performed during the hospital encounter of 02/09/18 (from the past 72 hour(s))  Basic metabolic panel     Status: Abnormal   Collection Time: 02/11/18  6:04 AM  Result Value Ref Range   Sodium 133 (L) 135 - 145 mmol/L   Potassium 4.8 3.5 - 5.1 mmol/L   Chloride 103 98 - 111 mmol/L   CO2 23 22 - 32 mmol/L   Glucose, Bld 100 (H) 70 - 99 mg/dL   BUN 47 (H) 8 - 23 mg/dL   Creatinine, Ser 1.23 0.61 - 1.24 mg/dL   Calcium 9.5 8.9 - 10.3 mg/dL   GFR calc non Af Amer 57 (L) >60 mL/min   GFR calc Af Amer >60 >60 mL/min    Comment: (NOTE) The eGFR has been calculated using the CKD EPI equation. This calculation has not been validated in all clinical situations. eGFR's persistently <60 mL/min signify possible Chronic Kidney Disease.    Anion gap 7 5 - 15    Comment: Performed at [facility] [address] [place], [place] [address]  Basic metabolic panel     Status: Abnormal   Collection Time: 02/12/18  8:18 AM  Result Value Ref Range   Sodium 134 (L) 135 - 145 mmol/L   Potassium 4.3 3.5 - 5.1 mmol/L   Chloride 102 98 - 111 mmol/L   CO2 25 22 - 32 mmol/L   Glucose, Bld 120 (H) 70 - 99 mg/dL   BUN 33 (H) 8 - 23 mg/dL   Creatinine, Ser 0.98 0.61 - 1.24 mg/dL   Calcium 9.7 8.9 - 10.3  mg/dL   GFR calc non Af Amer >60 >60 mL/min   GFR calc Af Amer >60 >60 mL/min    Comment: (NOTE) The eGFR has been calculated using the CKD EPI equation. This calculation has not been validated in all clinical situations. eGFR's persistently <60 mL/min signify possible Chronic Kidney Disease.    Anion gap 7 5 - 15    Comment: Performed at [facility] [address] [place], [place] [address]  Basic metabolic panel     Status: Abnormal   Collection Time: 02/13/18  6:25 AM  Result Value Ref Range   Sodium 134 (L) 135 - 145 mmol/L   Potassium 4.6 3.5 - 5.1 mmol/L   Chloride 103 98 - 111 mmol/L   CO2 26 22 - 32 mmol/L   Glucose, Bld 95 70 - 99 mg/dL   BUN 32 (H) 8 - 23 mg/dL   Creatinine, Ser 1.00 0.61 - 1.24 mg/dL   Calcium 9.9 8.9 - 10.3 mg/dL   GFR calc non Af Amer >60 >60 mL/min   GFR calc Af Amer >60 >60 mL/min  Comment: (NOTE) The eGFR has been calculated using the CKD EPI equation. This calculation has not been validated in all clinical situations. eGFR's persistently <60 mL/min signify possible Chronic Kidney Disease.    Anion gap 5 5 - 15    Comment: Performed at [facility] [address], [place], [place] [address]     Constitutional: No distress . Vital signs reviewed. HENT: Normocephalic.  Atraumatic. Eyes: EOMI. No discharge. Cardiovascular: RRR. No JVD. Respiratory: CTA Bilaterally. Normal effort. GI: BS +. Non-distended. Musc: No edema or tenderness in extremities. Neuro: Alert Motor 4/5 Bilateral ankle DF otherwise 5/5 Skin: Intact.  Warm and dry. Psych: Pleasant.  Normal mood.   Assessment/Plan: 1. 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 Physiatrist is providing close team supervision and 24 hour management of active medical problems listed below. Physiatrist and rehab team continue to assess barriers to  discharge/monitor patient progress toward functional and medical goals. FIM: Function - Bathing Position: Shower Body parts bathed by patient: Right arm, Left arm, Chest, Abdomen, Front perineal area, Buttocks, Left upper leg, Right upper leg, Right lower leg, Left lower leg, Back Assist Level: Set up  Function- Upper Body Dressing/Undressing What is the patient wearing?: Pull over shirt/dress Pull over shirt/dress - Perfomed by patient: Thread/unthread right sleeve, Thread/unthread left sleeve, Put head through opening, Pull shirt over trunk Assist Level: Set up Function - Lower Body Dressing/Undressing What is the patient wearing?: Underwear, Pants, Socks, Shoes Position: Wheelchair/chair at [employer] - Performed by patient: Thread/unthread right underwear leg, Thread/unthread left underwear leg, Pull underwear up/down Pants- Performed by patient: Thread/unthread right pants leg, Thread/unthread left pants leg, Pull pants up/down Socks - Performed by patient: Don/doff right sock, Don/doff left sock Shoes - Performed by patient: Don/doff right shoe, Don/doff left shoe Shoes - Performed by helper: Fasten right, Fasten left Assist for footwear: Partial/moderate assist Assist for lower body dressing: Supervision or verbal cues  Function - Toileting Toileting steps completed by patient: Adjust clothing prior to toileting, Performs perineal hygiene, Adjust clothing after toileting Assist level: Set up/obtain supplies  Function - Toilet Transfers Toilet transfer activity did not occur: Safety/medical concerns  Function - Chair/bed transfer Chair/bed transfer method: Ambulatory Chair/bed transfer assist level: Supervision or verbal cues Chair/bed transfer assistive device: Armrests, Walker Chair/bed transfer details: Verbal cues for precautions/safety, Verbal cues for safe use of DME/AE  Function - Locomotion: Wheelchair Will patient use wheelchair at discharge?: No Function -  Locomotion: Ambulation Assistive device: Walker-rolling Max distance: 150' Assist level: Supervision or verbal cues Assist level: Supervision or verbal cues Assist level: Supervision or verbal cues Assist level: Touching or steadying assistance (Pt > 75%) Walk 10 feet on uneven surfaces activity did not occur: Safety/medical concerns  Function - Comprehension Comprehension: Auditory Comprehension assist level: Follows complex conversation/direction with no assist  Function - Expression Expression: Verbal Expression assist level: Expresses complex ideas: With no assist  Function - Social Interaction Social Interaction assist level: Interacts appropriately with others - No medications needed.  Function - Problem Solving Problem solving assist level: Solves complex problems: Recognizes & self-corrects  Function - Memory Memory assist level: Complete Independence: No helper Patient normally able to recall (first 3 days only): Current season, Location of own room, Staff names and faces, That he or she is in a hospital Medical Problem List and Plan: 1.Decreased functional mobilitysecondary to GBS. IVIG completed 02/07/2018  Continue CIR  Notes reviewed- GBS after flu shot, images reviewed-CT head unremarkable for acute intracranial process, labs reviewed 2. DVT Prophylaxis/Anticoagulation: Subcutaneous heparin.  3. Pain Management:Oxycodone/Ultram as needed 4. Mood:Provide emotional support 5. Neuropsych: This patientiscapable of making decisions on hisown behalf. 6. Skin/Wound Care:Routine skin checks 7. Fluids/Electrolytes/Nutrition:Routine in and outs  8.Hypertension. Imdur 30 mg daily, Toprol-XL 25 mg nightly Vitals:   02/12/18 2011 02/13/18 0700  BP: 124/78 119/72  Pulse: 82 89  Resp: 18   Temp: 98.6 F (37 C)   SpO2: 96%    Increased norvasc to 35m  Controlled on 9/30 9.CAD with history of stenting. No chest pain or shortness of  breath 10.Hyperlipidemia. Lipitor/Zetia 11.  Constipation  Cont meds 12.  Near syncopal episode: Likely related to prerenal azotemia.   BUN/creatinine ratio remains elevated, however improved 13.  Hyponatremia  Sodium 134 on 9/30  Continue to monitor   LOS (Days) 4 A FACE TO FACE EVALUATION WAS PERFORMED  Ankit ALorie Phenix9/30/2019, 8:23 AM

## 2018-02-13 NOTE — Progress Notes (Signed)
Occupational Therapy Session Note  Patient Details  Name: Henry Morrison MRN: [id number] Date of Birth: [birth year]  Today's Date: 02/13/2018 OT Individual Time: 1107-1200 OT Individual Time Calculation (min): 53 min    Short Term Goals: Week 1:  OT Short Term Goal 1 (Week 1): STGs = LTGs  Skilled Therapeutic Interventions/Progress Updates:    Pt supine in bed upon entry with no reports of pain. Pt performed functional transfers and ambulation this session with and without RW, both req close supervision for safety with pt requiring min VC's for pacing with RW ambulation. Pt ambulated to apartment and performed bed mobility transfers on standard bed to simulate home environment- pt able to complete independently. In ADL kitchen, pt performed simple meal prep task including gathering items and reaching low and high while standing without AD with supervision throughout- seated rest break req at end of activity. Pt educated on energy conservation techniques including IADL and self care tasks such as rest breaks and sitting in stable chair, transporting items across kitchen, maintaining wide BOS for increased balance and safety during ambulation at home with RW. Pt then participated in golfing activity with putting green and required close supervision for picking up golf balls from floor with stability from golf club for balance.   Pt then ambulated to therapy and educated on various scenarios if he were to fall at home/who to call if assistance needed. Therapist demonstrated method of rolling to sidelying then transferring to quadreped from floor with pt able to return demonstrate with no VC's or physical assistance required. Pt then ambulated back to room and left in recliner with all needs in reach.   Education also provided regarding d/c planning, OP PT services, and walk in shower transfer with ledge- however pt declining practice of transfer due to already practicing in previous session. Therapy  Documentation Precautions:  Precautions Precautions: Fall Restrictions Weight Bearing Restrictions: No  See Function Navigator for Current Functional Status.   Therapy/Group: Individual Therapy  Mackenzi Krogh 02/13/2018, 12:09 PM

## 2018-02-14 ENCOUNTER — Inpatient Hospital Stay (HOSPITAL_COMMUNITY): Payer: Medicare Other | Admitting: Physical Therapy

## 2018-02-14 ENCOUNTER — Inpatient Hospital Stay (HOSPITAL_COMMUNITY): Payer: Medicare Other | Admitting: Occupational Therapy

## 2018-02-14 NOTE — Discharge Summary (Signed)
Discharge summary job [id number]

## 2018-02-14 NOTE — Progress Notes (Signed)
Social Work  Discharge Note  The overall goal for the admission was met for:   Discharge location: Yes-HOME WITH WIFE WHO CAN PROVIDE SUPERVISION LEVEL  Length of Stay: Yes-6 DAYS  Discharge activity level: Yes-INDEPENDENT   Home/community participation: Yes  Services provided included: MD, RD, PT, OT, RN, CM, Pharmacy and SW  Financial Services: Private Insurance: [employer]-MEDICARE  Follow-up services arranged: Outpatient: [facility] 10/4 9:15-10:15 AM  Comments (or additional information):WIFE WAS HERE FOR EDUCATION AND BOTH FEEL COMFORTABLE WITH DC TODAY. PT IS VERY PLEASED WITH HIS PROGRESS.  Patient/Family verbalized understanding of follow-up arrangements: Yes  Individual responsible for coordination of the follow-up plan: SELF & FRANCES-WIFE  Confirmed correct DME delivered: Henry Morrison, Henry Morrison 02/14/2018    Henry Morrison, Henry Morrison 

## 2018-02-14 NOTE — Progress Notes (Signed)
Subjective/Complaints: Patient seen sitting up in his chair, eating breakfast with family.  He states he is enjoying the outside food.  He has questions about discharge tomorrow.   ROS: Denies CP, SOB, N/V/D  Objective: Vital Signs: Blood pressure (!) 149/82, pulse 67, temperature 98.2 F (36.8 C), temperature source Oral, resp. rate 18, height _0  (1.803 m), weight 89.5 kg, SpO2 98 %. No results found. Results for orders placed or performed during the hospital encounter of 02/09/18 (from the past 72 hour(s))  Basic metabolic panel     Status: Abnormal   Collection Time: 02/12/18  8:18 AM  Result Value Ref Range   Sodium 134 (L) 135 - 145 mmol/L   Potassium 4.3 3.5 - 5.1 mmol/L   Chloride 102 98 - 111 mmol/L   CO2 25 22 - 32 mmol/L   Glucose, Bld 120 (H) 70 - 99 mg/dL   BUN 33 (H) 8 - 23 mg/dL   Creatinine, Ser 0.98 0.61 - 1.24 mg/dL   Calcium 9.7 8.9 - 10.3 mg/dL   GFR calc non Af Amer >60 >60 mL/min   GFR calc Af Amer >60 >60 mL/min    Comment: (NOTE) The eGFR has been calculated using the CKD EPI equation. This calculation has not been validated in all clinical situations. eGFR's persistently <60 mL/min signify possible Chronic Kidney Disease.    Anion gap 7 5 - 15    Comment: Performed at [facility] [address] [place], [place] [address]  Basic metabolic panel     Status: Abnormal   Collection Time: 02/13/18  6:25 AM  Result Value Ref Range   Sodium 134 (L) 135 - 145 mmol/L   Potassium 4.6 3.5 - 5.1 mmol/L   Chloride 103 98 - 111 mmol/L   CO2 26 22 - 32 mmol/L   Glucose, Bld 95 70 - 99 mg/dL   BUN 32 (H) 8 - 23 mg/dL   Creatinine, Ser 1.00 0.61 - 1.24 mg/dL   Calcium 9.9 8.9 - 10.3 mg/dL   GFR calc non Af Amer >60 >60 mL/min   GFR calc Af Amer >60 >60 mL/min    Comment: (NOTE) The eGFR has been calculated using the CKD EPI equation. This calculation has not been validated in all clinical situations. eGFR's persistently <60 mL/min signify possible  Chronic Kidney Disease.    Anion gap 5 5 - 15    Comment: Performed at [facility] [address], [place], [place] [address]     Constitutional: No distress . Vital signs reviewed. HENT: Normocephalic.  Atraumatic. Eyes: EOMI. No discharge. Cardiovascular: RRR. No JVD. Respiratory: CTA bilaterally.  Normal effort. GI: BS +. Non-distended. Musc: No edema or tenderness in extremities. Neuro: Alert Motor 4+/5 Bilateral ankle DF otherwise 5/5 Skin: Intact.  Warm and dry. Psych: Pleasant.  Normal mood.   Assessment/Plan: 1. 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 Physiatrist is providing close team supervision and 24 hour management of active medical problems listed below. Physiatrist and rehab team continue to assess barriers to discharge/monitor patient progress toward functional and medical goals. FIM: Function - Bathing Position: Shower Body parts bathed by patient: Right arm, Left arm, Chest, Abdomen, Front perineal area, Buttocks, Left upper leg, Right upper leg, Right lower leg, Left lower leg, Back Assist Level: Set up  Function- Upper Body Dressing/Undressing What is the patient wearing?: Pull over shirt/dress Pull  over shirt/dress - Perfomed by patient: Thread/unthread right sleeve, Thread/unthread left sleeve, Put head through opening, Pull shirt over trunk Assist Level: Set up Function - Lower Body Dressing/Undressing What is the patient wearing?: Underwear, Pants, Socks, Shoes Position: Wheelchair/chair at [employer] - Performed by patient: Thread/unthread right underwear leg, Thread/unthread left underwear leg, Pull underwear up/down Pants- Performed by patient: Thread/unthread right pants leg, Thread/unthread left pants leg, Pull pants up/down Socks - Performed by patient: Don/doff right sock, Don/doff left sock Shoes - Performed by  patient: Don/doff right shoe, Don/doff left shoe Shoes - Performed by helper: Fasten right, Fasten left Assist for footwear: Partial/moderate assist Assist for lower body dressing: Supervision or verbal cues  Function - Toileting Toileting steps completed by patient: Adjust clothing prior to toileting, Performs perineal hygiene, Adjust clothing after toileting Assist level: Set up/obtain supplies  Function - Toilet Transfers Toilet transfer activity did not occur: Safety/medical concerns Toilet transfer assistive device: Walker Assist level to toilet: Supervision or verbal cues Assist level from toilet: Supervision or verbal cues  Function - Chair/bed transfer Chair/bed transfer method: Ambulatory Chair/bed transfer assist level: Touching or steadying assistance (Pt > 75%) Chair/bed transfer assistive device: Armrests Chair/bed transfer details: Verbal cues for precautions/safety, Verbal cues for safe use of DME/AE  Function - Locomotion: Wheelchair Will patient use wheelchair at discharge?: No Function - Locomotion: Ambulation Assistive device: No device Max distance: 150 Assist level: Touching or steadying assistance (Pt > 75%) Assist level: Touching or steadying assistance (Pt > 75%) Assist level: Touching or steadying assistance (Pt > 75%) Assist level: Touching or steadying assistance (Pt > 75%) Walk 10 feet on uneven surfaces activity did not occur: Safety/medical concerns  Function - Comprehension Comprehension: Auditory Comprehension assist level: Follows complex conversation/direction with no assist  Function - Expression Expression: Verbal Expression assist level: Expresses complex ideas: With no assist  Function - Social Interaction Social Interaction assist level: Interacts appropriately with others - No medications needed.  Function - Problem Solving Problem solving assist level: Solves complex problems: Recognizes & self-corrects  Function - Memory Memory  assist level: Complete Independence: No helper Patient normally able to recall (first 3 days only): Current season, Location of own room, Staff names and faces, That he or she is in a hospital Medical Problem List and Plan: 1.Decreased functional mobilitysecondary to GBS. IVIG completed 02/07/2018  Continue CIR  Plan for d/c tomorrow  Will see patient for transitional care management in 1-2 weeks post-discharge 2. DVT Prophylaxis/Anticoagulation: Subcutaneous heparin.  3. Pain Management:Oxycodone/Ultram as needed 4. Mood:Provide emotional support 5. Neuropsych: This patientiscapable of making decisions on hisown behalf. 6. Skin/Wound Care:Routine skin checks 7. Fluids/Electrolytes/Nutrition:Routine in and outs  8.Hypertension. Imdur 30 mg daily, Toprol-XL 25 mg nightly Vitals:   02/13/18 1954 02/14/18 0516  BP: 127/85 (!) 149/82  Pulse: 89 67  Resp: 17 18  Temp: 98.9 F (37.2 C) 98.2 F (36.8 C)  SpO2: 96% 98%   Increased norvasc to 43m  Slightly labile on 10/1 9.CAD with history of stenting. No chest pain or shortness of breath 10.Hyperlipidemia. Lipitor/Zetia 11.  Constipation  Cont meds 12.  Near syncopal episode: Likely related to prerenal azotemia.   BUN/creatinine ratio remains elevated, however improved 13.  Hyponatremia  Sodium 134 on 9/30  Continue to monitor   LOS (Days) 5 A FACE TO FACE EVALUATION WAS PERFORMED  Ankit ALorie Phenix10/05/2017, 8:14 AM

## 2018-02-14 NOTE — Progress Notes (Signed)
Physical Therapy Discharge Summary  Patient Details  Name: Henry Morrison MRN: [id number] Date of Birth: June 04, 1945  Today's Date: 02/14/2018 PT Individual Time: 1100-1200 PT Individual Time Calculation (min): 60 min    Patient has met 6 of 6 long term goals due to improved activity tolerance, improved balance, improved postural control, increased strength, functional use of  right lower extremity and left lower extremity and improved coordination.  Patient to discharge at an ambulatory level Modified Independent with RW.   Patient's care partner is independent to provide the necessary physical assistance at discharge.  Reasons goals not met: All goals met.  Recommendation:  Patient will benefit from ongoing skilled PT services in outpatient setting to continue to advance safe functional mobility, address ongoing impairments in endurance, strength, balance, propioception, and minimize fall risk.  Equipment: No equipment provided. Pt has RW at home.  Reasons for discharge: treatment goals met and discharge from hospital  Patient/family agrees with progress made and goals achieved: Yes  PT Discharge Precautions/Restrictions Precautions Precautions: Fall Restrictions Weight Bearing Restrictions: No Pain Pain Assessment Pain Type: ("bothersome" "sore" ) Pain Location: Back(low back) Pain Orientation: Mid Pain Descriptors / Indicators: Aching Pain Onset: Unable to tell Pain Intervention(s): Distraction;Therapeutic touch;Other (Comment)(Manual Therapy (G4 mobs at L4)) Vision/Perception  Perception Perception: Within Functional Limits Praxis Praxis: Intact  Cognition Overall Cognitive Status: Within Functional Limits for tasks assessed Arousal/Alertness: Awake/alert Orientation Level: Oriented X4 Memory: Appears intact Awareness: Appears intact Problem Solving: Appears intact Safety/Judgment: Appears intact Sensation Sensation Light Touch: Impaired Detail Light Touch  Impaired Details: Impaired RUE;Impaired LUE;Impaired RLE;Impaired LLE(reports tingling sensation in B fingertips) Proprioception: Impaired Detail Proprioception Impaired Details: Impaired LLE;Impaired RLE Coordination Gross Motor Movements are Fluid and Coordinated: Yes Fine Motor Movements are Fluid and Coordinated: Yes Motor  Motor Motor: Within Functional Limits  Mobility Bed Mobility Bed Mobility: Rolling Right;Rolling Left;Left Sidelying to Sit;Right Sidelying to Sit;Supine to Sit;Sit to Supine;Sitting - Scoot to Edge of Bed Rolling Right: Independent Rolling Left: Independent Right Sidelying to Sit: Independent Left Sidelying to Sit: Independent Supine to Sit: Independent Sitting - Scoot to Edge of Bed: Independent Sit to Supine: Independent Transfers Sit to Stand: Independent with assistive device Stand Pivot Transfers: Independent with assistive device Transfer (Assistive device): Rolling walker Locomotion  Gait Ambulation: Yes Gait Assistance: Independent with assistive device Gait Distance (Feet): 250 Feet Assistive device: Rolling walker Gait Gait: Yes Gait Pattern: Impaired Gait Pattern: Decreased stride length Stairs / Additional Locomotion Stairs: Yes Stairs Assistance: Independent with assistive device Stair Management Technique: One rail Right Number of Stairs: 36 Height of Stairs: 6 Ramp: Independent with assistive device Curb: Independent with assistive device Wheelchair Mobility Wheelchair Mobility: No  Trunk/Postural Assessment  Cervical Assessment Cervical Assessment: Within Functional Limits Thoracic Assessment Thoracic Assessment: Within Functional Limits Lumbar Assessment Lumbar Assessment: Within Functional Limits Postural Control Postural Control: Within Functional Limits  Balance: Balance Balance Assessed: Yes Standardized Balance Assessment Standardized Balance Assessment: Biodex Testing Static Sitting Balance Static Sitting -  Balance Support: No upper extremity supported;Feet supported Static Sitting - Level of Assistance: 7: Independent Dynamic Sitting Balance Dynamic Sitting - Balance Support: No upper extremity supported;Feet supported Dynamic Sitting - Level of Assistance: 7: Independent Static Standing Balance Static Standing - Balance Support: During functional activity;No upper extremity supported Static Standing - Level of Assistance: 7: Independent Static Standing - Comment/# of Minutes:   Dynamic Standing Balance Dynamic Standing - Balance Support: During functional activity;No upper extremity supported Dynamic Standing - Level of Assistance: 6:  Modified independent (Device/Increase time) Dynamic Standing - Balance Activities: Lateral lean/weight shifting;Forward lean/weight shifting;Reaching for objects;[place];Reaching across midline;Compliant surfaces;Biodex Extremity Assessment  RUE Assessment RUE Assessment: Within Functional Limits LUE Assessment LUE Assessment: Within Functional Limits RLE Assessment RLE Assessment: Within Functional Limits LLE Assessment LLE Assessment: Within Functional Limits  Skilled Therapeutic Interventions Session 1: Pt received seated in recliner and agreeable to PT. Pt ambulated independently with RW to/from rehab gym. 2x15 forward and lateral eccentric step downs on 3 inch step with S on each leg for LE strengthening. Grade 4 PA mobs at L4/5 and Grade 4 distraction at sacrum with pt prone due to complaints of LBP and hypomobile segment. 2x15 squats on airex for LE strengthening and dynamic balance. CGA dynamic standing balance on airex with ball tosses at varying heights, speeds, and within/out of BOS; progressed to standing on 2 airex pads; Increased instability noted with 2 airex pads. Pt returned to room with ModI ambulation and remained seated in recliner at end of session with all needs in reach.  Session 2: Pt received seated in recliner, denies pain, and  agreeable to therapy. Pt ambulated independently to/from room with RW. ModI stair negotiation with lateral step-to pattern with one handrail (3x12 six inch steps). Biodex training with S; pt able to weightshift appropriately to play balance training games and exhibits proper ankle strategies. Nustep at resistance 6 to enhance cardiovascular endurance for 8 minutes. Pt required one rest break after 5 mins. Pt educated on target HR and what intensity, frequency, and duration of aerobic exercise he should be performing at home (120-130 bpm based on Karvonen formula). No further questions regarding d/c home at the end of the session. Pt remained seating in recliner with all needs in reach at the end of the session.  [place] Loryn Haacke, SPT 02/14/2018, 3:40 PM

## 2018-02-14 NOTE — Patient Care Conference (Signed)
Inpatient RehabilitationTeam Conference and Plan of Care Update Date: 02/15/2018   Time: 8:37 AM    Patient Name: Henry Morrison      Medical Record Number: [id number]  Date of Birth: 1945/06/25 Sex: Male         Room/Bed: 4M04C/4M04C-01 Payor Info: Payor: [employer] / Plan: [employer] MEDICARE / Product Type: *No Product type* /    Admitting Diagnosis: GBS  Admit Date/Time:  02/09/2018  2:22 PM Admission Comments: No comment available   Primary Diagnosis:  <principal problem not specified> Principal Problem: <principal problem not specified>  Patient Active Problem List   Diagnosis Date Noted  . 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 GBS (Guillain Barre syndrome) ([place]) 02/09/2018  . Guillain Barr syndrome ([place])   . Dyslipidemia   . Benign essential HTN   . Prediabetes   . Hyponatremia   . Ascending paralysis ([place]) 02/03/2018  . Hyperglycemia 02/03/2018  . Primary localized osteoarthritis of right knee 06/09/2015  . DJD (degenerative joint disease) of knee 06/09/2015  . Preoperative clearance 07/06/2014  . CAD (coronary artery disease) 06/14/2013  . Familial hyperlipidemia, high LDL 06/14/2013  . Essential hypertension 06/14/2013  . Melanoma ([place]) 06/14/2013    Expected Discharge Date: Expected Discharge Date: 02/15/18  Team Members Present: Physician leading conference: Dr. Delice Lesch Social Worker Present: Ovidio Kin, LCSW Nurse Present: Blair Heys, RN PT Present: Kem Parkinson, PT OT Present: Napoleon Form, OT SLP Present: Windell Moulding, SLP PPS Coordinator present : Daiva Nakayama, RN, CRRN     Current Status/Progress Goal Weekly Team Focus  Medical   Decreased functional mobility secondary to GBS.  IVIG completed 02/07/2018  Improve mobility, balance, labile BP  See above   Bowel/Bladder   cont b/b; lbm 10/1  maintain cont B & B assess q shift and prn   Swallow/Nutrition/ Hydration             ADL's   Mod I overall  Mod I overall  Goals met, pt to  d/c home with wife to assist PRN   Mobility     mod/i overall supervision with car and stairs   mod/i level     Communication             Safety/Cognition/ Behavioral Observations            Pain   pt stated generalized after physical therapy  <3 pain managed with meds assess q shift and prn   Skin   CDI  maintain monitor skin no current issues assess q shift and prn      *See Care Plan and progress notes for long and short-term goals.     Barriers to Discharge  Current Status/Progress Possible Resolutions Date Resolved   Physician    Medical stability     See above  Therapies, optimize BP meds      Nursing                  PT                    OT                  SLP                SW                Discharge Planning/Teaching Needs:    Home with wife who can provide supervision level. Has been in for education  with therapies.     Team Discussion:  Goals mod/i level and doing well to reach today. Medically stable IVIG completed and OA in knee better with movement. Pt wants to go home today and has requested this.  Revisions to Treatment Plan:  DC 10/2    Continued Need for Acute Rehabilitation Level of Care: The patient requires daily medical management by a physician with specialized training in physical medicine and rehabilitation for the following conditions: Daily analysis of laboratory values and/or radiology reports with any subsequent need for medication adjustment of medical intervention for : Neurological problems;Blood pressure problems   I attest that I was present, lead the team conference, and concur with the assessment and plan of the team.   Elease Hashimoto 02/15/2018, 8:37 AM

## 2018-02-14 NOTE — Discharge Summary (Signed)
NAME: Henry Morrison, Henry Morrison. MEDICAL RECORD IR:[id number] ACCOUNT [id number] DATE OF BIRTH:Aug 23, 1945 FACILITY: [facility] LOCATION: [facility] PHYSICIAN:ANKIT PATEL, MD  DISCHARGE SUMMARY  DATE OF DISCHARGE:  02/15/2018  DISCHARGE DIAGNOSES:  1.  Guillain-Barre syndrome   2.  Subcutaneous heparin for deep venous thrombosis prophylaxis. 3.  Pain management. 4.  Hypertension.   5.  Coronary artery disease with stenting.  6.  Hyperlipidemia. 7.  Constipation. 5.  Acute renal insufficiency.  HISTORY OF PRESENT ILLNESS:  This is a 72 year old right-handed male with history of CAD with stenting, hyperlipidemia, hypertension.  Lives with spouse, independent prior to admission.  Presented 02/03/2018 with lower extremity weakness and numbness.   noted the patient had a flu shot approximately 1 week prior to admission.  Cranial CT scan unremarkable.  CK elevated 604.  Lumbar puncture showed protein 58 and a cell count of 2.  Neurology consulted for suspect DBS.  Placed on IVIG times 5 days.   Completed 02/07/2018.  Subcutaneous heparin for DVT prophylaxis.  Tolerating a regular diet.  The patient was admitted for a comprehensive rehabilitation program.  PAST MEDICAL HISTORY:  See discharge diagnoses.  SOCIAL HISTORY:  Lives with spouse, independent prior to admission.  FUNCTIONAL STATUS:  Upon admission to rehab services was minimum to moderate assist 75 feet rolling walker, min mod assist sit to stand, stand pivot transfers, minimal assist with activities of daily living.  PHYSICAL EXAMINATION: VITAL SIGNS:  Blood pressure 174/97, pulse 73, temperature 98, respirations 18. GENERAL:  Alert male in no acute distress. HEENT:  EOMs intact. NECK:  Supple, nontender, no JVD. CARDIOVASCULAR:  Rate controlled. ABDOMEN:  Soft, nontender, good bowel sounds. LUNGS:  Clear to auscultation without wheeze.  REHABILITATION HOSPITAL COURSE:  The patient was admitted to inpatient rehabilitation services.  Therapies  initiated on a 3-hour daily basis, consisting of physical therapy, occupational therapy and rehabilitation nursing.  The following issues were  addressed during patient's rehabilitation stay.  Pertaining to the patient's GBS, he completed IVIG 02/07/2018.  He continued to progress nicely with his overall therapies.  Subcutaneous heparin for DVT prophylaxis.  No bleeding episodes.   Blood pressure is controlled with Imdur and Toprol.  His Norvasc had been increased to 10 mg.    Noted during hospital rehabilitation stay, BUN elevated at 50, creatinine 1.53.  Altace was held received IV fluids.  Altace was yet to be resumed as Norvasc had been increased recently for blood pressure management.    He would follow up with his primary MD.    CAD with stenting.  No chest pain or shortness of breath.  He continued on Lipitor for history of hyperlipidemia.    Bouts of constipation, resolved with laxative assistance.    The patient received weekly collaborative interdisciplinary team conferences to discuss estimated length of stay, family teaching, any barriers to his discharge.  Transfers edge of bed supervision.  Ambulates contact guard without assistive device,  working on static dynamic balance.  Gathered belongings for activities of daily living and homemaking.  Was discussed no driving.  He can navigate 12 stairs and supervision.  PLAN:  Discharge to home.  DISCHARGE MEDICATIONS:  At time of dictation included Norvasc 10 mg p.o. daily, Lipitor 80 mg p.o. daily, Zetia 10 mg p.o. daily, Imdur 30 mg p.o. daily, metoprolol 25 mg at bedtime, multivitamin daily, MiraLax daily, hold for loose stools.  Tylenol as  needed, tramadol as needed for pain.  DIET:  Regular.  Latest labs 02/13/2018 showed a sodium 134, potassium  4.6, BUN 32, creatinine 1.0.  SPECIAL INSTRUCTIONS:  No driving.  The patient's Altace had recently been held due to mild renal insufficiency.  Since improved Norvasc recently increased.   The patient to follow up with primary MD.  No driving.  AN/NUANCE P:79/21/7837 T:02/14/2018 JOB:[id number]

## 2018-02-14 NOTE — Progress Notes (Signed)
Occupational Therapy Discharge Summary  Patient Details  Name: Henry Morrison MRN: [id number] Date of Birth: [birth year]   Patient has met 9 of 9 long term goals due to improved activity tolerance, improved balance, postural control and improved coordination.  Patient to discharge at overall Modified Independent level.  Patient's care partner is independent to provide the necessary physical assistance at discharge.  Pt's wife has been present throughout rehab admission and is aware of pt's current level of function, deficits and functional implications. Education provided to pt and caregiver, both voicing feeling comfortable and confident with planned d/c home at mod I level using RW.   Recommendation:  Patient with no further OT needs  Equipment: No equipment provided  Reasons for discharge: treatment goals met and discharge from hospital  Patient/family agrees with progress made and goals achieved: Yes  OT Discharge Precautions/Restrictions  Precautions Precautions: Fall Restrictions Weight Bearing Restrictions: No Vision Baseline Vision/History: Wears glasses(Contacts) Patient Visual Report: No change from baseline Vision Assessment?: No apparent visual deficits Perception  Perception: Within Functional Limits Praxis Praxis: Intact Cognition Overall Cognitive Status: Within Functional Limits for tasks assessed Arousal/Alertness: Awake/alert Orientation Level: Oriented X4 Memory: Appears intact Awareness: Appears intact Problem Solving: Appears intact Safety/Judgment: Appears intact Sensation Sensation Light Touch: Impaired Detail Light Touch Impaired Details: Impaired RUE;Impaired LUE;Impaired RLE;Impaired LLE(reports tingling sensation in B fingertips) Proprioception: Impaired Detail Proprioception Impaired Details: Impaired LLE;Impaired RLE Coordination Gross Motor Movements are Fluid and Coordinated: Yes Fine Motor Movements are Fluid and Coordinated: Yes Motor   Motor Motor: Within Functional Limits Trunk/Postural Assessment  Cervical Assessment Cervical Assessment: Within Functional Limits Thoracic Assessment Thoracic Assessment: Within Functional Limits Lumbar Assessment Lumbar Assessment: Within Functional Limits Postural Control Postural Control: Within Functional Limits  Balance Dynamic Sitting Balance Dynamic Sitting - Level of Assistance: 7: Independent Static Standing Balance Static Standing - Level of Assistance: 7: Independent Dynamic Standing Balance Dynamic Standing - Level of Assistance: 26: Mod. Ind. Extremity/Trunk Assessment RUE Assessment RUE Assessment: Within Functional Limits LUE Assessment LUE Assessment: Within Functional Limits   Henry Morrison L 02/14/2018, 2:44 PM

## 2018-02-14 NOTE — Progress Notes (Signed)
Occupational Therapy Session Note  Patient Details  Name: Henry Morrison MRN: [id number] Date of Birth: 1946/03/22  Today's Date: 02/14/2018 OT Individual Time: 1015-1045 OT Individual Time Calculation (min): 30 min    Short Term Goals: Week 1:  OT Short Term Goal 1 (Week 1): STGs = LTGs  Skilled Therapeutic Interventions/Progress Updates:    1:1 Focus on therapeutic activities with focus on activity tolerance, cardiovascular endurance, core and LE strengthening. Pt performed sit to stands with tossing a 2lb ball back and forth on rebounder and then transitioned to stepping onto a step and throwing the ball and then stepping back off without UE support with min  Guard. Also perform lateral side stepping down the gym with resistance band. Also perform open chained exercise of drawing letters with LEs focusing on coordination. Pt can demonstrate functional mobilty around his room mod I with RW.  Also discussed HEP focusing on [facility] and stereognosis.   Therapy Documentation Precautions:  Precautions Precautions: Fall Restrictions Weight Bearing Restrictions: No Pain:  no c/o pain in session    Therapy/Group: Individual Therapy  Willeen Cass [facility] 02/14/2018, 2:45 PM

## 2018-02-14 NOTE — Plan of Care (Signed)
  Problem: Consults Goal: RH GENERAL PATIENT EDUCATION Description See Patient Education module for education specifics. Outcome: Progressing Goal: RH GENERAL PATIENT EDUCATION Description See Patient Education module for education specifics. Outcome: Progressing   Problem: RH SKIN INTEGRITY Goal: RH STG SKIN FREE OF INFECTION/BREAKDOWN Description No new breakdown with Mod I assist   Outcome: Progressing   Problem: RH SAFETY Goal: RH STG ADHERE TO SAFETY PRECAUTIONS W/ASSISTANCE/DEVICE Description STG Adhere to Safety Precautions With Supervision Assistance/Device.  Outcome: Progressing Flowsheets (Taken 02/14/2018 1410) STG:Pt will adhere to safety precautions with assistance/device: 6-Modified independent   Problem: RH PAIN MANAGEMENT Goal: RH STG PAIN MANAGED AT OR BELOW PT'S PAIN GOAL Description < 4 out of 10.   Outcome: Progressing

## 2018-02-15 MED ORDER — TRAMADOL HCL 50 MG PO TABS: 50.0000 mg | ORAL_TABLET | Freq: Three times a day (TID) | ORAL | 0 refills | Status: DC | PRN

## 2018-02-15 MED ORDER — ATORVASTATIN CALCIUM 80 MG PO TABS: 80.0000 mg | ORAL_TABLET | Freq: Every day | ORAL | 11 refills | Status: DC

## 2018-02-15 MED ORDER — AMLODIPINE BESYLATE 10 MG PO TABS: 10.0000 mg | ORAL_TABLET | Freq: Every day | ORAL | 1 refills | Status: DC

## 2018-02-15 MED ORDER — ISOSORBIDE MONONITRATE ER 30 MG PO TB24: 30.0000 mg | ORAL_TABLET | Freq: Every day | ORAL | 11 refills | Status: DC

## 2018-02-15 MED ORDER — POLYETHYLENE GLYCOL 3350 17 G PO PACK: 17.0000 g | PACK | Freq: Every day | ORAL | 0 refills | Status: DC

## 2018-02-15 MED ORDER — DICLOFENAC SODIUM 1 % TD GEL: 2.0000 g | Freq: Four times a day (QID) | TRANSDERMAL | 1 refills | Status: DC | PRN

## 2018-02-15 MED ORDER — METOPROLOL SUCCINATE ER 25 MG PO TB24: 25.0000 mg | ORAL_TABLET | Freq: Every day | ORAL | 11 refills | Status: DC

## 2018-02-15 NOTE — Progress Notes (Signed)
Pt discharged with wife. D/c instruction given. No question at this time. Belongings accounted for.

## 2018-02-16 ENCOUNTER — Telehealth: Payer: Self-pay

## 2018-02-16 NOTE — Telephone Encounter (Signed)
Transitional Care call  Patient name: Henry Morrison) DOB: (February 17, 1946) 1. Are you/is patient experiencing any problems since coming home? (NO) a. Are there any questions regarding any aspect of care? (NA) 2. Are there any questions regarding medications administration/dosing? (NO) a. Are meds being taken as prescribed? (YES) b. "Patient should review meds with caller to confirm"  3. Have there been any falls? (NO) 4. Has Home Health been to the house and/or have they contacted you? (YES) a. If not, have you tried to contact them? (NA) b. Can we help you contact them? (NA) 5. Are bowels and bladder emptying properly? (YES) a. Are there any unexpected incontinence issues? (NO) b. If applicable, is patient following bowel/bladder programs? (NA) 6. Any fevers, problems with breathing, unexpected pain? (NO) 7. Are there any skin problems or new areas of breakdown? (NO) 8. Has the patient/family member arranged specialty MD follow up (ie cardiology/neurology/renal/surgical/etc.)?  (YES) a. Can we help arrange? (NA) 9. Does the patient need any other services or support that we can help arrange? (NO) 10. Are caregivers following through as expected in assisting the patient? (YES) 11. Has the patient quit smoking, drinking alcohol, or using drugs as recommended? (NO, HAD GLASS OF WINE LAST NIGHT)  Appointment date/time (02-24-2018 / 11:40), arrive time (11:20) and who it is with here (Dr. Posey Pronto) [place]

## 2018-02-17 ENCOUNTER — Encounter: Payer: Self-pay | Admitting: Rehabilitative and Restorative Service Providers"

## 2018-02-17 ENCOUNTER — Other Ambulatory Visit: Payer: Self-pay

## 2018-02-17 ENCOUNTER — Ambulatory Visit: Payer: Medicare Other | Attending: Pediatrics | Admitting: Rehabilitative and Restorative Service Providers"

## 2018-02-17 DIAGNOSIS — R2681 Unsteadiness on feet: Secondary | ICD-10-CM | POA: Diagnosis present

## 2018-02-17 DIAGNOSIS — M6281 Muscle weakness (generalized): Secondary | ICD-10-CM | POA: Insufficient documentation

## 2018-02-17 DIAGNOSIS — R296 Repeated falls: Secondary | ICD-10-CM | POA: Insufficient documentation

## 2018-02-17 DIAGNOSIS — R293 Abnormal posture: Secondary | ICD-10-CM | POA: Insufficient documentation

## 2018-02-17 DIAGNOSIS — R29818 Other symptoms and signs involving the nervous system: Secondary | ICD-10-CM | POA: Diagnosis present

## 2018-02-17 DIAGNOSIS — R2689 Other abnormalities of gait and mobility: Secondary | ICD-10-CM | POA: Insufficient documentation

## 2018-02-17 NOTE — Therapy (Signed)
[facility] [address] [place], [place], [address] Phone: [phone]   Fax:  [phone]  Physical Therapy Evaluation  Patient Details  Name: Henry Morrison MRN: [id number] Date of Birth: 26-Sep-1945 Referring Provider (PT): Delice Lesch, MD   Encounter Date: 02/17/2018  PT End of Session - 02/17/18 1347    Visit Number  1    Number of Visits  13    Date for PT Re-Evaluation  04/03/18    Authorization Type  [employer]- *requires 10th visit PN    PT Start Time  0930    PT Stop Time  1015    PT Time Calculation (min)  45 min    Equipment Utilized During Treatment  Gait belt    Activity Tolerance  Patient tolerated treatment well    Behavior During Therapy  [facility] for tasks assessed/performed       Past Medical History:  Diagnosis Date  . Arthritis   . CAD (coronary artery disease)   . Cancer ([place])    skin cancer   . Hyperlipidemia   . Primary localized osteoarthritis of right knee 06/09/2015    Past Surgical History:  Procedure Laterality Date  . COLONOSCOPY    . CORONARY ANGIOPLASTY WITH STENT PLACEMENT  10/2000   stenting of the CX vessel  . KNEE ARTHROSCOPY W/ MENISCECTOMY Left 07/15/2006  . KNEE ARTHROSCOPY W/ MENISCECTOMY Right 02/09/2012  . removal of skin cancer     right arm  . TOTAL KNEE ARTHROPLASTY Right 06/09/2015   Procedure: RIGHT TOTAL KNEE ARTHROPLASTY;  Surgeon: Elsie Saas, MD;  Location: [facility];  Service: Orthopedics;  Laterality: Right;  . US ECHOCARDIOGRAPHY  03/07/2012   Trace AI,mild MR,mild to mod TR    There were no vitals filed for this visit.   Subjective Assessment - 02/17/18 0938    Subjective  Pt reports absent sensation to bilateral plantar surface of feet since diagnosis onset. Reports health issues started after getting a flu shot and he collapsed 4 days later while in the shower when he went to stand up due to loss of strength in his knees. Pt reports he can  dress himself and perform all ADL's with the only limitation being small buttons 2/2 to poor fine motor skills. Pt reports significant tingling in his hands that worsens when washing his hair and typically stops within 1 hour duration. Reports he is going on a trip to [place] with family in 6 weeks and needs to improve functionally.     Patient is accompained by:  Family member   [name]   Pertinent History   CAD with stenting, skin cancer, HLD, HTN, R knee OA    Limitations  Standing;Walking;Lifting    Patient Stated Goals  Reports goal of improving functional mobility in 6 weeks in preparation for family trip to [place].     Currently in Pain?  No/denies         [facility] PT Assessment - 02/17/18 0945      Assessment   Medical Diagnosis  GBS    Referring Provider (PT)  Delice Lesch, MD    Onset Date/Surgical Date  02/15/18    Next MD Visit  Pt reports follow up appt. with Dr. Posey Pronto next week    Prior Therapy  Physical therapy in inpatient rehab following GBS diagnosis       Precautions   Precautions  Fall      Restrictions   Weight Bearing Restrictions  No      Balance Screen   Has the patient fallen in the past 6 months  No    Has the patient had a decrease in activity level because of a fear of falling?   No    Is the patient reluctant to leave their home because of a fear of falling?   No      Home Environment   Living Environment  Private residence    Living Arrangements  Spouse/significant other    Type of [place] Access  Stairs to enter    Entrance Stairs-Number of Steps  4    Entrance Stairs-Rails  Left    Home Layout  Two level    Alternate Level Stairs-Number of Steps  13    Alternate Level Stairs-Rails  Right   Pt performs stairs via lateral stepping technique   Home Equipment  Walker - 2 wheels;Shower seat - built in;Cane - quad      Prior Function   Level of Independence  Independent    Leisure  Pt reports enjoying to exercise and maintain active  lifestyle. Driving his grandchildren around, playing golf, and providing maintenance repairs on his daughters homes.       Cognition   Overall Cognitive Status  Within Functional Limits for tasks assessed      Sensation   Light Touch  Impaired Detail    Light Touch Impaired Details  Absent LLE;Absent RLE    Additional Comments  Pt demonstrates absent sensation to bilateral plantar surface of feet with sensation intact proximally      Coordination   Gross Motor Movements are Fluid and Coordinated  No    Heel Shin Test  [facility]; R=L      Posture/Postural Control   Posture/Postural Control  Postural limitations    Postural Limitations  Rounded Shoulders;Forward head;Increased thoracic kyphosis;Flexed trunk      ROM / Strength   AROM / PROM / Strength  AROM;Strength      AROM   Overall AROM   Within functional limits for tasks performed      Strength   Overall Strength  Deficits    Overall Strength Comments  Pt demonstrates 4/5 LE strength when grossly assessed in sitting via MMT with exception to R hip flexor and hamstring being 3+/5       Transfers   Transfers  Sit to Stand;Stand to Sit;Stand Pivot Transfers    Sit to Stand  5: Supervision;4: Min guard    Sit to Stand Details (indicate cue type and reason)  During initial STS attempt, Pt attempts to perform stand with no UE assist and demonstrates LOB forwards with ability to utilize stepping strategy to recover with therapist providing min guard for steadying    Five time sit to stand comments   Pt performs 5x STS from standard height chair with no UE assist in 19 seconds indicative of high fall risk potential     Stand to Sit  5: Supervision    Stand Pivot Transfers  5: Supervision      Ambulation/Gait   Ambulation/Gait  Yes    Ambulation/Gait Assistance  5: Supervision    Ambulation Distance (Feet)  40 Feet    Assistive device  Rolling walker    Gait Pattern  Step-through pattern;Decreased stride length;Decreased hip/knee  flexion - right;Decreased hip/knee flexion - left;Decreased dorsiflexion - right;Decreased dorsiflexion - left;Decreased trunk rotation   L lateral whip during swing phase   Ambulation  Surface  Level;Indoor    Gait velocity  3.24 ft/sec with RW indicative of community ambulator       Standardized Balance Assessment   Standardized Balance Assessment  Berg Balance Test      Berg Balance Test   Sit to Stand  Able to stand  independently using hands    Standing Unsupported  Able to stand 2 minutes with supervision    Sitting with Back Unsupported but Feet Supported on Floor or Stool  Able to sit safely and securely 2 minutes    Stand to Sit  Controls descent by using hands    Transfers  Able to transfer safely, definite need of hands    Standing Unsupported with Eyes Closed  Able to stand 10 seconds with supervision    Standing Ubsupported with Feet Together  Able to place feet together independently and stand for 1 minute with supervision    From Standing, Reach Forward with Outstretched Arm  Can reach confidently >25 cm (10")    From Standing Position, Pick up Object from Floor  Able to pick up shoe, needs supervision    From Standing Position, Turn to Look Behind Over each Shoulder  Looks behind from both sides and weight shifts well    Turn 360 Degrees  Needs close supervision or verbal cueing    Standing Unsupported, Alternately Place Feet on Step/Stool  Able to complete >2 steps/needs minimal assist    Standing Unsupported, One Foot in Front  Able to plae foot ahead of the other independently and hold 30 seconds    Standing on One Leg  Tries to lift leg/unable to hold 3 seconds but remains standing independently    Total Score  39    Berg comment:  39/56 indicative of significant fall risk potential                 Objective measurements completed on examination: See above findings.              PT Education - 02/17/18 1346    Education Details  Therapist provided  education regarding initial evaluation clinical findings, time frame for peripheral nerve regeneration, and POC moving forward to prepare patient for upcoming trip in 6 weeks to [place] with family.     Person(s) Educated  Patient;Spouse   Wife Ezequiel Essex   Methods  Explanation    Comprehension  Verbalized understanding       PT Short Term Goals - 02/17/18 1451      PT SHORT TERM GOAL #1   Title  Pt will participate in the initiation of an HEP to improve LE strength and balance to promote increased independence with functional mobility    Time  3    Period  Weeks    Status  New    Target Date  03/10/18      PT SHORT TERM GOAL #2   Title  Pt will improve Berg score to >/= 43/56 indicating decreased fall risk potential     Baseline  10/4: 39/56 indicative of significant fall risk potential     Time  3    Period  Weeks    Status  New    Target Date  03/10/18      PT SHORT TERM GOAL #3   Title  Pt will improve 5x STS from standard height chair with no UE assist to <16 seconds indicating reduction in fall risk and improvement in LE power     Baseline  10/4:  19 seconds indicative of high fall risk     Time  3    Period  Weeks    Status  New    Target Date  03/10/18      PT SHORT TERM GOAL #4   Title  Pt will participate in 6 min walk test with LRAD to assess baseline endurance during functional mobility in preparation for upcoming trip to [place] ambulating short distance with RW    Time  3    Period  Weeks    Status  New    Target Date  03/10/18      PT SHORT TERM GOAL #5   Title  Pt will negotiate 12 steps with single HR and step to pattern technique with CGA to simulate home environment and improve functional mobility     Baseline  Pt negotiates 12 steps with single HR utilizing lateral stepping technique    Time  3    Period  Weeks    Status  New    Target Date  03/10/18      Additional Short Term Goals   Additional Short Term Goals  Yes      PT SHORT  TERM GOAL #6   Title  Pt will improve participate in gait speed assessment with LRAD to assess fall risk potential during functional mobility     Baseline  10/4: 3.24 ft/sec using RW     Time  3    Period  Weeks    Status  New    Target Date  03/10/18      PT SHORT TERM GOAL #7   Title  Pt will participate in DGI to establish baseline fall risk with dynamic gait     Time  3    Period  Weeks    Status  New    Target Date  03/10/18      PT SHORT TERM GOAL #8   Title  Pt will ambulate 500 feet in community negotiating uneven surfaces, curbs, inclines, and ability to vary gait speed when cued using LRAD and CGA to improve functional mobility with community distances.     Time  3    Period  Weeks    Status  New    Target Date  03/10/18        PT Long Term Goals - 02/17/18 1501      PT LONG TERM GOAL #1   Title  Pt will report compliancy and independence with HEP to improve strength, balance, and functional mobility     Time  6    Period  Weeks    Status  New    Target Date  04/03/18      PT LONG TERM GOAL #2   Title  Pt will improve Berg score to >/= 47/56 indicating reduction in fall risk potential     Time  6    Period  Weeks    Status  New    Target Date  04/03/18      PT LONG TERM GOAL #3   Title  Pt will improve 5x STS time to </= 13 seconds from a standard height chair with no UE assist indicating decreased fall risk potential and improved LE power     Time  6    Period  Weeks    Status  New    Target Date  04/03/18      PT LONG TERM GOAL #4  Title  Pt will improve 6 min walk test score by 100 feet using LRAD indicating improvement in endurance and functional mobility     Time  6    Period  Weeks    Status  New    Target Date  04/03/18      PT LONG TERM GOAL #5   Title  Pt will negotiate 12 steps using single HR and recirpocal stepping technique, Mod I, indicating improvement in functional mobility and community accessibility.     Time  6    Period  Weeks     Status  New    Target Date  04/03/18      Additional Long Term Goals   Additional Long Term Goals  Yes      PT LONG TERM GOAL #6   Title  Pt will improve gait speed >/= 2.62 seconds using LRAD indicating improvement in functional mobility and reduction in fall risk     Time  6    Period  Weeks    Status  New    Target Date  04/03/18      PT LONG TERM GOAL #7   Title  Pt will ambulate 1000 feet negotiating uneven terrain, curbs, ramps, and ability to change gait speed when cued, Mod I using LRAD, demonstrating improvement in functional mobility with community distances.     Time  6    Period  Weeks    Status  New    Target Date  04/03/18      PT LONG TERM GOAL #8   Title  Pt will score >/= 19/24 on the dynamic gait index indicating reduction in fall risk potential during dynamic gait     Time  6    Period  Weeks    Status  New    Target Date  04/03/18             Plan - 02/17/18 1349    Clinical Impression Statement  Pt is a 72 year old male referred to Neuro OPPT for evaluation of functional mobility s/p GBS dx. Pt's PMH is significant for the following: CAD with stenting, skin cancer, HLD, HTN, and R knee OA. The following deficits were noted during pt's exam: Pt demonstrates 4/5 bilateral LE strength during MMT assessment when grossly assessed in sitting with exception to R hip flexor and hamstring being 3+/5. Pt demonstrates absent sensation to plantar surface of bilateral feet negatively impacting patient's balance during functional mobility. Pt's gait speed of 3.24 ft/sec using his RW is indicative of community ambulation. Pt's 5x STS time of 19 seconds indicates patient is considered a high risk for falls. Pt's Berg score of 39/56 indicates he is a significant fall risk. Pt would benefit from skilled PT to address these impairments and functional limitations to maximize functional mobility independence and reduce falls risk.    History and Personal Factors relevant to  plan of care:  CAD with stenting, skin cancer, HLD, HTN, R knee OA. Pt has 12 steps with single HR for bed/bath accessibility and has difficulty performing steps, reliance on wife for transportation 2/2 absent sensation to plantar surface of feet, enjoys active life style and keeping up with grandchildren, golfing with friends, and assisting with maintenance repairs on his daughters homes. Pt reports he needs to improve functionally in 6 weeks in preparation for his upcoming trip to [place] and does not want to be limited functionally reporting he can only ambulate short distances with his RW.  Clinical Presentation  Evolving    Clinical Presentation due to:  CAD with stenting, skin cancer, HLD, HTN, R knee OA. Pt has 12 steps with single HR for bed/bath accessibility and has difficulty performing steps, reliance on wife for transportation 2/2 absent sensation to plantar surface of feet, enjoys active life style and keeping up with grandchildren, golfing with friends, and assisting with maintenance repairs on his daughters homes. Pt reports he needs to improve functionally in 6 weeks in preparation for his upcoming trip to [place] and does not want to be limited functionally reporting he can only ambulate short distances with his RW.     Clinical Decision Making  Moderate    Rehab Potential  Good    Clinical Impairments Affecting Rehab Potential  Peripheral nerve regeneration time frame and absent sensation to plantar surface of bilateral feet negatively effecting patient's balance    PT Frequency  2x / week    PT Duration  6 weeks    PT Treatment/Interventions  ADLs/Self Care Home Management;Stair training;Functional mobility training;Patient/family education;Therapeutic activities;Therapeutic exercise;Passive range of motion;Balance training;DME Instruction;Electrical Stimulation;Gait training;Neuromuscular re-education;Manual techniques;Energy conservation    PT Next Visit Plan  6 minute walk test, DGI,  stair negotiation, assess gait with cane, work on balance and proprioceptive awareness 2/2 absent plantar sensation bilaterally, corner balance, establish HEP    PT Home Exercise Plan  tbd    Consulted and Agree with Plan of Care  Patient;Family member/caregiver    Family Member Consulted  Wife Ezequiel Essex       Patient will benefit from skilled therapeutic intervention in order to improve the following deficits and impairments:  Abnormal gait, Decreased coordination, Difficulty walking, Decreased endurance, Impaired UE functional use, Decreased activity tolerance, Decreased balance, Decreased knowledge of use of DME, Decreased mobility, Decreased strength, Impaired sensation  Visit Diagnosis: Unsteadiness on feet  Other abnormalities of gait and mobility  Repeated falls  Abnormal posture  Muscle weakness (generalized)  Other symptoms and signs involving the nervous system     Problem List Patient Active Problem List   Diagnosis Date Noted  . 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 Guillain Barr syndrome ([place])   . Dyslipidemia   . Benign essential HTN   . Prediabetes   . Hyponatremia   . Ascending paralysis ([place]) 02/03/2018  . Hyperglycemia 02/03/2018  . Primary localized osteoarthritis of right knee 06/09/2015  . DJD (degenerative joint disease) of knee 06/09/2015  . Preoperative clearance 07/06/2014  . CAD (coronary artery disease) 06/14/2013  . Familial hyperlipidemia, high LDL 06/14/2013  . Essential hypertension 06/14/2013  . Melanoma ([place]) 06/14/2013    Floreen Comber, SPT 02/17/2018, 3:13 PM  [facility] [address] [place], [place], [address] Phone: [phone]   Fax:  [phone]  Name: TAMER BAUGHMAN MRN: [id number] Date of Birth: 27-Sep-1945

## 2018-02-24 ENCOUNTER — Encounter: Payer: Medicare Other | Attending: Physical Medicine & Rehabilitation | Admitting: Physical Medicine & Rehabilitation

## 2018-02-24 ENCOUNTER — Encounter: Payer: Self-pay | Admitting: Physical Medicine & Rehabilitation

## 2018-02-24 VITALS — BP 149/90 | HR 73 | Resp 14 | Ht 71.0 in | Wt 191.0 lb

## 2018-02-24 DIAGNOSIS — I10 Essential (primary) hypertension: Secondary | ICD-10-CM | POA: Insufficient documentation

## 2018-02-24 DIAGNOSIS — Z8249 Family history of ischemic heart disease and other diseases of the circulatory system: Secondary | ICD-10-CM | POA: Diagnosis not present

## 2018-02-24 DIAGNOSIS — Z96651 Presence of right artificial knee joint: Secondary | ICD-10-CM | POA: Diagnosis not present

## 2018-02-24 DIAGNOSIS — M1711 Unilateral primary osteoarthritis, right knee: Secondary | ICD-10-CM | POA: Insufficient documentation

## 2018-02-24 DIAGNOSIS — E785 Hyperlipidemia, unspecified: Secondary | ICD-10-CM | POA: Diagnosis not present

## 2018-02-24 DIAGNOSIS — R208 Other disturbances of skin sensation: Secondary | ICD-10-CM | POA: Diagnosis not present

## 2018-02-24 DIAGNOSIS — I2510 Atherosclerotic heart disease of native coronary artery without angina pectoris: Secondary | ICD-10-CM | POA: Insufficient documentation

## 2018-02-24 DIAGNOSIS — G610 Guillain-Barre syndrome: Secondary | ICD-10-CM | POA: Insufficient documentation

## 2018-02-24 DIAGNOSIS — R269 Unspecified abnormalities of gait and mobility: Secondary | ICD-10-CM | POA: Insufficient documentation

## 2018-02-24 NOTE — Progress Notes (Addendum)
Subjective:    Patient ID: Henry Morrison, male    DOB: 1946/01/21, 72 y.o.   MRN: [id number]  HPI 72 year old right-handed male with history of CAD with stenting, hyperlipidemia, hypertension presents for transitional care management after receiving CIR for GBS.   DATE OF ADMISSION:  02/09/2018 DATE OF DISCHARGE: 02/15/2018   At discharge, he was instructed to follow up with PCP, which he did. Pain is absent. BP is slightly elevated today, but states WNL at home. Constipation has resolved. Denies further near syncopal episodes.  Denies falls. He complains of dysesthesias b/l hand, predominantly at night.   Therapies: 2/week in the future, currently  Mobility: Cane at all times DME: Previously posessed   Pain Inventory Average Pain 0 Pain Right Now 0 My pain is no pain  In the last 24 hours, has pain interfered with the following? General activity 0 Relation with others 0 Enjoyment of life 0 What TIME of day is your pain at its worst? evening Sleep (in general) Fair  Pain is worse with: walking, bending, inactivity, standing and some activites Pain improves with: therapy/exercise Relief from Meds: no pain  Mobility walk with assistance use a cane use a walker how many minutes can you walk? 30 ability to climb steps?  yes do you drive?  no Do you have any goals in this area?  yes  Function not employed: date last employed . Do you have any goals in this area?  yes  Neuro/Psych No problems in this area  Prior Studies transitional care  Physicians involved in your care transitional care   Family History  Problem Relation Age of Onset  . Heart disease Mother   . Cancer Father        kidney   Social History   Socioeconomic History  . Marital status: Married    Spouse name: Not on file  . Number of children: Not on file  . Years of education: Not on file  . Highest education level: Not on file  Occupational History  . Not on file  Social Needs  .  Financial resource strain: Not on file  . Food insecurity:    Worry: Not on file    Inability: Not on file  . Transportation needs:    Medical: Not on file    Non-medical: Not on file  Tobacco Use  . Smoking status: Never Smoker  . Smokeless tobacco: Never Used  Substance and Sexual Activity  . Alcohol use: Yes    Alcohol/week: 10.0 standard drinks    Types: 10 Standard drinks or equivalent per week    Comment: wine 2 galsses each day  . Drug use: No  . Sexual activity: Not on file  Lifestyle  . Physical activity:    Days per week: Not on file    Minutes per session: Not on file  . Stress: Not on file  Relationships  . Social connections:    Talks on phone: Not on file    Gets together: Not on file    Attends religious service: Not on file    Active member of club or organization: Not on file    Attends meetings of clubs or organizations: Not on file    Relationship status: Not on file  Other Topics Concern  . Not on file  Social History Narrative  . Not on file   Past Surgical History:  Procedure Laterality Date  . COLONOSCOPY    . CORONARY ANGIOPLASTY WITH STENT PLACEMENT  10/2000   stenting of the CX vessel  . KNEE ARTHROSCOPY W/ MENISCECTOMY Left 07/15/2006  . KNEE ARTHROSCOPY W/ MENISCECTOMY Right 02/09/2012  . removal of skin cancer     right arm  . TOTAL KNEE ARTHROPLASTY Right 06/09/2015   Procedure: RIGHT TOTAL KNEE ARTHROPLASTY;  Surgeon: Elsie Saas, MD;  Location: [facility];  Service: Orthopedics;  Laterality: Right;  . US ECHOCARDIOGRAPHY  03/07/2012   Trace AI,mild MR,mild to mod TR   Past Medical History:  Diagnosis Date  . Arthritis   . CAD (coronary artery disease)   . Cancer ([place])    skin cancer   . Hyperlipidemia   . Primary localized osteoarthritis of right knee 06/09/2015   BP (!) 149/90   Pulse 73   Resp 14   Ht 5\' 11"  (1.803 m)   Wt 191 lb (86.6 kg)   SpO2 96%   BMI 26.64 kg/m   Opioid Risk Score:   Fall Risk Score:  `1  Depression  screen PHQ 2/9  Depression screen [facility] 2/9 02/24/2018 09/26/2015  Decreased Interest 0 0  Down, Depressed, Hopeless 0 0  PHQ - 2 Score 0 0  Altered sleeping 1 -  Tired, decreased energy 1 -  Change in appetite 0 -  Feeling bad or failure about yourself  0 -  Trouble concentrating 0 -  Moving slowly or fidgety/restless 1 -  Suicidal thoughts 0 -  PHQ-9 Score 3 -  Difficult doing work/chores Somewhat difficult -    Review of Systems  Constitutional: Negative.   HENT: Negative.   Eyes: Negative.   Respiratory: Negative.   Cardiovascular: Negative.   Gastrointestinal: Negative.   Endocrine: Negative.   Genitourinary: Negative.   Musculoskeletal: Positive for gait problem.  Skin: Positive for rash.  Allergic/Immunologic: Negative.   Neurological: Positive for numbness.  Hematological: Negative.   Psychiatric/Behavioral: Negative.   All other systems reviewed and are negative.      Objective:   Physical Exam Constitutional: No distress . Vital signs reviewed. HENT: Normocephalic.  Atraumatic. Eyes: EOMI. No discharge. Cardiovascular: RRR. No JVD. Respiratory: CTA bilaterally. Normal effort. GI: BS +. Non-distended. Musc:  No edema or tenderness in extremities.  +Durkin's, Phalen's b/l hands.  Neg tinnel's median wrist Gait: Decreased cadence Neuro: Alert Motor 4+-5/5 Bilateral ankle DF otherwise 5/5 Sensation diminished to light touch b/l feet Skin: Generalized rash. Psych: Pleasant.  Normal mood.    Assessment & Plan:  72 year old right-handed male with history of CAD with stenting, hyperlipidemia, hypertension presents for transitional care management after receiving CIR for GBS.   1. 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  IVIG completed 02/07/2018             Cont therapies  2.  Hypertension.    Cont meds  Elevated today, otherwise relatively controlled  3. Gait abnormality  Cont therapies  Cont cane for safety  4. Dyesthesias  Does not want  medications at present  5. 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

## 2018-03-01 ENCOUNTER — Encounter: Payer: Self-pay | Admitting: Physical Therapy

## 2018-03-01 ENCOUNTER — Ambulatory Visit: Payer: Medicare Other | Admitting: Physical Therapy

## 2018-03-01 DIAGNOSIS — R2681 Unsteadiness on feet: Secondary | ICD-10-CM

## 2018-03-01 DIAGNOSIS — R293 Abnormal posture: Secondary | ICD-10-CM

## 2018-03-01 DIAGNOSIS — R2689 Other abnormalities of gait and mobility: Secondary | ICD-10-CM

## 2018-03-01 DIAGNOSIS — M6281 Muscle weakness (generalized): Secondary | ICD-10-CM

## 2018-03-01 DIAGNOSIS — R29818 Other symptoms and signs involving the nervous system: Secondary | ICD-10-CM

## 2018-03-01 DIAGNOSIS — R296 Repeated falls: Secondary | ICD-10-CM

## 2018-03-01 NOTE — Therapy (Signed)
[facility] [address] [place], [place], [address] Phone: [phone]   Fax:  [phone]  Physical Therapy Treatment  Patient Details  Name: Henry Morrison MRN: [id number] Date of Birth: Aug 26, 1945 Referring Provider (PT): Delice Lesch, MD   Encounter Date: 03/01/2018  PT End of Session - 03/01/18 2207    Visit Number  2    Number of Visits  13    Date for PT Re-Evaluation  04/03/18    Authorization Type  [employer]- *requires 10th visit PN    PT Start Time  1452    PT Stop Time  1532    PT Time Calculation (min)  40 min    Equipment Utilized During Treatment  Gait belt    Activity Tolerance  Patient tolerated treatment well    Behavior During Therapy  [facility] for tasks assessed/performed       Past Medical History:  Diagnosis Date  . Arthritis   . CAD (coronary artery disease)   . Cancer ([place])    skin cancer   . Hyperlipidemia   . Primary localized osteoarthritis of right knee 06/09/2015    Past Surgical History:  Procedure Laterality Date  . COLONOSCOPY    . CORONARY ANGIOPLASTY WITH STENT PLACEMENT  10/2000   stenting of the CX vessel  . KNEE ARTHROSCOPY W/ MENISCECTOMY Left 07/15/2006  . KNEE ARTHROSCOPY W/ MENISCECTOMY Right 02/09/2012  . removal of skin cancer     right arm  . TOTAL KNEE ARTHROPLASTY Right 06/09/2015   Procedure: RIGHT TOTAL KNEE ARTHROPLASTY;  Surgeon: Elsie Saas, MD;  Location: [facility];  Service: Orthopedics;  Laterality: Right;  . US ECHOCARDIOGRAPHY  03/07/2012   Trace AI,mild MR,mild to mod TR    There were no vitals filed for this visit.  Subjective Assessment - 03/01/18 1457    Subjective  Read about B12 for neurological recovery so he is taking extra B12 each day.  Has been riding his bike at home for about 1/2/ mile and has been increasing his distance 1/2 mile each week.  Still having tingling in hands and decreased dexterity.    Patient is accompained by:   Family member   [name]   Pertinent History   CAD with stenting, skin cancer, HLD, HTN, R knee OA    Limitations  Standing;Walking;Lifting    Patient Stated Goals  Reports goal of improving functional mobility in 6 weeks in preparation for family trip to [place].     Currently in Pain?  No/denies         [facility] PT Assessment - 03/01/18 1502      Ambulation/Gait   Ambulation/Gait  Yes    Assistive device  Straight cane    Stairs  Yes    Stairs Assistance  5: Supervision    Stairs Assistance Details (indicate cue type and reason)  carries cane, touches it down intermittently for balance.    Stair Management Technique  One rail Right;Alternating pattern;Forwards;With cane    Number of Stairs  8    Height of Stairs  6      6 Minute Walk- Baseline   6 Minute Walk- Baseline  yes    BP (mmHg)  155/87    HR (bpm)  63    02 Sat (%RA)  96 %    Modified Borg Scale for Dyspnea  0- Nothing at all    Perceived Rate of Exertion (Borg)  7- Very, very light  6 Minute walk- Post Test   6 Minute Walk Post Test  yes    BP (mmHg)  174/90    HR (bpm)  69    02 Sat (%RA)  98 %    Modified Borg Scale for Dyspnea  2- Mild shortness of breath    Perceived Rate of Exertion (Borg)  11- Fairly light      6 minute walk test results    Aerobic Endurance Distance Walked  953    Endurance additional comments  used hurrycane       Standardized Balance Assessment   Standardized Balance Assessment  Dynamic Gait Index      Dynamic Gait Index   Level Surface  Mild Impairment    Change in Gait Speed  Mild Impairment    Gait with Horizontal Head Turns  Normal    Gait with Vertical Head Turns  Normal    Gait and Pivot Turn  Mild Impairment    Step Over Obstacle  Mild Impairment    Step Around Obstacles  Normal    Steps  Mild Impairment    Total Score  19    DGI comment:  19/24 - performed without cane or RW                           PT Education - 03/01/18 2206     Education Details  update to goals based on assessment findings today    Person(s) Educated  Patient    Methods  Explanation    Comprehension  Verbalized understanding       PT Short Term Goals - 03/01/18 2213      PT SHORT TERM GOAL #1   Title  Pt will participate in the initiation of an HEP to improve LE strength and balance to promote increased independence with functional mobility    Time  3    Period  Weeks    Status  New    Target Date  03/10/18      PT SHORT TERM GOAL #2   Title  Pt will improve Berg score to >/= 43/56 indicating decreased fall risk potential     Baseline  10/4: 39/56 indicative of significant fall risk potential     Time  3    Period  Weeks    Status  New      PT SHORT TERM GOAL #3   Title  Pt will improve 5x STS from standard height chair with no UE assist to <16 seconds indicating reduction in fall risk and improvement in LE power     Baseline  10/4: 19 seconds indicative of high fall risk     Time  3    Period  Weeks    Status  New      PT SHORT TERM GOAL #4   Title  Pt will participate in 6 min walk test with LRAD to assess baseline endurance during functional mobility in preparation for upcoming trip to [place] ambulating short distance with RW    Time  3    Period  Weeks    Status  Achieved      PT SHORT TERM GOAL #5   Title  Pt will negotiate 12 steps with single HR and step to pattern technique with CGA to simulate home environment and improve functional mobility     Baseline  Pt negotiates 12 steps with single HR utilizing lateral  stepping technique    Time  3    Period  Weeks    Status  New      PT SHORT TERM GOAL #6   Title  Pt will improve participate in gait speed assessment with LRAD to assess fall risk potential during functional mobility     Baseline  10/4: 3.24 ft/sec using RW     Time  3    Period  Weeks    Status  New      PT SHORT TERM GOAL #7   Title  Pt will participate in DGI to establish baseline  fall risk with dynamic gait     Time  3    Period  Weeks    Status  Achieved      PT SHORT TERM GOAL #8   Title  Pt will ambulate 500 feet in community negotiating uneven surfaces, curbs, inclines, and ability to vary gait speed when cued using LRAD and CGA to improve functional mobility with community distances.     Time  3    Period  Weeks    Status  New        PT Long Term Goals - 03/01/18 2214      PT LONG TERM GOAL #1   Title  Pt will report compliancy and independence with HEP to improve strength, balance, and functional mobility     Time  6    Period  Weeks    Status  New    Target Date  04/03/18      PT LONG TERM GOAL #2   Title  Pt will improve Berg score to >/= 47/56 indicating reduction in fall risk potential     Time  6    Period  Weeks    Status  New      PT LONG TERM GOAL #3   Title  Pt will improve 5x STS time to </= 13 seconds from a standard height chair with no UE assist indicating decreased fall risk potential and improved LE power     Time  6    Period  Weeks    Status  New      PT LONG TERM GOAL #4   Title  Pt will improve 6 min walk test score by 100 feet using LRAD indicating improvement in endurance and functional mobility     Baseline  953 with Hurrycane    Time  6    Period  Weeks    Status  Revised    Target Date  04/03/18      PT LONG TERM GOAL #5   Title  Pt will negotiate 12 steps using single HR and recirpocal stepping technique, Mod I, indicating improvement in functional mobility and community accessibility.     Time  6    Period  Weeks    Status  New    Target Date  04/03/18      PT LONG TERM GOAL #6   Title  Pt will improve gait speed >/= 2.62 seconds using LRAD indicating improvement in functional mobility and reduction in fall risk     Time  6    Period  Weeks    Status  New    Target Date  04/03/18      PT LONG TERM GOAL #7   Title  Pt will ambulate 1000 feet negotiating uneven terrain, curbs, ramps, and ability to change  gait speed when cued, Mod I using LRAD, demonstrating improvement in  functional mobility with community distances.     Time  6    Period  Weeks    Status  New    Target Date  04/03/18      PT LONG TERM GOAL #8   Title  Pt will score >/= 19/24 on the dynamic gait index indicating reduction in fall risk potential during dynamic gait     Time  6    Period  Weeks    Status  New    Target Date  04/03/18            Plan - 03/01/18 2208    Clinical Impression Statement  Continued assessment of patient's safety with stair negotiation, endurance during ambulation and balance/falls risk during ambulation.  LTG reset to reflect pt's current function.  Pt agreeable to goals.  Pt is demonstrating some improvement since eval - pt is no longer using RW at home and is consistently using cane.  Pt was also able to complete the DGI without use of AD.    Rehab Potential  Good    Clinical Impairments Affecting Rehab Potential  Peripheral nerve regeneration time frame and absent sensation to plantar surface of bilateral feet negatively effecting patient's balance    PT Frequency  2x / week    PT Duration  6 weeks    PT Treatment/Interventions  ADLs/Self Care Home Management;Stair training;Functional mobility training;Patient/family education;Therapeutic activities;Therapeutic exercise;Passive range of motion;Balance training;DME Instruction;Electrical Stimulation;Gait training;Neuromuscular re-education;Manual techniques;Energy conservation    PT Next Visit Plan  Initiate HEP; stair negotiation without UE support; work on balance and proprioceptive awareness 2/2 absent plantar sensation bilaterally, corner balance with eyes closed,    Consulted and Agree with Plan of Care  Patient       Patient will benefit from skilled therapeutic intervention in order to improve the following deficits and impairments:  Abnormal gait, Decreased coordination, Difficulty walking, Decreased endurance, Impaired UE functional  use, Decreased activity tolerance, Decreased balance, Decreased knowledge of use of DME, Decreased mobility, Decreased strength, Impaired sensation  Visit Diagnosis: Unsteadiness on feet  Other abnormalities of gait and mobility  Repeated falls  Abnormal posture  Muscle weakness (generalized)  Other symptoms and signs involving the nervous system     Problem List Patient Active Problem List   Diagnosis Date Noted  . 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 GBS (Guillain Barre syndrome) ([place]) 02/09/2018  . Guillain Barr syndrome ([place])   . Dyslipidemia   . Benign essential HTN   . Prediabetes   . Hyponatremia   . Ascending paralysis ([place]) 02/03/2018  . Hyperglycemia 02/03/2018  . Primary localized osteoarthritis of right knee 06/09/2015  . DJD (degenerative joint disease) of knee 06/09/2015  . Preoperative clearance 07/06/2014  . CAD (coronary artery disease) 06/14/2013  . Familial hyperlipidemia, high LDL 06/14/2013  . Essential hypertension 06/14/2013  . Melanoma ([place]) 06/14/2013    Rico Junker, PT, DPT 03/01/18    10:17 PM    [facility] [address] [place], [place], [address] Phone: [phone]   Fax:  [phone]  Name: ALEXIS MIZUNO MRN: [id number] Date of Birth: 1945/09/01

## 2018-03-02 ENCOUNTER — Other Ambulatory Visit: Payer: Self-pay | Admitting: Cardiovascular Disease

## 2018-03-03 ENCOUNTER — Encounter

## 2018-03-07 ENCOUNTER — Encounter: Payer: Self-pay | Admitting: Physical Therapy

## 2018-03-07 ENCOUNTER — Ambulatory Visit: Payer: Medicare Other | Admitting: Physical Therapy

## 2018-03-07 DIAGNOSIS — R2681 Unsteadiness on feet: Secondary | ICD-10-CM

## 2018-03-07 DIAGNOSIS — M6281 Muscle weakness (generalized): Secondary | ICD-10-CM

## 2018-03-07 DIAGNOSIS — R2689 Other abnormalities of gait and mobility: Secondary | ICD-10-CM

## 2018-03-07 NOTE — Patient Instructions (Signed)
Access Code: [email or web address]  URL: [email or web address]  Date: 03/07/2018  Prepared by: [place] - 10 reps - 2 sets - 1x daily - 7x weekly  Standing Hip Extension and Flexion with Resistance - 10 reps - 3 sets - 1x daily - 7x weekly  Side Stepping with Resistance at Feet - 10 reps - 3 sets - 1x daily - 7x weekly  Wall Squat Hold with Ball - 10 reps - 2 sets - 1x daily - 7x weekly  Seated Hamstring Stretch with Strap - 3 sets - 30-60 hold - 1x daily - 7x weekly  Romberg Stance - 3 sets - 30 hold - 1x daily - 7x weekly  Romberg Stance with Head Rotation - 3 sets - 30 hold - 1x daily - 7x weekly  Romberg Stance with Head Nods - 3 sets - 30 hold - 1x daily - 7x weekly  Wide Stance with Eyes Closed on Foam Pad - 3 sets - 30 hold - 1x daily - 7x weekly

## 2018-03-07 NOTE — Therapy (Signed)
[facility] [address] [place], [place], [address] Phone: [phone]   Fax:  [phone]  Physical Therapy Treatment  Patient Details  Name: Henry Morrison MRN: [id number] Date of Birth: [birth year] Referring Provider (PT): Delice Lesch, MD   Encounter Date: 03/07/2018  PT End of Session - 03/07/18 1618    Visit Number  3    Number of Visits  13    Date for PT Re-Evaluation  04/03/18    Authorization Type  [employer]- *requires 10th visit PN    PT Start Time  1530    PT Stop Time  1615    PT Time Calculation (min)  45 min    Activity Tolerance  Patient tolerated treatment well    Behavior During Therapy  [facility] for tasks assessed/performed       Past Medical History:  Diagnosis Date  . Arthritis   . CAD (coronary artery disease)   . Cancer ([place])    skin cancer   . Hyperlipidemia   . Primary localized osteoarthritis of right knee 06/09/2015    Past Surgical History:  Procedure Laterality Date  . COLONOSCOPY    . CORONARY ANGIOPLASTY WITH STENT PLACEMENT  10/2000   stenting of the CX vessel  . KNEE ARTHROSCOPY W/ MENISCECTOMY Left 07/15/2006  . KNEE ARTHROSCOPY W/ MENISCECTOMY Right 02/09/2012  . removal of skin cancer     right arm  . TOTAL KNEE ARTHROPLASTY Right 06/09/2015   Procedure: RIGHT TOTAL KNEE ARTHROPLASTY;  Surgeon: Elsie Saas, MD;  Location: [facility];  Service: Orthopedics;  Laterality: Right;  . US ECHOCARDIOGRAPHY  03/07/2012   Trace AI,mild MR,mild to mod TR    There were no vitals filed for this visit.  Subjective Assessment - 03/07/18 1531    Subjective  Reports everything has been going well and still has increased challenge with balance especially with eyes closed. Continues to have difficulty with small buttons and fine motor skills. Uses his cane for community distances and uses no AD at home. Stepping over obstacles continues to be challenging. Performs 2 miles on his  recumbent bike everyday. Reports he still does not sleep and has impaired LE sensation.     Patient is accompained by:  Family member   [name]   Pertinent History   CAD with stenting, skin cancer, HLD, HTN, R knee OA    Limitations  Standing;Walking;Lifting    Patient Stated Goals  Reports goal of improving functional mobility in 6 weeks in preparation for family trip to [place].     Currently in Pain?  No/denies      Skilled Physical Therapy Intervention:  Patient verbalizes and demonstrates understanding of the below strengthening and corner balance HEP exercises to improve functional mobility:    Exercises  Marching Bridge - 10 reps - 2 sets - 1x daily - 7x weekly  Standing Hip Extension and Flexion with Resistance - 10 reps - 3 sets - 1x daily - 7x weekly  Side Stepping with Resistance at Feet - 10 reps - 3 sets - 1x daily - 7x weekly  Wall Squat Hold with Ball - 10 reps - 2 sets - 1x daily - 7x weekly  Seated Hamstring Stretch with Strap - 3 sets - 30-60 hold - 1x daily - 7x weekly  Romberg Stance - 3 sets - 30 hold - 1x daily - 7x weekly  Romberg Stance with Head Rotation - 3 sets - 30 hold - 1x daily -  7x weekly  Romberg Stance with Head Nods - 3 sets - 30 hold - 1x daily - 7x weekly  Wide Stance with Eyes Closed on Foam Pad - 3 sets - 30 hold - 1x daily - 7x weekly       PT Education - 03/07/18 1617    Education Details  Therapist provided education regarding HEP strengthening and corner balance exercises with patient verbalizing and demonstrating understanding.     Person(s) Educated  Patient    Methods  Explanation    Comprehension  Verbalized understanding;Returned demonstration       PT Short Term Goals - 03/01/18 2213      PT SHORT TERM GOAL #1   Title  Pt will participate in the initiation of an HEP to improve LE strength and balance to promote increased independence with functional mobility    Time  3    Period  Weeks    Status  New    Target Date   03/10/18      PT SHORT TERM GOAL #2   Title  Pt will improve Berg score to >/= 43/56 indicating decreased fall risk potential     Baseline  10/4: 39/56 indicative of significant fall risk potential     Time  3    Period  Weeks    Status  New      PT SHORT TERM GOAL #3   Title  Pt will improve 5x STS from standard height chair with no UE assist to <16 seconds indicating reduction in fall risk and improvement in LE power     Baseline  10/4: 19 seconds indicative of high fall risk     Time  3    Period  Weeks    Status  New      PT SHORT TERM GOAL #4   Title  Pt will participate in 6 min walk test with LRAD to assess baseline endurance during functional mobility in preparation for upcoming trip to [place] ambulating short distance with RW    Time  3    Period  Weeks    Status  Achieved      PT SHORT TERM GOAL #5   Title  Pt will negotiate 12 steps with single HR and step to pattern technique with CGA to simulate home environment and improve functional mobility     Baseline  Pt negotiates 12 steps with single HR utilizing lateral stepping technique    Time  3    Period  Weeks    Status  New      PT SHORT TERM GOAL #6   Title  Pt will improve participate in gait speed assessment with LRAD to assess fall risk potential during functional mobility     Baseline  10/4: 3.24 ft/sec using RW     Time  3    Period  Weeks    Status  New      PT SHORT TERM GOAL #7   Title  Pt will participate in DGI to establish baseline fall risk with dynamic gait     Time  3    Period  Weeks    Status  Achieved      PT SHORT TERM GOAL #8   Title  Pt will ambulate 500 feet in community negotiating uneven surfaces, curbs, inclines, and ability to vary gait speed when cued using LRAD and CGA to improve functional mobility with community distances.  Time  3    Period  Weeks    Status  New        PT Long Term Goals - 03/01/18 2214      PT LONG TERM GOAL #1   Title  Pt will  report compliancy and independence with HEP to improve strength, balance, and functional mobility     Time  6    Period  Weeks    Status  New    Target Date  04/03/18      PT LONG TERM GOAL #2   Title  Pt will improve Berg score to >/= 47/56 indicating reduction in fall risk potential     Time  6    Period  Weeks    Status  New      PT LONG TERM GOAL #3   Title  Pt will improve 5x STS time to </= 13 seconds from a standard height chair with no UE assist indicating decreased fall risk potential and improved LE power     Time  6    Period  Weeks    Status  New      PT LONG TERM GOAL #4   Title  Pt will improve 6 min walk test score by 100 feet using LRAD indicating improvement in endurance and functional mobility     Baseline  953 with Hurrycane    Time  6    Period  Weeks    Status  Revised    Target Date  04/03/18      PT LONG TERM GOAL #5   Title  Pt will negotiate 12 steps using single HR and recirpocal stepping technique, Mod I, indicating improvement in functional mobility and community accessibility.     Time  6    Period  Weeks    Status  New    Target Date  04/03/18      PT LONG TERM GOAL #6   Title  Pt will improve gait speed >/= 2.62 seconds using LRAD indicating improvement in functional mobility and reduction in fall risk     Time  6    Period  Weeks    Status  New    Target Date  04/03/18      PT LONG TERM GOAL #7   Title  Pt will ambulate 1000 feet negotiating uneven terrain, curbs, ramps, and ability to change gait speed when cued, Mod I using LRAD, demonstrating improvement in functional mobility with community distances.     Time  6    Period  Weeks    Status  New    Target Date  04/03/18      PT LONG TERM GOAL #8   Title  Pt will score >/= 19/24 on the dynamic gait index indicating reduction in fall risk potential during dynamic gait     Time  6    Period  Weeks    Status  New    Target Date  04/03/18            Plan - 03/07/18 1618     Clinical Impression Statement  Today's skilled session focused on initiation of strengthening and corner balance HEP with patient verbalizing and demonstrating understanding. Patient tolerated session well and able to perform strengthening exercises with increased resistance. Pt will continue to benefit from skilled PT to address strengthening, balance, and functional mobility deficits.     Rehab Potential  Good    Clinical Impairments Affecting Rehab Potential  Peripheral  nerve regeneration time frame and absent sensation to plantar surface of bilateral feet negatively effecting patient's balance    PT Frequency  2x / week    PT Duration  6 weeks    PT Treatment/Interventions  ADLs/Self Care Home Management;Stair training;Functional mobility training;Patient/family education;Therapeutic activities;Therapeutic exercise;Passive range of motion;Balance training;DME Instruction;Electrical Stimulation;Gait training;Neuromuscular re-education;Manual techniques;Energy conservation    PT Next Visit Plan  rocker board balance- cone tap for LE coordination, cone tape with crossing midline, foam balance, ambulation with cane, hurdles, step ups with dec UE assist, stair negotiation without UE support; work on balance and proprioceptive awareness 2/2 absent plantar sensation bilaterally, corner balance with eyes closed,    PT Home Exercise Plan  [id number]     Consulted and Agree with Plan of Care  Patient       Patient will benefit from skilled therapeutic intervention in order to improve the following deficits and impairments:  Abnormal gait, Decreased coordination, Difficulty walking, Decreased endurance, Impaired UE functional use, Decreased activity tolerance, Decreased balance, Decreased knowledge of use of DME, Decreased mobility, Decreased strength, Impaired sensation  Visit Diagnosis: Unsteadiness on feet  Other abnormalities of gait and mobility  Muscle weakness (generalized)     Problem  List Patient Active Problem List   Diagnosis Date Noted  . 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 GBS (Guillain Barre syndrome) ([place]) 02/09/2018  . Guillain Barr syndrome ([place])   . Dyslipidemia   . Benign essential HTN   . Prediabetes   . Hyponatremia   . Ascending paralysis ([place]) 02/03/2018  . Hyperglycemia 02/03/2018  . Primary localized osteoarthritis of right knee 06/09/2015  . DJD (degenerative joint disease) of knee 06/09/2015  . Preoperative clearance 07/06/2014  . CAD (coronary artery disease) 06/14/2013  . Familial hyperlipidemia, high LDL 06/14/2013  . Essential hypertension 06/14/2013  . Melanoma ([place]) 06/14/2013    Floreen Comber, SPT 03/07/2018, 4:24 PM  [facility] [address] [place], [place], [address] Phone: [phone]   Fax:  [phone]  Name: Henry Morrison MRN: [id number] Date of Birth: 1946-01-06

## 2018-03-09 ENCOUNTER — Encounter: Payer: Self-pay | Admitting: Physical Therapy

## 2018-03-09 ENCOUNTER — Telehealth: Payer: Self-pay | Admitting: *Deleted

## 2018-03-09 ENCOUNTER — Ambulatory Visit: Payer: Medicare Other | Admitting: Physical Therapy

## 2018-03-09 DIAGNOSIS — R2689 Other abnormalities of gait and mobility: Secondary | ICD-10-CM

## 2018-03-09 DIAGNOSIS — M6281 Muscle weakness (generalized): Secondary | ICD-10-CM

## 2018-03-09 DIAGNOSIS — R2681 Unsteadiness on feet: Secondary | ICD-10-CM | POA: Diagnosis not present

## 2018-03-09 NOTE — Therapy (Signed)
[facility] [address] [place], [place], [address] Phone: [phone]   Fax:  [phone]  Physical Therapy Treatment  Patient Details  Name: Henry Morrison Date of Birth: [birth year] Referring Provider (PT): Delice Lesch, MD   Encounter Date: 03/09/2018  PT End of Session - 03/09/18 1522    Visit Number  4    Number of Visits  13    Date for PT Re-Evaluation  04/03/18    Authorization Type  [employer]- *requires 10th visit PN    PT Start Time  1315    PT Stop Time  1400    PT Time Calculation (min)  45 min    Equipment Utilized During Treatment  Gait belt    Activity Tolerance  Patient tolerated treatment well    Behavior During Therapy  [facility] for tasks assessed/performed       Past Medical History:  Diagnosis Date  . Arthritis   . CAD (coronary artery disease)   . Cancer ([place])    skin cancer   . Hyperlipidemia   . Primary localized osteoarthritis of right knee 06/09/2015    Past Surgical History:  Procedure Laterality Date  . COLONOSCOPY    . CORONARY ANGIOPLASTY WITH STENT PLACEMENT  10/2000   stenting of the CX vessel  . KNEE ARTHROSCOPY W/ MENISCECTOMY Left 07/15/2006  . KNEE ARTHROSCOPY W/ MENISCECTOMY Right 02/09/2012  . removal of skin cancer     right arm  . TOTAL KNEE ARTHROPLASTY Right 06/09/2015   Procedure: RIGHT TOTAL KNEE ARTHROPLASTY;  Surgeon: Elsie Saas, MD;  Location: [facility];  Service: Orthopedics;  Laterality: Right;  . US ECHOCARDIOGRAPHY  03/07/2012   Trace AI,mild MR,mild to mod TR    There were no vitals filed for this visit.  Subjective Assessment - 03/09/18 1320    Subjective  Reports increased soreness s/p prior therapy session indicating his hamstring stretches helped.     Patient is accompained by:  Family member   Wife [name]   Pertinent History   CAD with stenting, skin cancer, HLD, HTN, R knee OA    Limitations  Standing;Walking;Lifting     Patient Stated Goals  Reports goal of improving functional mobility in 6 weeks in preparation for family trip to [place].     Currently in Pain?  No/denies         [facility] PT Assessment - 03/09/18 1325      6 Minute Walk- Baseline   6 Minute Walk- Baseline  yes    BP (mmHg)  136/78    HR (bpm)  86    02 Sat (%RA)  95 %    Modified Borg Scale for Dyspnea  0- Nothing at all    Perceived Rate of Exertion (Borg)  7- Very, very light      6 Minute walk- Post Test   6 Minute Walk Post Test  yes    BP (mmHg)  143/69    HR (bpm)  96    02 Sat (%RA)  97 %    Modified Borg Scale for Dyspnea  2- Mild shortness of breath    Perceived Rate of Exertion (Borg)  10-      6 minute walk test results    Aerobic Endurance Distance Walked  1374    Endurance additional comments  used hurrycane      Standardized Balance Assessment   Standardized Balance Assessment  Berg Balance Test;Dynamic Gait Index  Berg Balance Test   Sit to Stand  Able to stand without using hands and stabilize independently    Standing Unsupported  Able to stand safely 2 minutes    Sitting with Back Unsupported but Feet Supported on Floor or Stool  Able to sit safely and securely 2 minutes    Stand to Sit  Sits safely with minimal use of hands    Transfers  Able to transfer safely, minor use of hands    Standing Unsupported with Eyes Closed  Able to stand 10 seconds with supervision    Standing Ubsupported with Feet Together  Able to place feet together independently and stand for 1 minute with supervision    From Standing, Reach Forward with Outstretched Arm  Can reach confidently >25 cm (10")    From Standing Position, Pick up Object from [place] to pick up shoe safely and easily    From Standing Position, Turn to Look Behind Over each Shoulder  Looks behind from both sides and weight shifts well    Turn 360 Degrees  Able to turn 360 degrees safely in 4 seconds or less    Standing Unsupported, Alternately Place  Feet on Step/Stool  Able to stand independently and complete 8 steps >20 seconds    Standing Unsupported, One Foot in Front  Able to place foot tandem independently and hold 30 seconds    Standing on One Leg  Able to lift leg independently and hold 5-10 seconds    Total Score  52    Berg comment:  52/56 indicative of lower fall risk potential       Dynamic Gait Index   Level Surface  Normal    Change in Gait Speed  Mild Impairment    Gait with Horizontal Head Turns  Normal    Gait with Vertical Head Turns  Normal    Gait and Pivot Turn  Normal    Step Over Obstacle  Mild Impairment    Step Around Obstacles  Normal    Steps  Mild Impairment    Total Score  21    DGI comment:  21/24- performed without cane           [facility] Adult PT Treatment/Exercise - 03/09/18 1516      Transfers   Transfers  Sit to Stand;Stand to Sit;Stand Pivot Transfers    Sit to Stand  5: Supervision    Five time sit to stand comments   Pt performs 5x STS from standard height chair with no UE assist in 10.43 seconds indicative of low fall risk potential     Stand to Sit  5: Supervision    Stand Pivot Transfers  5: Supervision      Ambulation/Gait   Ambulation/Gait  Yes    Ambulation/Gait Assistance  5: Supervision    Ambulation/Gait Assistance Details  Pt performs ambulation trial outdoors using his hurrycane for ambulation. Pt demonstrates ability to navigate uneven surfaces, ramps, curbs, and ability to demonstrates variations in gait speed with supervision.    Ambulation Distance (Feet)  1374 Feet    Assistive device  Straight cane    Gait Pattern  Step-through pattern;Decreased dorsiflexion - right;Decreased dorsiflexion - left;Decreased trunk rotation    Ambulation Surface  Outdoor;Unlevel;Paved    Gait velocity  4.05 ft/sec indicative of community ambulation. Pt performs with no AD    Stairs  Yes    Stairs Assistance  5: Supervision    Stair Management Technique  One rail Right;Alternating  pattern     Number of Stairs  12    Height of Stairs  6    Curb  5: Supervision             PT Education - 03/09/18 1521    Education Details  Therapist provided education regarding clinical findings of STG reassessment with patient verbalizing understanding and satisfied with improvement with functional mobility.     Person(s) Educated  Patient    Methods  Explanation    Comprehension  Verbalized understanding       PT Short Term Goals - 03/09/18 1522      PT SHORT TERM GOAL #1   Title  Pt will participate in the initiation of an HEP to improve LE strength and balance to promote increased independence with functional mobility    Time  3    Period  Weeks    Status  Achieved      PT SHORT TERM GOAL #2   Title  Pt will improve Berg score to >/= 43/56 indicating decreased fall risk potential     Baseline  10/24: 52/56 indicative of low fall risk potential     Time  3    Period  Weeks    Status  Achieved      PT SHORT TERM GOAL #3   Title  Pt will improve 5x STS from standard height chair with no UE assist to <16 seconds indicating reduction in fall risk and improvement in LE power     Baseline  10/24: 10.43 seconds from standard height chair with no UE assist     Time  3    Period  Weeks    Status  Achieved      PT SHORT TERM GOAL #4   Title  Pt will participate in 6 min walk test with LRAD to assess baseline endurance during functional mobility in preparation for upcoming trip to [place]  10/24: 1374 feet with Hurrycane     Time  3    Period  Weeks    Status  Achieved      PT SHORT TERM GOAL #5   Title  Pt will negotiate 12 steps with single HR and step to pattern technique with CGA to simulate home environment and improve functional mobility     Baseline  10/24: Pt negotiates 12 steps with R HR demonstrating reciprocal stepping with supervision     Time  3    Period  Weeks    Status  Achieved      PT SHORT TERM GOAL #6   Title  Pt will improve participate in  gait speed assessment with LRAD to assess fall risk potential during functional mobility     Baseline  10/24: 4.05 ft/sec with Hurrycane indicative of community ambulation     Time  3    Period  Weeks    Status  Achieved      PT SHORT TERM GOAL #7   Title  Pt will participate in DGI to establish baseline fall risk with dynamic gait     Time  3    Period  Weeks    Status  Achieved      PT SHORT TERM GOAL #8   Title  Pt will ambulate 500 feet in community negotiating uneven surfaces, curbs, inclines, and ability to vary gait speed when cued using LRAD and CGA to improve functional mobility with community distances.     Time  3  Period  Weeks    Status  Achieved        PT Long Term Goals - 03/09/18 1526      PT LONG TERM GOAL #1   Title  Pt will report compliancy and independence with HEP to improve strength, balance, and functional mobility     Time  6    Period  Weeks    Status  New    Target Date  04/03/18      PT LONG TERM GOAL #2   Title  Pt will improve Berg score to >/= 53/56 indicating reduction in fall risk potential     Baseline  10/24: 52/56 indicative of low fall risk potential     Time  6    Period  Weeks    Status  Revised    Target Date  04/03/18      PT LONG TERM GOAL #3   Title  Pt will improve 5x STS time to </= 13 seconds from a standard height chair with no UE assist indicating decreased fall risk potential and improved LE power     Baseline  10/24: Pt performs from standard height chair with no UE assist in 10.43 seconds indicating low fall risk potential     Time  6    Period  Weeks    Status  Achieved      PT LONG TERM GOAL #4   Title  Pt will improve 6 min walk test score by 100 feet using LRAD indicating improvement in endurance and functional mobility     Baseline  10/24: 1374  feet with Hurrycane     Time  6    Period  Weeks    Status  Revised    Target Date  04/03/18      PT LONG TERM GOAL #5   Title  Pt will negotiate 12 steps using no  HR and reciprocal stepping technique, Mod I, indicating improvement in functional mobility and community accessibility.     Baseline  10/24: 12 steps with R HR & reciprocal stepping technique with supervision     Time  6    Period  Weeks    Status  Revised    Target Date  04/03/18      PT LONG TERM GOAL #6   Title  Pt will improve gait speed >/= 4.37 seconds using LRAD indicating improvement in functional mobility and reduction in fall risk     Time  6    Period  Weeks    Status  Revised    Target Date  04/03/18      PT LONG TERM GOAL #7   Title  Pt will ambulate 1000 feet negotiating uneven terrain, curbs, ramps, and ability to change gait speed when cued, Mod I using LRAD, demonstrating improvement in functional mobility with community distances.     Time  6    Period  Weeks    Status  New    Target Date  04/03/18      PT LONG TERM GOAL #8   Title  Pt will score >/= 22/24 on the dynamic gait index indicating reduction in fall risk potential during dynamic gait     Time  6    Period  Weeks    Status  Revised            Plan - 03/09/18 1530    Clinical Impression Statement  Today's skilled session focused on reassessment of patients short term goals.  Overall, patient has met 8/8 short term goals demonstrating significant improvement in balance, strength, and functional mobility. Patient will continue to benefit from skilled PT to address high level balance, strength, and functional mobility deficits to further progress patient towards achieving LTG's.     Rehab Potential  Good    Clinical Impairments Affecting Rehab Potential  Peripheral nerve regeneration time frame and absent sensation to plantar surface of bilateral feet negatively effecting patient's balance    PT Frequency  2x / week    PT Duration  6 weeks    PT Treatment/Interventions  ADLs/Self Care Home Management;Stair training;Functional mobility training;Patient/family education;Therapeutic activities;Therapeutic  exercise;Passive range of motion;Balance training;DME Instruction;Electrical Stimulation;Gait training;Neuromuscular re-education;Manual techniques;Energy conservation    PT Next Visit Plan  quadriped, hip extension with resistance holding ball, half kneeling with core, balance with no AD ,quad eccentrics, rocker board balance- cone tap for LE coordination, cone tape with crossing midline, foam balance, ambulation with cane, hurdles, step ups with dec UE assist, stair negotiation without UE support; work on balance and proprioceptive awareness 2/2 absent plantar sensation bilaterally, corner balance with eyes closed,    PT Home Exercise Plan  [id number]     Consulted and Agree with Plan of Care  Patient       Patient will benefit from skilled therapeutic intervention in order to improve the following deficits and impairments:  Abnormal gait, Decreased coordination, Difficulty walking, Decreased endurance, Impaired UE functional use, Decreased activity tolerance, Decreased balance, Decreased knowledge of use of DME, Decreased mobility, Decreased strength, Impaired sensation  Visit Diagnosis: Unsteadiness on feet  Other abnormalities of gait and mobility  Muscle weakness (generalized)     Problem List Patient Active Problem List   Diagnosis Date Noted  . 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 GBS (Guillain Barre syndrome) ([place]) 02/09/2018  . Guillain Barr syndrome ([place])   . Dyslipidemia   . Benign essential HTN   . Prediabetes   . Hyponatremia   . Ascending paralysis ([place]) 02/03/2018  . Hyperglycemia 02/03/2018  . Primary localized osteoarthritis of right knee 06/09/2015  . DJD (degenerative joint disease) of knee 06/09/2015  . Preoperative clearance 07/06/2014  . CAD (coronary artery disease) 06/14/2013  . Familial hyperlipidemia, high LDL 06/14/2013  . Essential hypertension 06/14/2013  . Melanoma ([place]) 06/14/2013    Floreen Comber, SPT 03/09/2018, 7:50 PM  [facility] [address] [place], [place], [address] Phone: [phone]   Fax:  [phone]  Name: Henry Morrison Date of Birth: 1946-05-08

## 2018-03-09 NOTE — Telephone Encounter (Signed)
Patient left a message asking if it is okay to take melatonin considering his dx of Guillan Barr Syndrome. I spoke with Dr. Posey Pronto face to face. He said it will be okay. I contacted the patient.

## 2018-03-12 ENCOUNTER — Telehealth: Payer: Self-pay | Admitting: Pharmacist Clinician (PhC)/ Clinical Pharmacy Specialist

## 2018-03-12 DIAGNOSIS — E7849 Other hyperlipidemia: Secondary | ICD-10-CM

## 2018-03-12 DIAGNOSIS — E785 Hyperlipidemia, unspecified: Secondary | ICD-10-CM

## 2018-03-12 NOTE — Telephone Encounter (Signed)
Labs ordered for Repatha PA

## 2018-03-14 ENCOUNTER — Encounter: Payer: Self-pay | Admitting: Physical Therapy

## 2018-03-14 ENCOUNTER — Ambulatory Visit: Payer: Medicare Other | Admitting: Physical Therapy

## 2018-03-14 DIAGNOSIS — M6281 Muscle weakness (generalized): Secondary | ICD-10-CM

## 2018-03-14 DIAGNOSIS — R2681 Unsteadiness on feet: Secondary | ICD-10-CM

## 2018-03-14 NOTE — Patient Instructions (Addendum)
Access Code: [email or web address]  URL: [email or web address]  Date: 03/14/2018  Prepared by: [place] - 10 reps - 2 sets - 1x daily - 7x weekly  Standing Hip Extension and Flexion with Resistance - 10 reps - 3 sets - 1x daily - 7x weekly  Side Stepping with Resistance at Feet - 10 reps - 3 sets - 1x daily - 7x weekly  Wall Squat Hold with Ball - 10 reps - 2 sets - 1x daily - 7x weekly  Seated Hamstring Stretch with Strap - 3 sets - 30-60 hold - 1x daily - 7x weekly  Romberg Stance - 3 sets - 30 hold - 1x daily - 7x weekly  Romberg Stance with Head Rotation - 3 sets - 30 hold - 1x daily - 7x weekly  Romberg Stance with Head Nods - 3 sets - 30 hold - 1x daily - 7x weekly  Wide Stance with Eyes Closed on Foam Pad - 3 sets - 30 hold - 1x daily - 7x weekly  Quadruped Hip Abduction with Resistance Loop - 10 reps - 2 sets - 1x daily - 5x weekly  Beginner Front Arm Support - 10 reps - 3 sets - 1x daily - 7x weekly  Gastroc Stretch on Wall - 3 reps - 1 sets - 30 hold - 1x daily - 7x weekly

## 2018-03-14 NOTE — Therapy (Signed)
[facility] [address] [place], [place], [address] Phone: [phone]   Fax:  [phone]  Physical Therapy Treatment  Patient Details  Name: Henry Morrison MRN: [id number] Date of Birth: [birth year] Referring Provider (PT): Delice Lesch, MD   Encounter Date: 03/14/2018  PT End of Session - 03/14/18 1609    Visit Number  5    Number of Visits  13    Date for PT Re-Evaluation  04/03/18    Authorization Type  [employer]- *requires 10th visit PN    PT Start Time  9417    PT Stop Time  1530    PT Time Calculation (min)  45 min    Equipment Utilized During Treatment  Gait belt    Activity Tolerance  Patient tolerated treatment well    Behavior During Therapy  [facility] for tasks assessed/performed       Past Medical History:  Diagnosis Date  . Arthritis   . CAD (coronary artery disease)   . Cancer ([place])    skin cancer   . Hyperlipidemia   . Primary localized osteoarthritis of right knee 06/09/2015    Past Surgical History:  Procedure Laterality Date  . COLONOSCOPY    . CORONARY ANGIOPLASTY WITH STENT PLACEMENT  10/2000   stenting of the CX vessel  . KNEE ARTHROSCOPY W/ MENISCECTOMY Left 07/15/2006  . KNEE ARTHROSCOPY W/ MENISCECTOMY Right 02/09/2012  . removal of skin cancer     right arm  . TOTAL KNEE ARTHROPLASTY Right 06/09/2015   Procedure: RIGHT TOTAL KNEE ARTHROPLASTY;  Surgeon: Elsie Saas, MD;  Location: [facility];  Service: Orthopedics;  Laterality: Right;  . US ECHOCARDIOGRAPHY  03/07/2012   Trace AI,mild MR,mild to mod TR    There were no vitals filed for this visit.  Subjective Assessment - 03/14/18 1450    Subjective  Pt reports he is doing well and continues to perform HEP exercises at home. Reports not sleeping well because he has an "itching sensation" to back region that does not go away with scratching leading him to have increased frustration.     Patient is accompained by:  Family  member   Wife [name]   Pertinent History   CAD with stenting, skin cancer, HLD, HTN, R knee OA    Limitations  Standing;Walking;Lifting    Patient Stated Goals  Reports goal of improving functional mobility in 6 weeks in preparation for family trip to [place].     Currently in Pain?  No/denies        Skilled Physical Therapy Intervention:  Patient verbalized and demonstrated understanding of the below updated HEP exercises with proper technique to improve independence with functional mobility.    Exercises:    Quadruped Hip Abduction with Resistance Loop - 10 reps - 2 sets - 1x daily - 5x weekly  Beginner Front Arm Support - 10 reps - 3 sets - 1x daily - 7x weekly  Gastroc Stretch on Wall - 3 reps - 1 sets - 30 hold - 1x daily - 7x weekly         [facility] Adult PT Treatment/Exercise - 03/14/18 1604      Balance   Balance Assessed  Yes      Dynamic Standing Balance   Compliant surfaces comments:  Pt performs standing balance on bosu ball for multiple trials consisting of 30 second durations in // bars for increased challenge. Pt performs: feet apart eyes open; feet apart with horizontal head turns;  feet apart with vertical head turns; feet apart with eyes closed. Patient demonstrates increased challenge performing standing balance with eyes closed 2/2 increase vestibular challenge.     Dynamic Standing - Comments  Pt performs cone tapping in SLS to increase glut med activation for improved pelvic stability. Pt performs single cone tapping, double cone tapping, and cone tapping while crossing midline for multiple trials with each LE. Therapist provided CGA throughout activity duration for safety consideration.              PT Education - 03/14/18 1609    Education Details  Therapist provided education regarding updated strengthening HEP exercises with patient verbalizing and demonstrating understanding.    Person(s) Educated  Patient    Methods  Explanation;Demonstration     Comprehension  Verbalized understanding;Returned demonstration       PT Short Term Goals - 03/09/18 1522      PT SHORT TERM GOAL #1   Title  Pt will participate in the initiation of an HEP to improve LE strength and balance to promote increased independence with functional mobility    Time  3    Period  Weeks    Status  Achieved      PT SHORT TERM GOAL #2   Title  Pt will improve Berg score to >/= 43/56 indicating decreased fall risk potential     Baseline  10/24: 52/56 indicative of low fall risk potential     Time  3    Period  Weeks    Status  Achieved      PT SHORT TERM GOAL #3   Title  Pt will improve 5x STS from standard height chair with no UE assist to <16 seconds indicating reduction in fall risk and improvement in LE power     Baseline  10/24: 10.43 seconds from standard height chair with no UE assist     Time  3    Period  Weeks    Status  Achieved      PT SHORT TERM GOAL #4   Title  Pt will participate in 6 min walk test with LRAD to assess baseline endurance during functional mobility in preparation for upcoming trip to [place]  10/24: 1374 feet with Hurrycane     Time  3    Period  Weeks    Status  Achieved      PT SHORT TERM GOAL #5   Title  Pt will negotiate 12 steps with single HR and step to pattern technique with CGA to simulate home environment and improve functional mobility     Baseline  10/24: Pt negotiates 12 steps with R HR demonstrating reciprocal stepping with supervision     Time  3    Period  Weeks    Status  Achieved      PT SHORT TERM GOAL #6   Title  Pt will improve participate in gait speed assessment with LRAD to assess fall risk potential during functional mobility     Baseline  10/24: 4.05 ft/sec with Hurrycane indicative of community ambulation     Time  3    Period  Weeks    Status  Achieved      PT SHORT TERM GOAL #7   Title  Pt will participate in DGI to establish baseline fall risk with dynamic gait     Time  3     Period  Weeks    Status  Achieved  PT SHORT TERM GOAL #8   Title  Pt will ambulate 500 feet in community negotiating uneven surfaces, curbs, inclines, and ability to vary gait speed when cued using LRAD and CGA to improve functional mobility with community distances.     Time  3    Period  Weeks    Status  Achieved        PT Long Term Goals - 03/09/18 1526      PT LONG TERM GOAL #1   Title  Pt will report compliancy and independence with HEP to improve strength, balance, and functional mobility     Time  6    Period  Weeks    Status  New    Target Date  04/03/18      PT LONG TERM GOAL #2   Title  Pt will improve Berg score to >/= 53/56 indicating reduction in fall risk potential     Baseline  10/24: 52/56 indicative of low fall risk potential     Time  6    Period  Weeks    Status  Revised    Target Date  04/03/18      PT LONG TERM GOAL #3   Title  Pt will improve 5x STS time to </= 13 seconds from a standard height chair with no UE assist indicating decreased fall risk potential and improved LE power     Baseline  10/24: Pt performs from standard height chair with no UE assist in 10.43 seconds indicating low fall risk potential     Time  6    Period  Weeks    Status  Achieved      PT LONG TERM GOAL #4   Title  Pt will improve 6 min walk test score by 100 feet using LRAD indicating improvement in endurance and functional mobility     Baseline  10/24: 1374  feet with Hurrycane     Time  6    Period  Weeks    Status  Revised    Target Date  04/03/18      PT LONG TERM GOAL #5   Title  Pt will negotiate 12 steps using no HR and reciprocal stepping technique, Mod I, indicating improvement in functional mobility and community accessibility.     Baseline  10/24: 12 steps with R HR & reciprocal stepping technique with supervision     Time  6    Period  Weeks    Status  Revised    Target Date  04/03/18      PT LONG TERM GOAL #6   Title  Pt will improve gait speed >/=  4.37 seconds using LRAD indicating improvement in functional mobility and reduction in fall risk     Time  6    Period  Weeks    Status  Revised    Target Date  04/03/18      PT LONG TERM GOAL #7   Title  Pt will ambulate 1000 feet negotiating uneven terrain, curbs, ramps, and ability to change gait speed when cued, Mod I using LRAD, demonstrating improvement in functional mobility with community distances.     Time  6    Period  Weeks    Status  New    Target Date  04/03/18      PT LONG TERM GOAL #8   Title  Pt will score >/= 22/24 on the dynamic gait index indicating reduction in fall risk potential during dynamic gait  Time  6    Period  Weeks    Status  Revised            Plan - 03/14/18 1507    Clinical Impression Statement  Today's skilled session focused LE strengthening and dynamic standing balance. Pt tolerated session well indicated by ability to perform quadriped LE strengthening exercises with band resistance demonstrating proper technique. Pt continues to demonstrate increased challenge performing SLS with contralateral LE performing cone tapping; however, patient demonstrates good utilization of stepping strategy to recover from LOB episdoes. Patient will continue to benefit from skilled PT to address LE strength, balance and functional mobility deficits.     Rehab Potential  Good    Clinical Impairments Affecting Rehab Potential  Peripheral nerve regeneration time frame and absent sensation to plantar surface of bilateral feet negatively effecting patient's balance    PT Frequency  2x / week    PT Duration  6 weeks    PT Treatment/Interventions  ADLs/Self Care Home Management;Stair training;Functional mobility training;Patient/family education;Therapeutic activities;Therapeutic exercise;Passive range of motion;Balance training;DME Instruction;Electrical Stimulation;Gait training;Neuromuscular re-education;Manual techniques;Energy conservation    PT Next Visit Plan   , half kneeling with core, balance with no AD ,quad eccentrics, rocker board balance- cone tap for LE coordination, cone tape with crossing midline, foam balance, ambulation with cane, hurdles, step ups with dec UE assist, stair negotiation without UE support; work on balance and proprioceptive awareness 2/2 absent plantar sensation bilaterally, corner balance with eyes closed, physioball core stabilization with coordination,    PT Home Exercise Plan  [id number]     Consulted and Agree with Plan of Care  Patient       Patient will benefit from skilled therapeutic intervention in order to improve the following deficits and impairments:  Abnormal gait, Decreased coordination, Difficulty walking, Decreased endurance, Impaired UE functional use, Decreased activity tolerance, Decreased balance, Decreased knowledge of use of DME, Decreased mobility, Decreased strength, Impaired sensation  Visit Diagnosis: Unsteadiness on feet  Muscle weakness (generalized)     Problem List Patient Active Problem List   Diagnosis Date Noted  . 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 GBS (Guillain Barre syndrome) ([place]) 02/09/2018  . Guillain Barr syndrome ([place])   . Dyslipidemia   . Benign essential HTN   . Prediabetes   . Hyponatremia   . Ascending paralysis ([place]) 02/03/2018  . Hyperglycemia 02/03/2018  . Primary localized osteoarthritis of right knee 06/09/2015  . DJD (degenerative joint disease) of knee 06/09/2015  . Preoperative clearance 07/06/2014  . CAD (coronary artery disease) 06/14/2013  . Familial hyperlipidemia, high LDL 06/14/2013  . Essential hypertension 06/14/2013  . Melanoma ([place]) 06/14/2013    Floreen Comber, SPT 03/14/2018, 4:13 PM  [facility] [address] [place], [place], [address] Phone: [phone]   Fax:  [phone]  Name: Henry Morrison MRN: [id number] Date of Birth: [birth year]

## 2018-03-16 ENCOUNTER — Ambulatory Visit: Payer: Medicare Other | Admitting: Physical Therapy

## 2018-03-16 ENCOUNTER — Encounter: Payer: Self-pay | Admitting: Physical Therapy

## 2018-03-16 DIAGNOSIS — R2681 Unsteadiness on feet: Secondary | ICD-10-CM

## 2018-03-16 DIAGNOSIS — M6281 Muscle weakness (generalized): Secondary | ICD-10-CM

## 2018-03-16 NOTE — Therapy (Signed)
[facility] [address] [place], [place], [address] Phone: [phone]   Fax:  [phone]  Physical Therapy Treatment  Patient Details  Name: Henry Morrison MRN: [id number] Date of Birth: [birth year] Referring Provider (PT): Delice Lesch, MD   Encounter Date: 03/16/2018  PT End of Session - 03/16/18 1551    Visit Number  6    Number of Visits  13    Date for PT Re-Evaluation  04/03/18    Authorization Type  [employer]- *requires 10th visit PN    PT Start Time  6468    PT Stop Time  1530    PT Time Calculation (min)  43 min    Equipment Utilized During Treatment  Gait belt    Activity Tolerance  Patient tolerated treatment well    Behavior During Therapy  [facility] for tasks assessed/performed       Past Medical History:  Diagnosis Date  . Arthritis   . CAD (coronary artery disease)   . Cancer ([place])    skin cancer   . Hyperlipidemia   . Primary localized osteoarthritis of right knee 06/09/2015    Past Surgical History:  Procedure Laterality Date  . COLONOSCOPY    . CORONARY ANGIOPLASTY WITH STENT PLACEMENT  10/2000   stenting of the CX vessel  . KNEE ARTHROSCOPY W/ MENISCECTOMY Left 07/15/2006  . KNEE ARTHROSCOPY W/ MENISCECTOMY Right 02/09/2012  . removal of skin cancer     right arm  . TOTAL KNEE ARTHROPLASTY Right 06/09/2015   Procedure: RIGHT TOTAL KNEE ARTHROPLASTY;  Surgeon: Elsie Saas, MD;  Location: [facility];  Service: Orthopedics;  Laterality: Right;  . US ECHOCARDIOGRAPHY  03/07/2012   Trace AI,mild MR,mild to mod TR    There were no vitals filed for this visit.  Subjective Assessment - 03/16/18 1449    Subjective  Reports he is feeling well today and was not as sore s/p his previous PT appt. Pt reports he would like to see a neurologist 2/2 to observable skin changes and abnormal sensation.    Patient is accompained by:  --   Wife [name]   Pertinent History   CAD with stenting, skin  cancer, HLD, HTN, R knee OA    Limitations  Standing;Walking;Lifting    Patient Stated Goals  Reports goal of improving functional mobility in 6 weeks in preparation for family trip to [place].     Currently in Pain?  No/denies         [facility] Adult PT Treatment/Exercise - 03/16/18 1538      Balance   Balance Assessed  Yes      Dynamic Standing Balance   Rocker board comments:  Pt performs dynamic standing balance on rocker board with incorporation of cone tap progressions. Pt performs alternating forward cone tapping for 10 reps; double tapping cones for 10 reps; crossing midline while performing cone tapping for 10 reps; double tapping cones while crossing midline for 10 reps; and alternating knocking over cones and bringing them back upright to increase challenge with motor coordination. Pt demonstrates most challenge with last exercise requiring intermittant UE assist. Pt progresses to alternating forward stepping off rocker board for x10 reps progressing to backwards stepping for x10 reps. Therapist cues patient to take bigger steps and perform initial contact with heel strike. Pt progresses to backwards stepping into forwards stepping with return to standing on rocker board. Pt progresses to performing alternative "swing through" on rocker board while holding 2# weighted  ball to increase challenge by preventing UE's from being maintained in mid guard position.     Dynamic Standing - Comments  Pt performs dynamic standing balance on bosu ball in // bars for intermittant UE assist. Pt performs following exercises for 30 second duration; feet apart eyes open; feet apart horizontal head turns; feet apart vertical head turns; feet apart with eyes closed for x2 trials. Pt progresses to feet apart with eyes open performing chest press with 2# wt ball for x10 reps. Pt then performs x10 reps of diagonal "chops" maintaining visual gaze on ball through diagonal movement.       Exercises   Exercises   Knee/Hip;Other Exercises    Other Exercises   Pt performs 2x12 reps of mini squats on bosu ball for increased challenge with  LE strengthening       Knee/Hip Exercises: Standing   Other Standing Knee Exercises  Pt performs eccentric quad exercise with each LE for 2x10 reps using 4 inch step. Pt requires standing rest break in between each set with intermittant use of UE's on // bars for steadying.             PT Education - 03/16/18 1550    Education Details  Therapist provides patient education to continue performing HEP at home and LE stretching each night to reduce muscle soreness the following morning.     Person(s) Educated  Patient    Methods  Explanation    Comprehension  Verbalized understanding       PT Short Term Goals - 03/09/18 1522      PT SHORT TERM GOAL #1   Title  Pt will participate in the initiation of an HEP to improve LE strength and balance to promote increased independence with functional mobility    Time  3    Period  Weeks    Status  Achieved      PT SHORT TERM GOAL #2   Title  Pt will improve Berg score to >/= 43/56 indicating decreased fall risk potential     Baseline  10/24: 52/56 indicative of low fall risk potential     Time  3    Period  Weeks    Status  Achieved      PT SHORT TERM GOAL #3   Title  Pt will improve 5x STS from standard height chair with no UE assist to <16 seconds indicating reduction in fall risk and improvement in LE power     Baseline  10/24: 10.43 seconds from standard height chair with no UE assist     Time  3    Period  Weeks    Status  Achieved      PT SHORT TERM GOAL #4   Title  Pt will participate in 6 min walk test with LRAD to assess baseline endurance during functional mobility in preparation for upcoming trip to [place]  10/24: 1374 feet with Hurrycane     Time  3    Period  Weeks    Status  Achieved      PT SHORT TERM GOAL #5   Title  Pt will negotiate 12 steps with single HR and step to pattern  technique with CGA to simulate home environment and improve functional mobility     Baseline  10/24: Pt negotiates 12 steps with R HR demonstrating reciprocal stepping with supervision     Time  3    Period  Weeks    Status  Achieved      PT SHORT TERM GOAL #6   Title  Pt will improve participate in gait speed assessment with LRAD to assess fall risk potential during functional mobility     Baseline  10/24: 4.05 ft/sec with Hurrycane indicative of community ambulation     Time  3    Period  Weeks    Status  Achieved      PT SHORT TERM GOAL #7   Title  Pt will participate in DGI to establish baseline fall risk with dynamic gait     Time  3    Period  Weeks    Status  Achieved      PT SHORT TERM GOAL #8   Title  Pt will ambulate 500 feet in community negotiating uneven surfaces, curbs, inclines, and ability to vary gait speed when cued using LRAD and CGA to improve functional mobility with community distances.     Time  3    Period  Weeks    Status  Achieved        PT Long Term Goals - 03/09/18 1526      PT LONG TERM GOAL #1   Title  Pt will report compliancy and independence with HEP to improve strength, balance, and functional mobility     Time  6    Period  Weeks    Status  New    Target Date  04/03/18      PT LONG TERM GOAL #2   Title  Pt will improve Berg score to >/= 53/56 indicating reduction in fall risk potential     Baseline  10/24: 52/56 indicative of low fall risk potential     Time  6    Period  Weeks    Status  Revised    Target Date  04/03/18      PT LONG TERM GOAL #3   Title  Pt will improve 5x STS time to </= 13 seconds from a standard height chair with no UE assist indicating decreased fall risk potential and improved LE power     Baseline  10/24: Pt performs from standard height chair with no UE assist in 10.43 seconds indicating low fall risk potential     Time  6    Period  Weeks    Status  Achieved      PT LONG TERM GOAL #4   Title  Pt will  improve 6 min walk test score by 100 feet using LRAD indicating improvement in endurance and functional mobility     Baseline  10/24: 1374  feet with Hurrycane     Time  6    Period  Weeks    Status  Revised    Target Date  04/03/18      PT LONG TERM GOAL #5   Title  Pt will negotiate 12 steps using no HR and reciprocal stepping technique, Mod I, indicating improvement in functional mobility and community accessibility.     Baseline  10/24: 12 steps with R HR & reciprocal stepping technique with supervision     Time  6    Period  Weeks    Status  Revised    Target Date  04/03/18      PT LONG TERM GOAL #6   Title  Pt will improve gait speed >/= 4.37 seconds using LRAD indicating improvement in functional mobility and reduction in fall risk     Time  6    Period  Weeks  Status  Revised    Target Date  04/03/18      PT LONG TERM GOAL #7   Title  Pt will ambulate 1000 feet negotiating uneven terrain, curbs, ramps, and ability to change gait speed when cued, Mod I using LRAD, demonstrating improvement in functional mobility with community distances.     Time  6    Period  Weeks    Status  New    Target Date  04/03/18      PT LONG TERM GOAL #8   Title  Pt will score >/= 22/24 on the dynamic gait index indicating reduction in fall risk potential during dynamic gait     Time  6    Period  Weeks    Status  Revised            Plan - 03/16/18 1551    Clinical Impression Statement  Today's skilled session focused on dynamic standing balance in // bars and LE strengthening. Patient tolerated session well indicated by ability to demonstrate high level balance progressions with increased challenge. Patient demonstrates difficulty performing balance exercises on compliant surface with eyes closed 2/2 to increased challenge on vestibular system. Pt will continue to benefit from skilled PT to address high level balance and strengthening deficits to improve patient's independence with  functional mobility.      Rehab Potential  Good    Clinical Impairments Affecting Rehab Potential  Peripheral nerve regeneration time frame and absent sensation to plantar surface of bilateral feet negatively effecting patient's balance    PT Frequency  2x / week    PT Duration  6 weeks    PT Treatment/Interventions  ADLs/Self Care Home Management;Stair training;Functional mobility training;Patient/family education;Therapeutic activities;Therapeutic exercise;Passive range of motion;Balance training;DME Instruction;Electrical Stimulation;Gait training;Neuromuscular re-education;Manual techniques;Energy conservation    PT Next Visit Plan  half kneeling with core, balance with no AD gait ,quad eccentrics with ball roll outs, foam balance with cone tapping progressing to band resistance, ambulation with cane, hurdles, step ups with dec UE assist and band resistance for time, stair negotiation without UE support; , physioball core stabilization with coordination,    PT Home Exercise Plan  [id number]     Consulted and Agree with Plan of Care  Patient       Patient will benefit from skilled therapeutic intervention in order to improve the following deficits and impairments:  Abnormal gait, Decreased coordination, Difficulty walking, Decreased endurance, Impaired UE functional use, Decreased activity tolerance, Decreased balance, Decreased knowledge of use of DME, Decreased mobility, Decreased strength, Impaired sensation  Visit Diagnosis: Muscle weakness (generalized)  Unsteadiness on feet     Problem List Patient Active Problem List   Diagnosis Date Noted  . 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 GBS (Guillain Barre syndrome) ([place]) 02/09/2018  . Guillain Barr syndrome ([place])   . Dyslipidemia   . Benign essential HTN   . Prediabetes   . Hyponatremia   . Ascending paralysis ([place]) 02/03/2018  . Hyperglycemia 02/03/2018  . Primary localized osteoarthritis of right knee 06/09/2015  . DJD  (degenerative joint disease) of knee 06/09/2015  . Preoperative clearance 07/06/2014  . CAD (coronary artery disease) 06/14/2013  . Familial hyperlipidemia, high LDL 06/14/2013  . Essential hypertension 06/14/2013  . Melanoma ([place]) 06/14/2013    Floreen Comber, SPT 03/16/2018, 3:54 PM  [facility] [address] [place], [place], [address] Phone: [phone]   Fax:  [phone]  Name: RYON LAYTON MRN: [id number] Date of  Birth: [birth year]

## 2018-03-20 ENCOUNTER — Ambulatory Visit: Payer: Medicare Other | Admitting: Rehabilitative and Restorative Service Providers"

## 2018-03-21 ENCOUNTER — Ambulatory Visit (INDEPENDENT_AMBULATORY_CARE_PROVIDER_SITE_OTHER): Payer: Medicare Other | Admitting: Neurology

## 2018-03-21 ENCOUNTER — Encounter: Payer: Self-pay | Admitting: Neurology

## 2018-03-21 ENCOUNTER — Other Ambulatory Visit: Payer: Self-pay

## 2018-03-21 VITALS — BP 116/78 | HR 66 | Resp 18 | Ht 71.0 in | Wt 194.0 lb

## 2018-03-21 DIAGNOSIS — G610 Guillain-Barre syndrome: Secondary | ICD-10-CM

## 2018-03-21 NOTE — Progress Notes (Signed)
Reason for visit: Guillain-Barr syndrome  Referring physician: Dr. Kem Kays is a 72 y.o. 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  The patient had a flu shot and the next day he noted some tingling in the feet, on the second day he noted tingling on all 4 extremities.  Within a week after the injection, the patient was no longer able to ambulate.  The patient was admitted to the hospital, he underwent a 5-day course of IVIG, he underwent lumbar puncture that did show a modest elevation in the spinal fluid protein, no inflammatory cells were seen.  The patient has undergone inpatient rehab and then outpatient rehab and has done quite well.  The patient still has some residual numbness in the hands and feet, he has difficulty buttoning buttons, he has mild gait instability.  The patient has not had any falls.  He denies problems with breathing or problems with swallowing or choking.  The patient does have a mild tremor on the left greater than right hand.  This predated the illness above.  The patient feels almost back to normal at this point.  He comes to this office for an evaluation.  Past Medical History:  Diagnosis Date  . Arthritis   . CAD (coronary artery disease)   . Cancer ([place])    skin cancer   . Hyperlipidemia   . Primary localized osteoarthritis of right knee 06/09/2015    Past Surgical History:  Procedure Laterality Date  . COLONOSCOPY    . CORONARY ANGIOPLASTY WITH STENT PLACEMENT  10/2000   stenting of the CX vessel  . KNEE ARTHROSCOPY W/ MENISCECTOMY Left 07/15/2006  . KNEE ARTHROSCOPY W/ MENISCECTOMY Right 02/09/2012  . removal of skin cancer     right arm  . TOTAL KNEE ARTHROPLASTY Right 06/09/2015   Procedure: RIGHT TOTAL KNEE ARTHROPLASTY;  Surgeon: Elsie Saas, MD;  Location: [facility];  Service: Orthopedics;   Laterality: Right;  . US ECHOCARDIOGRAPHY  03/07/2012   Trace AI,mild MR,mild to mod TR    Family History  Problem Relation Age of Onset  . Heart disease Mother   . Cancer Father        kidney    Social history:  reports that he has never smoked. He has never used smokeless tobacco. He reports that he drinks about 10.0 standard drinks of alcohol per week. He reports that he does not use drugs.  Medications:  Prior to Admission medications   Medication Sig Start Date End Date Taking? Authorizing Provider  acetaminophen (TYLENOL) 325 MG tablet Take 2 tablets (650 mg total) by mouth every 6 (six) hours as needed for mild pain, fever or headache. 02/09/18  Yes Elgergawy, Silver Huguenin, MD  atorvastatin (LIPITOR) 80 MG tablet TAKE 1 TABLET BY MOUTH ONCE DAILY 03/02/18  Yes Troy Sine, MD  Evolocumab (REPATHA SURECLICK) 124 MG/ML SOAJ Inject 140 mg into the skin every 14 (fourteen) days. 06/06/17  Yes Troy Sine, MD  ezetimibe (ZETIA) 10 MG tablet TAKE 1 TABLET BY MOUTH ONCE DAILY 03/02/18  Yes Troy Sine, MD  fluticasone [facility]) 50 MCG/ACT nasal spray Place 1 spray into both nostrils as needed for allergies.  05/24/13  Yes [provider]  isosorbide mononitrate (IMDUR) 30 MG 24 hr tablet TAKE 1 TABLET BY MOUTH ONCE DAILY 03/02/18  Yes Shelva Majestic  A, MD  metoprolol succinate (TOPROL-XL) 25 MG 24 hr tablet TAKE 1 TABLET BY MOUTH ONCE DAILY 03/02/18  Yes Troy Sine, MD  Multiple Vitamin (MULTIVITAMIN) tablet Take 1 tablet by mouth daily.   Yes [provider]  ramipril (ALTACE) 10 MG capsule TAKE 1 CAPSULE BY MOUTH ONCE DAILY 03/02/18  Yes Troy Sine, MD      Allergies  Allergen Reactions  . Influenza Vaccines     Guillain-Barr  . Other     Seasonal allergies    ROS:  Out of a complete 14 system review of symptoms, the patient complains only of the following symptoms, and all other reviewed systems are negative.  Weakness,  numbness Sleepiness Skin rash, itching  Blood pressure 116/78, pulse 66, resp. rate 18, height 5\' 11"  (1.803 m), weight 194 lb (88 kg).  Physical Exam  General: The patient is alert and cooperative at the time of the examination.  Eyes: Pupils are equal, round, and reactive to light. Discs are flat bilaterally.  Neck: The neck is supple, no carotid bruits are noted.  Respiratory: The respiratory examination is clear.  Cardiovascular: The cardiovascular examination reveals a regular rate and rhythm, no obvious murmurs or rubs are noted.  Skin: Extremities are without significant edema.  Neurologic Exam  Mental status: The patient is alert and oriented x 3 at the time of the examination. The patient has apparent normal recent and remote memory, with an apparently normal attention span and concentration ability.  Cranial nerves: Facial symmetry is present. There is good sensation of the face to pinprick and soft touch bilaterally. The strength of the facial muscles and the muscles to head turning and shoulder shrug are normal bilaterally. Speech is well enunciated, no aphasia or dysarthria is noted. Extraocular movements are full. Visual fields are full. The tongue is midline, and the patient has symmetric elevation of the soft palate. No obvious hearing deficits are noted.  Motor: The motor testing reveals 5 over 5 strength of all 4 extremities. Good symmetric motor tone is noted throughout.  Sensory: Sensory testing is intact to pinprick, soft touch, vibration sensation, and position sense on the upper extremities.  The patient has a stocking pattern pinprick sensory deficit in the distal third of the legs bilaterally, significant reduction in vibration and position sense are noted bilaterally in the feet.  No evidence of extinction is noted.  Coordination: Cerebellar testing reveals good finger-nose-finger and heel-to-shin bilaterally.  Gait and station: Gait is normal. Tandem gait is  slightly unsteady. Romberg is negative. No drift is seen.  Reflexes: Deep tendon reflexes are symmetric, but are absent bilaterally. Toes are downgoing bilaterally.   Assessment/Plan:  1.  Guillain-Barr syndrome  The patient is doing quite well, he is recovering well from the neuropathy.  He should be able to regain full function in a few months.  He may have developed a rash in the palms and on the back from the IVIG.  The patient is not to receive another flu shot in the future.  He will follow-up here if needed.  Jill Alexanders MD 03/21/2018 2:56 PM  [facility] [address] [place], [place] [address]  Phone [phone] Fax [phone]

## 2018-03-23 ENCOUNTER — Encounter: Payer: Self-pay | Admitting: Rehabilitative and Restorative Service Providers"

## 2018-03-23 ENCOUNTER — Encounter: Payer: Self-pay | Admitting: Physical Medicine & Rehabilitation

## 2018-03-23 ENCOUNTER — Ambulatory Visit: Payer: Medicare Other | Attending: Pediatrics | Admitting: Rehabilitative and Restorative Service Providers"

## 2018-03-23 ENCOUNTER — Encounter: Payer: Medicare Other | Attending: Physical Medicine & Rehabilitation | Admitting: Physical Medicine & Rehabilitation

## 2018-03-23 VITALS — BP 146/68 | HR 60 | Ht 71.0 in | Wt 197.0 lb

## 2018-03-23 DIAGNOSIS — Z96651 Presence of right artificial knee joint: Secondary | ICD-10-CM | POA: Insufficient documentation

## 2018-03-23 DIAGNOSIS — R269 Unspecified abnormalities of gait and mobility: Secondary | ICD-10-CM | POA: Diagnosis not present

## 2018-03-23 DIAGNOSIS — I2510 Atherosclerotic heart disease of native coronary artery without angina pectoris: Secondary | ICD-10-CM | POA: Insufficient documentation

## 2018-03-23 DIAGNOSIS — M6281 Muscle weakness (generalized): Secondary | ICD-10-CM | POA: Diagnosis not present

## 2018-03-23 DIAGNOSIS — E785 Hyperlipidemia, unspecified: Secondary | ICD-10-CM | POA: Insufficient documentation

## 2018-03-23 DIAGNOSIS — Z8249 Family history of ischemic heart disease and other diseases of the circulatory system: Secondary | ICD-10-CM | POA: Diagnosis not present

## 2018-03-23 DIAGNOSIS — M1711 Unilateral primary osteoarthritis, right knee: Secondary | ICD-10-CM | POA: Diagnosis not present

## 2018-03-23 DIAGNOSIS — R2681 Unsteadiness on feet: Secondary | ICD-10-CM | POA: Insufficient documentation

## 2018-03-23 DIAGNOSIS — R2689 Other abnormalities of gait and mobility: Secondary | ICD-10-CM | POA: Diagnosis present

## 2018-03-23 DIAGNOSIS — R208 Other disturbances of skin sensation: Secondary | ICD-10-CM | POA: Insufficient documentation

## 2018-03-23 DIAGNOSIS — I10 Essential (primary) hypertension: Secondary | ICD-10-CM | POA: Insufficient documentation

## 2018-03-23 DIAGNOSIS — G610 Guillain-Barre syndrome: Secondary | ICD-10-CM | POA: Diagnosis not present

## 2018-03-23 NOTE — Therapy (Signed)
[facility] [address] [name], [place], [address] Phone: [phone]   Fax:  [phone]  Physical Therapy Treatment  Patient Details  Name: Henry Morrison MRN: [id number] Date of Birth: 1945-06-17 Referring Provider (PT): Delice Lesch, MD   Encounter Date: 03/23/2018  PT End of Session - 03/23/18 1611    Visit Number  7    Number of Visits  13    Date for PT Re-Evaluation  04/03/18    Authorization Type  [employer]- *requires 10th visit PN    PT Start Time  1400    PT Stop Time  1445    PT Time Calculation (min)  45 min    Equipment Utilized During Treatment  Gait belt    Activity Tolerance  Patient tolerated treatment well    Behavior During Therapy  [facility] for tasks assessed/performed       Past Medical History:  Diagnosis Date  . Arthritis   . CAD (coronary artery disease)   . Cancer ([place])    skin cancer   . Hyperlipidemia   . Primary localized osteoarthritis of right knee 06/09/2015    Past Surgical History:  Procedure Laterality Date  . COLONOSCOPY    . CORONARY ANGIOPLASTY WITH STENT PLACEMENT  10/2000   stenting of the CX vessel  . KNEE ARTHROSCOPY W/ MENISCECTOMY Left 07/15/2006  . KNEE ARTHROSCOPY W/ MENISCECTOMY Right 02/09/2012  . removal of skin cancer     right arm  . TOTAL KNEE ARTHROPLASTY Right 06/09/2015   Procedure: RIGHT TOTAL KNEE ARTHROPLASTY;  Surgeon: Elsie Saas, MD;  Location: [facility];  Service: Orthopedics;  Laterality: Right;  . US ECHOCARDIOGRAPHY  03/07/2012   Trace AI,mild MR,mild to mod TR    There were no vitals filed for this visit.  Subjective Assessment - 03/23/18 1405    Subjective  Reports he had a follow up appt. with his neurologist and reports his neurologist is pleased with his progress and improvement in functional mobility. Reports ability to now perform smaller buttons indicating improvement with fine motor skills. Patient ambulates into the clinic  today without his [place].    Patient is accompained by:  --   Wife [name]   Pertinent History   CAD with stenting, skin cancer, HLD, HTN, R knee OA    Limitations  Standing;Walking;Lifting    Patient Stated Goals  Reports goal of improving functional mobility in 6 weeks in preparation for family trip to [place].     Currently in Pain?  No/denies       [facility] Adult PT Treatment/Exercise - 03/23/18 1603      Ambulation/Gait   Ambulation/Gait  Yes    Ambulation/Gait Assistance  5: Supervision;4: Min guard    Ambulation/Gait Assistance Details  Pt ambulates outdoors on paved and uneven surface with therapist providing resistance to gait to elicit balance strategies during functional mobility. Pt demonstrates ability to utilize stepping strategy and cross over stepping strategy to prevent LOB episode. Pt also negotiates obstacles, grass, curbs, ramps, and participates in dual cognitive task during ambulation trial with therapist providing supervision to min guard assist. Lastly, patient is able to vary gait speed when cued safely.     Ambulation Distance (Feet)  1000 Feet    Assistive device  None    Gait Pattern  Step-through pattern;Decreased dorsiflexion - right;Decreased dorsiflexion - left;Decreased trunk rotation    Ambulation Surface  Unlevel;Outdoor;Paved;Gravel;Grass      Dynamic Standing Balance   Dynamic  Standing - Comments  Pt performs lateral weaving and forward/lateral side stepping over hurdles of various heights with therapist providing CGA. Therapist provides VC's to reduce compensatory movement demonstrated by Pt stepping around hurdles instead of over hurdles. Pt demonstrates increased challenge with lateral stepping over hurdles with therapist cueing patient to slow down speed of movement for improved control. Pt then performs multiple variations of agility ladder including; single foot in each box, lateral side stepping into each box, and jumping wide to jumping with feet together  into each box. Pt attempts to perform single LE hopping but unable 2/2 to power and high level balance deficits.              PT Education - 03/23/18 1609    Education Details  Therapist provided education regarding continued recommendation for patient to rent a scooter while in [place] 2/2 to endurance deficits. Pt verbalizes understanding but would like to attempt to walk around the parks with his cane and rent a scooter if necessary.     Person(s) Educated  Patient    Methods  Explanation    Comprehension  Verbalized understanding       PT Short Term Goals - 03/09/18 1522      PT SHORT TERM GOAL #1   Title  Pt will participate in the initiation of an HEP to improve LE strength and balance to promote increased independence with functional mobility    Time  3    Period  Weeks    Status  Achieved      PT SHORT TERM GOAL #2   Title  Pt will improve Berg score to >/= 43/56 indicating decreased fall risk potential     Baseline  10/24: 52/56 indicative of low fall risk potential     Time  3    Period  Weeks    Status  Achieved      PT SHORT TERM GOAL #3   Title  Pt will improve 5x STS from standard height chair with no UE assist to <16 seconds indicating reduction in fall risk and improvement in LE power     Baseline  10/24: 10.43 seconds from standard height chair with no UE assist     Time  3    Period  Weeks    Status  Achieved      PT SHORT TERM GOAL #4   Title  Pt will participate in 6 min walk test with LRAD to assess baseline endurance during functional mobility in preparation for upcoming trip to [place]  10/24: 1374 feet with Hurrycane     Time  3    Period  Weeks    Status  Achieved      PT SHORT TERM GOAL #5   Title  Pt will negotiate 12 steps with single HR and step to pattern technique with CGA to simulate home environment and improve functional mobility     Baseline  10/24: Pt negotiates 12 steps with R HR demonstrating reciprocal stepping with  supervision     Time  3    Period  Weeks    Status  Achieved      PT SHORT TERM GOAL #6   Title  Pt will improve participate in gait speed assessment with LRAD to assess fall risk potential during functional mobility     Baseline  10/24: 4.05 ft/sec with Hurrycane indicative of community ambulation     Time  3    Period  Weeks    Status  Achieved      PT SHORT TERM GOAL #7   Title  Pt will participate in DGI to establish baseline fall risk with dynamic gait     Time  3    Period  Weeks    Status  Achieved      PT SHORT TERM GOAL #8   Title  Pt will ambulate 500 feet in community negotiating uneven surfaces, curbs, inclines, and ability to vary gait speed when cued using LRAD and CGA to improve functional mobility with community distances.     Time  3    Period  Weeks    Status  Achieved        PT Long Term Goals - 03/09/18 1526      PT LONG TERM GOAL #1   Title  Pt will report compliancy and independence with HEP to improve strength, balance, and functional mobility     Time  6    Period  Weeks    Status  New    Target Date  04/03/18      PT LONG TERM GOAL #2   Title  Pt will improve Berg score to >/= 53/56 indicating reduction in fall risk potential     Baseline  10/24: 52/56 indicative of low fall risk potential     Time  6    Period  Weeks    Status  Revised    Target Date  04/03/18      PT LONG TERM GOAL #3   Title  Pt will improve 5x STS time to </= 13 seconds from a standard height chair with no UE assist indicating decreased fall risk potential and improved LE power     Baseline  10/24: Pt performs from standard height chair with no UE assist in 10.43 seconds indicating low fall risk potential     Time  6    Period  Weeks    Status  Achieved      PT LONG TERM GOAL #4   Title  Pt will improve 6 min walk test score by 100 feet using LRAD indicating improvement in endurance and functional mobility     Baseline  10/24: 1374  feet with Hurrycane     Time  6     Period  Weeks    Status  Revised    Target Date  04/03/18      PT LONG TERM GOAL #5   Title  Pt will negotiate 12 steps using no HR and reciprocal stepping technique, Mod I, indicating improvement in functional mobility and community accessibility.     Baseline  10/24: 12 steps with R HR & reciprocal stepping technique with supervision     Time  6    Period  Weeks    Status  Revised    Target Date  04/03/18      PT LONG TERM GOAL #6   Title  Pt will improve gait speed >/= 4.37 seconds using LRAD indicating improvement in functional mobility and reduction in fall risk     Time  6    Period  Weeks    Status  Revised    Target Date  04/03/18      PT LONG TERM GOAL #7   Title  Pt will ambulate 1000 feet negotiating uneven terrain, curbs, ramps, and ability to change gait speed when cued, Mod I using LRAD, demonstrating improvement in functional mobility with community distances.  Time  6    Period  Weeks    Status  New    Target Date  04/03/18      PT LONG TERM GOAL #8   Title  Pt will score >/= 22/24 on the dynamic gait index indicating reduction in fall risk potential during dynamic gait     Time  6    Period  Weeks    Status  Revised            Plan - 03/23/18 1612    Clinical Impression Statement  Skilled session focused on gait training with dynamic balance challenges and high level balance with increased speed of movement. Pt tolerated session well and demonstrates good utilization of balance strategies during resisted gait trials. Pt demonstrates ability to perform balance challenges during gait training with no LOB episodes. Pt demonstrates increased challenge with performing high level balance with incorporation of SLS and jumping 2/2 to LE power and balance deficits. Pt will continue to benefit from skilled PT to address strength, balance, and functional mobility deficits.     Rehab Potential  Good    Clinical Impairments Affecting Rehab Potential  Peripheral nerve  regeneration time frame and absent sensation to plantar surface of bilateral feet negatively effecting patient's balance    PT Frequency  2x / week    PT Duration  6 weeks    PT Treatment/Interventions  ADLs/Self Care Home Management;Stair training;Functional mobility training;Patient/family education;Therapeutic activities;Therapeutic exercise;Passive range of motion;Balance training;DME Instruction;Electrical Stimulation;Gait training;Neuromuscular re-education;Manual techniques;Energy conservation    PT Next Visit Plan  treadmill ambulation/ inclines to simulate [place], & backwards walking, half kneeling with core, balance with no AD gait ,quad eccentrics with ball roll outs, foam balance with cone tapping progressing to band resistance, ambulation with cane, hurdles, step ups with dec UE assist and band resistance for time, stair negotiation without UE support; , physioball core stabilization with coordination,    PT Home Exercise Plan  [id number]     Consulted and Agree with Plan of Care  Patient       Patient will benefit from skilled therapeutic intervention in order to improve the following deficits and impairments:  Abnormal gait, Decreased coordination, Difficulty walking, Decreased endurance, Impaired UE functional use, Decreased activity tolerance, Decreased balance, Decreased knowledge of use of DME, Decreased mobility, Decreased strength, Impaired sensation  Visit Diagnosis: Muscle weakness (generalized)  Unsteadiness on feet  Other abnormalities of gait and mobility     Problem List Patient Active Problem List   Diagnosis Date Noted  . 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 GBS (Guillain Barre syndrome) ([place]) 02/09/2018  . Guillain Barr syndrome ([place])   . Dyslipidemia   . Benign essential HTN   . Prediabetes   . Hyponatremia   . Ascending paralysis ([place]) 02/03/2018  . Hyperglycemia 02/03/2018  . Primary localized osteoarthritis of right knee 06/09/2015  . DJD  (degenerative joint disease) of knee 06/09/2015  . Preoperative clearance 07/06/2014  . CAD (coronary artery disease) 06/14/2013  . Familial hyperlipidemia, high LDL 06/14/2013  . Essential hypertension 06/14/2013  . Melanoma ([place]) 06/14/2013    Floreen Comber, SPT 03/23/2018, 4:15 PM  [facility] [address] [place], [place], [address] Phone: [phone]   Fax:  [phone]  Name: DONNIVAN VILLENA MRN: [id number] Date of Birth: 1945/11/26

## 2018-03-23 NOTE — Progress Notes (Signed)
Subjective:    Patient ID: Henry Morrison, male    DOB: 1946-03-09, 72 y.o.   MRN: [id number]  HPI 72 year old right-handed male with history of CAD with stenting, hyperlipidemia, hypertension presents for follow up for GBS.   Last clinic visit 02/24/18.  Since that time, pt states he continues to be in therapies, but feels that he is doing fine. Balance deficits are his biggest challenge, but is learning to compensate.  He is doing everything prior to diagnosis except buttoning small buttons. BP is slightly elevated, but normal otherwise. He was restarted on prednisone by Derm. Denies falls. Dysesthesias are improving.  Pain Inventory Average Pain 0 Pain Right Now 0 My pain is no pain  In the last 24 hours, has pain interfered with the following? General activity 0 Relation with others 0 Enjoyment of life 0 What TIME of day is your pain at its worst? evening Sleep (in general) Fair  Pain is worse with: na Pain improves with: therapy/exercise Relief from Meds: 10  Mobility walk without assistance walk with assistance use a cane ability to climb steps?  yes do you drive?  yes  Function retired  Neuro/Psych numbness  Prior Studies Any changes since last visit?  no  Physicians involved in your care Any changes since last visit?  no   Family History  Problem Relation Age of Onset  . Heart disease Mother   . Cancer Father        kidney   Social History   Socioeconomic History  . Marital status: Married    Spouse name: Not on file  . Number of children: Not on file  . Years of education: Not on file  . Highest education level: Not on file  Occupational History  . Not on file  Social Needs  . Financial resource strain: Not on file  . Food insecurity:    Worry: Not on file    Inability: Not on file  . Transportation needs:    Medical: Not on file    Non-medical: Not on file  Tobacco Use  . Smoking status: Never Smoker  . Smokeless tobacco: Never Used    Substance and Sexual Activity  . Alcohol use: Yes    Alcohol/week: 10.0 standard drinks    Types: 10 Standard drinks or equivalent per week    Comment: wine 2 galsses each day  . Drug use: No  . Sexual activity: Not on file  Lifestyle  . Physical activity:    Days per week: Not on file    Minutes per session: Not on file  . Stress: Not on file  Relationships  . Social connections:    Talks on phone: Not on file    Gets together: Not on file    Attends religious service: Not on file    Active member of club or organization: Not on file    Attends meetings of clubs or organizations: Not on file    Relationship status: Not on file  Other Topics Concern  . Not on file  Social History Narrative  . Not on file   Past Surgical History:  Procedure Laterality Date  . COLONOSCOPY    . CORONARY ANGIOPLASTY WITH STENT PLACEMENT  10/2000   stenting of the CX vessel  . KNEE ARTHROSCOPY W/ MENISCECTOMY Left 07/15/2006  . KNEE ARTHROSCOPY W/ MENISCECTOMY Right 02/09/2012  . removal of skin cancer     right arm  . TOTAL KNEE ARTHROPLASTY Right 06/09/2015   Procedure:  RIGHT TOTAL KNEE ARTHROPLASTY;  Surgeon: Elsie Saas, MD;  Location: [facility];  Service: Orthopedics;  Laterality: Right;  . US ECHOCARDIOGRAPHY  03/07/2012   Trace AI,mild MR,mild to mod TR   Past Medical History:  Diagnosis Date  . Arthritis   . CAD (coronary artery disease)   . Cancer ([place])    skin cancer   . Hyperlipidemia   . Primary localized osteoarthritis of right knee 06/09/2015   BP (!) 146/68   Pulse 60   Ht 5\' 11"  (1.803 m)   Wt 197 lb (89.4 kg)   SpO2 97%   BMI 27.48 kg/m   Opioid Risk Score:   Fall Risk Score:  `1  Depression screen PHQ 2/9  Depression screen [facility] 2/9 02/24/2018 09/26/2015  Decreased Interest 0 0  Down, Depressed, Hopeless 0 0  PHQ - 2 Score 0 0  Altered sleeping 1 -  Tired, decreased energy 1 -  Change in appetite 0 -  Feeling bad or failure about yourself  0 -  Trouble  concentrating 0 -  Moving slowly or fidgety/restless 1 -  Suicidal thoughts 0 -  PHQ-9 Score 3 -  Difficult doing work/chores Somewhat difficult -    Review of Systems  Constitutional: Negative.   HENT: Negative.   Eyes: Negative.   Respiratory: Negative.   Cardiovascular: Negative.   Gastrointestinal: Negative.   Endocrine: Negative.   Genitourinary: Negative.   Musculoskeletal: Positive for gait problem.  Skin: Negative.   Allergic/Immunologic: Negative.   Neurological: Positive for numbness.  Hematological: Negative.   Psychiatric/Behavioral: Negative.   All other systems reviewed and are negative.     Objective:   Physical Exam Constitutional: No distress . Vital signs reviewed. HENT: Normocephalic.  Atraumatic. Eyes: EOMI. No discharge. Cardiovascular: RRR. No JVD. Respiratory: CTA bilaterally. Normal effort. GI: BS +. Non-distended. Musc:  No edema or tenderness in extremities. Gait: Decreased cadence Neuro: Alert Motor 4+-5/5 Bilateral ankle DF otherwise 5/5 Sensation diminished to light touch b/l feet Skin: Generalized rash. Psych: Pleasant.  Normal mood.    Assessment & Plan:  72 year old right-handed male with history of CAD with stenting, hyperlipidemia, hypertension presents for follow up for GBS.   1. 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  IVIG completed 02/07/2018             Cont therapies  2.  Hypertension.    Cont meds  Elevated today, otherwise relatively controlled  3. Gait abnormality  Cont therapies  Cont cane for safety  4. Dyesthesias  Does not want medications at present  ?Improving  5. ?B/l CTS  ?double crush   Pt would like to hold off on any intervention for the time being (NCS/EMG, medication)

## 2018-03-27 ENCOUNTER — Ambulatory Visit: Payer: Medicare Other | Admitting: Rehabilitative and Restorative Service Providers"

## 2018-03-27 ENCOUNTER — Encounter: Payer: Self-pay | Admitting: Physical Therapy

## 2018-03-27 ENCOUNTER — Ambulatory Visit: Payer: Medicare Other | Admitting: Physical Therapy

## 2018-03-27 DIAGNOSIS — M6281 Muscle weakness (generalized): Secondary | ICD-10-CM | POA: Diagnosis not present

## 2018-03-27 DIAGNOSIS — R2681 Unsteadiness on feet: Secondary | ICD-10-CM

## 2018-03-27 DIAGNOSIS — R2689 Other abnormalities of gait and mobility: Secondary | ICD-10-CM

## 2018-03-27 NOTE — Therapy (Signed)
[facility] [address] [place], [place], [address] Phone: [phone]   Fax:  [phone]  Physical Therapy Treatment  Patient Details  Name: Henry Morrison MRN: [id number] Date of Birth: 11/09/1945 Referring Provider (PT): Delice Lesch, MD   Encounter Date: 03/27/2018  PT End of Session - 03/27/18 1510    Visit Number  8    Number of Visits  13    Date for PT Re-Evaluation  04/03/18    Authorization Type  [employer]- *requires 10th visit PN    PT Start Time  1400    PT Stop Time  1445    PT Time Calculation (min)  45 min    Activity Tolerance  Patient tolerated treatment well    Behavior During Therapy  [facility] for tasks assessed/performed       Past Medical History:  Diagnosis Date  . Arthritis   . CAD (coronary artery disease)   . Cancer ([place])    skin cancer   . Hyperlipidemia   . Primary localized osteoarthritis of right knee 06/09/2015    Past Surgical History:  Procedure Laterality Date  . COLONOSCOPY    . CORONARY ANGIOPLASTY WITH STENT PLACEMENT  10/2000   stenting of the CX vessel  . KNEE ARTHROSCOPY W/ MENISCECTOMY Left 07/15/2006  . KNEE ARTHROSCOPY W/ MENISCECTOMY Right 02/09/2012  . removal of skin cancer     right arm  . TOTAL KNEE ARTHROPLASTY Right 06/09/2015   Procedure: RIGHT TOTAL KNEE ARTHROPLASTY;  Surgeon: Elsie Saas, MD;  Location: [facility];  Service: Orthopedics;  Laterality: Right;  . US ECHOCARDIOGRAPHY  03/07/2012   Trace AI,mild MR,mild to mod TR    There were no vitals filed for this visit.  Subjective Assessment - 03/27/18 1406    Subjective  Reports everything is going well and he had a busy weekend carrying boxes and performing yard work. Reports everything is going well with his HEP and leaves for [place] next Thursday.     Patient is accompained by:  --   [name]   Pertinent History   CAD with stenting, skin cancer, HLD, HTN, R knee OA    Limitations   Standing;Walking;Lifting    Patient Stated Goals  Reports goal of improving functional mobility in 6 weeks in preparation for family trip to [place].     Currently in Pain?  No/denies        Skilled Physical  Therapy Intervention:  Patient verbalizes and demonstrates understanding of the below updated corner balance exercises with proper technique to improve safety with functional mobility.   Exercises  Tandem Stance with Head Rotation - 3 sets - 30 hold - 1x daily - 7x weekly  Romberg Stance with Eyes Closed - 3 sets - 30 hold - 1x daily - 7x weekly  Standing Single Leg Stance with Unilateral Counter Support - 3 sets - 20 hold - 1x daily - 7x weekly  Romberg Stance Eyes Closed on Foam Pad - 3 sets - 30 hold - 1x daily - 7x weekly  Tandem Walking with Head Rotations - 10 reps - 3 sets - 1x daily - 7x weekly     [facility] Adult PT Treatment/Exercise - 03/27/18 1453      Exercises   Exercises  Knee/Hip      Knee/Hip Exercises: Aerobic   Tread Mill  Pt performs gait on treadmill working on ambulation on inclines to [employer]. Pt performs 10 minutes on incline 3% with  speeds ranging from 0.9 MPH-1.2 MPH. Pt demonstrates increased challenge maintaining balance with no UE assist 2/2 to dec proprioception and somatosensory input to B LE's. Pt demonstrates mid foot strike during initial contact, dec stride/step length bilaterally, and dec hip extension. Pt demonstrates LOB episode when attempting to increase stride length requiring UE assist. Pt then performs 5 min on incline 2% at 1.3 MPH with ability to demonstrate improvement in gait mechanics. S/p ambulation trial patient performs gait on level surface for 115 feet demonstrating improvement in heel strike during initial contact and hip extension during terminal stance.             PT Education - 03/27/18 1510    Education Details  Therapist provided education regarding revised corner balance exercises with patient verbalizing and  demonstrating understanding.     Person(s) Educated  Patient    Methods  Explanation    Comprehension  Verbalized understanding;Returned demonstration       PT Short Term Goals - 03/09/18 1522      PT SHORT TERM GOAL #1   Title  Pt will participate in the initiation of an HEP to improve LE strength and balance to promote increased independence with functional mobility    Time  3    Period  Weeks    Status  Achieved      PT SHORT TERM GOAL #2   Title  Pt will improve Berg score to >/= 43/56 indicating decreased fall risk potential     Baseline  10/24: 52/56 indicative of low fall risk potential     Time  3    Period  Weeks    Status  Achieved      PT SHORT TERM GOAL #3   Title  Pt will improve 5x STS from standard height chair with no UE assist to <16 seconds indicating reduction in fall risk and improvement in LE power     Baseline  10/24: 10.43 seconds from standard height chair with no UE assist     Time  3    Period  Weeks    Status  Achieved      PT SHORT TERM GOAL #4   Title  Pt will participate in 6 min walk test with LRAD to assess baseline endurance during functional mobility in preparation for upcoming trip to [place]  10/24: 1374 feet with Hurrycane     Time  3    Period  Weeks    Status  Achieved      PT SHORT TERM GOAL #5   Title  Pt will negotiate 12 steps with single HR and step to pattern technique with CGA to simulate home environment and improve functional mobility     Baseline  10/24: Pt negotiates 12 steps with R HR demonstrating reciprocal stepping with supervision     Time  3    Period  Weeks    Status  Achieved      PT SHORT TERM GOAL #6   Title  Pt will improve participate in gait speed assessment with LRAD to assess fall risk potential during functional mobility     Baseline  10/24: 4.05 ft/sec with Hurrycane indicative of community ambulation     Time  3    Period  Weeks    Status  Achieved      PT SHORT TERM GOAL #7   Title  Pt  will participate in DGI to establish baseline fall risk with dynamic gait  Time  3    Period  Weeks    Status  Achieved      PT SHORT TERM GOAL #8   Title  Pt will ambulate 500 feet in community negotiating uneven surfaces, curbs, inclines, and ability to vary gait speed when cued using LRAD and CGA to improve functional mobility with community distances.     Time  3    Period  Weeks    Status  Achieved        PT Long Term Goals - 03/09/18 1526      PT LONG TERM GOAL #1   Title  Pt will report compliancy and independence with HEP to improve strength, balance, and functional mobility     Time  6    Period  Weeks    Status  New    Target Date  04/03/18      PT LONG TERM GOAL #2   Title  Pt will improve Berg score to >/= 53/56 indicating reduction in fall risk potential     Baseline  10/24: 52/56 indicative of low fall risk potential     Time  6    Period  Weeks    Status  Revised    Target Date  04/03/18      PT LONG TERM GOAL #3   Title  Pt will improve 5x STS time to </= 13 seconds from a standard height chair with no UE assist indicating decreased fall risk potential and improved LE power     Baseline  10/24: Pt performs from standard height chair with no UE assist in 10.43 seconds indicating low fall risk potential     Time  6    Period  Weeks    Status  Achieved      PT LONG TERM GOAL #4   Title  Pt will improve 6 min walk test score by 100 feet using LRAD indicating improvement in endurance and functional mobility     Baseline  10/24: 1374  feet with Hurrycane     Time  6    Period  Weeks    Status  Revised    Target Date  04/03/18      PT LONG TERM GOAL #5   Title  Pt will negotiate 12 steps using no HR and reciprocal stepping technique, Mod I, indicating improvement in functional mobility and community accessibility.     Baseline  10/24: 12 steps with R HR & reciprocal stepping technique with supervision     Time  6    Period  Weeks    Status  Revised     Target Date  04/03/18      PT LONG TERM GOAL #6   Title  Pt will improve gait speed >/= 4.37 seconds using LRAD indicating improvement in functional mobility and reduction in fall risk     Time  6    Period  Weeks    Status  Revised    Target Date  04/03/18      PT LONG TERM GOAL #7   Title  Pt will ambulate 1000 feet negotiating uneven terrain, curbs, ramps, and ability to change gait speed when cued, Mod I using LRAD, demonstrating improvement in functional mobility with community distances.     Time  6    Period  Weeks    Status  New    Target Date  04/03/18      PT LONG TERM GOAL #8   Title  Pt  will score >/= 22/24 on the dynamic gait index indicating reduction in fall risk potential during dynamic gait     Time  6    Period  Weeks    Status  Revised            Plan - 03/27/18 1401    Clinical Impression Statement  Skilled session focused on gait training on the treadmill and updating patient's HEP corner balance exercises. Pt tolerated session well and was able to ambulate on the treadmill for 15 minute duration without reporting any fatigue or demonstrating SOB episodes. Pt demonstrates ability to perform all corner balance progressions with proper technique requiring intermittant UE assist only for SLS balance. Pt demonstrates increased challenge performing balance with narrow BOS on compliant surfaces. Pt will continue to benefit from skilled PT to address high level balance, strength, and functional mobility deficits.    Rehab Potential  Good    Clinical Impairments Affecting Rehab Potential  Peripheral nerve regeneration time frame and absent sensation to plantar surface of bilateral feet negatively effecting patient's balance    PT Frequency  2x / week    PT Duration  6 weeks    PT Treatment/Interventions  ADLs/Self Care Home Management;Stair training;Functional mobility training;Patient/family education;Therapeutic activities;Therapeutic exercise;Passive range of  motion;Balance training;DME Instruction;Electrical Stimulation;Gait training;Neuromuscular re-education;Manual techniques;Energy conservation    PT Next Visit Plan  Check LTG or wait until he returns from [place] on 12/11?  Patient left his HEP on Monday - give to him on Thursday.  trampoline balance, half kneeling with core, balance with no AD gait ,quad eccentrics with ball roll outs, foam balance with cone tapping progressing to band resistance, ambulation with cane, hurdles, step ups with dec UE assist and band resistance for time, stair negotiation without UE support; , physioball core stabilization with coordination,    PT Home Exercise Plan  [id number]     Consulted and Agree with Plan of Care  Patient       Patient will benefit from skilled therapeutic intervention in order to improve the following deficits and impairments:  Abnormal gait, Decreased coordination, Difficulty walking, Decreased endurance, Impaired UE functional use, Decreased activity tolerance, Decreased balance, Decreased knowledge of use of DME, Decreased mobility, Decreased strength, Impaired sensation  Visit Diagnosis: Unsteadiness on feet  Other abnormalities of gait and mobility  Muscle weakness (generalized)     Problem List Patient Active Problem List   Diagnosis Date Noted  . 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 GBS (Guillain Barre syndrome) ([place]) 02/09/2018  . Guillain Barr syndrome ([place])   . Dyslipidemia   . Benign essential HTN   . Prediabetes   . Hyponatremia   . Ascending paralysis ([place]) 02/03/2018  . Hyperglycemia 02/03/2018  . Primary localized osteoarthritis of right knee 06/09/2015  . DJD (degenerative joint disease) of knee 06/09/2015  . Preoperative clearance 07/06/2014  . CAD (coronary artery disease) 06/14/2013  . Familial hyperlipidemia, high LDL 06/14/2013  . Essential hypertension 06/14/2013  . Melanoma ([place]) 06/14/2013    Floreen Comber, SPT 03/27/2018, 3:18 PM  [facility] [address] [place], [place], [address] Phone: [phone]   Fax:  [phone]  Name: Henry Morrison MRN: [id number] Date of Birth: September 08, 1945

## 2018-03-27 NOTE — Patient Instructions (Signed)
Access Code: [email or web address]  URL: [email or web address]  Date: 03/27/2018  Prepared by: [place] - 10 reps - 2 sets - 1x daily - 7x weekly  Standing Hip Extension and Flexion with Resistance - 10 reps - 3 sets - 1x daily - 7x weekly  Side Stepping with Resistance at Feet - 10 reps - 3 sets - 1x daily - 7x weekly  Wall Squat Hold with Ball - 10 reps - 2 sets - 1x daily - 7x weekly  Seated Hamstring Stretch with Strap - 3 sets - 30-60 hold - 1x daily - 7x weekly  Quadruped Hip Abduction with Resistance Loop - 10 reps - 2 sets - 1x daily - 5x weekly  Tandem Stance with Head Rotation - 3 sets - 30 hold - 1x daily - 7x weekly  Beginner Front Arm Support - 10 reps - 3 sets - 1x daily - 7x weekly  Gastroc Stretch on Wall - 3 reps - 1 sets - 30 hold - 1x daily - 7x weekly  Romberg Stance with Eyes Closed - 3 sets - 30 hold - 1x daily - 7x weekly  Standing Single Leg Stance with Unilateral Counter Support - 3 sets - 20 hold - 1x daily - 7x weekly  Romberg Stance Eyes Closed on Foam Pad - 3 sets - 30 hold - 1x daily - 7x weekly  Tandem Walking with Head Rotations - 10 reps - 3 sets - 1x daily - 7x weekly

## 2018-03-29 LAB — LIPID PANEL
CHOLESTEROL TOTAL: 156 mg/dL (ref 100–199)
Chol/HDL Ratio: 1.5 ratio (ref 0.0–5.0)
HDL: 106 mg/dL (ref >39)
LDL CALC: 37 mg/dL (ref 0–99)
Triglycerides: 67 mg/dL (ref 0–149)
VLDL CHOLESTEROL CAL: 13 mg/dL (ref 5–40)

## 2018-03-30 ENCOUNTER — Ambulatory Visit: Payer: Medicare Other | Admitting: Rehabilitative and Restorative Service Providers"

## 2018-03-30 ENCOUNTER — Encounter: Payer: Self-pay | Admitting: Rehabilitative and Restorative Service Providers"

## 2018-03-30 DIAGNOSIS — R2689 Other abnormalities of gait and mobility: Secondary | ICD-10-CM

## 2018-03-30 DIAGNOSIS — R2681 Unsteadiness on feet: Secondary | ICD-10-CM

## 2018-03-30 DIAGNOSIS — M6281 Muscle weakness (generalized): Secondary | ICD-10-CM | POA: Diagnosis not present

## 2018-03-30 NOTE — Therapy (Signed)
[facility] [address] [place], [place], [address] Phone: [phone]   Fax:  [phone]  Physical Therapy Treatment  Patient Details  Name: Henry Morrison MRN: [id number] Date of Birth: 1946/03/10 Referring Provider (PT): Delice Lesch, MD   Encounter Date: 03/30/2018  PT End of Session - 03/30/18 1432    Visit Number  9    Number of Visits  13    Date for PT Re-Evaluation  04/03/18    Authorization Type  [employer]- *requires 10th visit PN    PT Start Time  1400    PT Stop Time  1420    PT Time Calculation (min)  20 min    Activity Tolerance  Patient tolerated treatment well    Behavior During Therapy  [facility] for tasks assessed/performed       Past Medical History:  Diagnosis Date  . Arthritis   . CAD (coronary artery disease)   . Cancer ([place])    skin cancer   . Hyperlipidemia   . Primary localized osteoarthritis of right knee 06/09/2015    Past Surgical History:  Procedure Laterality Date  . COLONOSCOPY    . CORONARY ANGIOPLASTY WITH STENT PLACEMENT  10/2000   stenting of the CX vessel  . KNEE ARTHROSCOPY W/ MENISCECTOMY Left 07/15/2006  . KNEE ARTHROSCOPY W/ MENISCECTOMY Right 02/09/2012  . removal of skin cancer     right arm  . TOTAL KNEE ARTHROPLASTY Right 06/09/2015   Procedure: RIGHT TOTAL KNEE ARTHROPLASTY;  Surgeon: Elsie Saas, MD;  Location: [facility];  Service: Orthopedics;  Laterality: Right;  . US ECHOCARDIOGRAPHY  03/07/2012   Trace AI,mild MR,mild to mod TR    There were no vitals filed for this visit.  Subjective Assessment - 03/30/18 1428    Subjective  Pt reports everything is going well with no issues or changes in status. Reports he needs to leave early to pick up his grand-daughter to take her to an appt.     Patient is accompained by:  --   Wife [name]   Pertinent History   CAD with stenting, skin cancer, HLD, HTN, R knee OA    Limitations  Standing;Walking;Lifting     Patient Stated Goals  Reports goal of improving functional mobility in 6 weeks in preparation for family trip to [place].     Currently in Pain?  No/denies         [facility] PT Assessment - 03/30/18 1429      Dynamic Gait Index   Level Surface  Normal    Change in Gait Speed  Normal    Gait with Horizontal Head Turns  Normal    Gait with Vertical Head Turns  Normal    Gait and Pivot Turn  Normal    Step Over Obstacle  Normal    Step Around Obstacles  Normal    Steps  Normal    Total Score  24         [facility] Adult PT Treatment/Exercise - 03/30/18 1429      Ambulation/Gait   Ambulation/Gait  Yes    Ambulation/Gait Assistance  6: Modified independent (Device/Increase time)    Ambulation Distance (Feet)  40 Feet    Assistive device  None    Gait Pattern  Step-through pattern;Decreased dorsiflexion - right;Decreased dorsiflexion - left;Decreased trunk rotation    Ambulation Surface  Level;Indoor    Gait velocity  5.32 ft/sec indicating normal walking speed     Stairs  Yes    Stairs Assistance  6: Modified independent (Device/Increase time)    Stair Management Technique  No rails;Alternating pattern    Number of Stairs  4   x3 trials= 12 steps total    Height of Stairs  6             PT Education - 03/30/18 1431    Education Details  Therapist provided education for patient to continue performing his HEP and to follow up with therapy when he returns from [place] vacation.     Person(s) Educated  Patient    Methods  Explanation    Comprehension  Verbalized understanding       PT Short Term Goals - 03/09/18 1522      PT SHORT TERM GOAL #1   Title  Pt will participate in the initiation of an HEP to improve LE strength and balance to promote increased independence with functional mobility    Time  3    Period  Weeks    Status  Achieved      PT SHORT TERM GOAL #2   Title  Pt will improve Berg score to >/= 43/56 indicating decreased fall risk potential     Baseline   10/24: 52/56 indicative of low fall risk potential     Time  3    Period  Weeks    Status  Achieved      PT SHORT TERM GOAL #3   Title  Pt will improve 5x STS from standard height chair with no UE assist to <16 seconds indicating reduction in fall risk and improvement in LE power     Baseline  10/24: 10.43 seconds from standard height chair with no UE assist     Time  3    Period  Weeks    Status  Achieved      PT SHORT TERM GOAL #4   Title  Pt will participate in 6 min walk test with LRAD to assess baseline endurance during functional mobility in preparation for upcoming trip to [place]  10/24: 1374 feet with Hurrycane     Time  3    Period  Weeks    Status  Achieved      PT SHORT TERM GOAL #5   Title  Pt will negotiate 12 steps with single HR and step to pattern technique with CGA to simulate home environment and improve functional mobility     Baseline  10/24: Pt negotiates 12 steps with R HR demonstrating reciprocal stepping with supervision     Time  3    Period  Weeks    Status  Achieved      PT SHORT TERM GOAL #6   Title  Pt will improve participate in gait speed assessment with LRAD to assess fall risk potential during functional mobility     Baseline  10/24: 4.05 ft/sec with Hurrycane indicative of community ambulation     Time  3    Period  Weeks    Status  Achieved      PT SHORT TERM GOAL #7   Title  Pt will participate in DGI to establish baseline fall risk with dynamic gait     Time  3    Period  Weeks    Status  Achieved      PT SHORT TERM GOAL #8   Title  Pt will ambulate 500 feet in community negotiating uneven surfaces, curbs, inclines, and ability to vary gait  speed when cued using LRAD and CGA to improve functional mobility with community distances.     Time  3    Period  Weeks    Status  Achieved        PT Long Term Goals - 03/30/18 1432      PT LONG TERM GOAL #1   Title  Pt will report compliancy and independence with HEP to improve  strength, balance, and functional mobility     Time  6    Period  Weeks    Status  On-going      PT LONG TERM GOAL #2   Title  Pt will improve Berg score to >/= 53/56 indicating reduction in fall risk potential     Baseline  10/24: 52/56 indicative of low fall risk potential     Time  6    Period  Weeks    Status  On-going      PT LONG TERM GOAL #3   Title  Pt will improve 5x STS time to </= 13 seconds from a standard height chair with no UE assist indicating decreased fall risk potential and improved LE power     Baseline  10/24: Pt performs from standard height chair with no UE assist in 10.43 seconds indicating low fall risk potential     Time  6    Period  Weeks    Status  Achieved      PT LONG TERM GOAL #4   Title  Pt will improve 6 min walk test score by 100 feet using LRAD indicating improvement in endurance and functional mobility     Baseline  10/24: 1374  feet with Hurrycane     Time  6    Period  Weeks    Status  On-going      PT LONG TERM GOAL #5   Title  Pt will negotiate 12 steps using no HR and reciprocal stepping technique, Mod I, indicating improvement in functional mobility and community accessibility.     Baseline  11/14: 12 steps with no HR's and reciprocal stepping at Mod I level    Time  6    Period  Weeks    Status  Achieved      PT LONG TERM GOAL #6   Title  Pt will improve gait speed >/= 4.37 seconds using LRAD indicating improvement in functional mobility and reduction in fall risk    11/14: 5.32 ft/sec with no AD indicative of normal walking speed   Time  6    Period  Weeks    Status  Achieved      PT LONG TERM GOAL #7   Title  Pt will ambulate 1000 feet negotiating uneven terrain, curbs, ramps, and ability to change gait speed when cued, Mod I using LRAD, demonstrating improvement in functional mobility with community distances.     Time  6    Period  Weeks    Status  On-going      PT LONG TERM GOAL #8   Title  Pt will score >/= 22/24 on the  dynamic gait index indicating reduction in fall risk potential during dynamic gait    11/14: 24/24 when assessed    Time  6    Period  Weeks    Status  Achieved            Plan - 03/30/18 1433    Clinical Impression Statement  Skilled session focused on beginning to assess patient progress with  long term goals. Pt demonstrates improvement in functional mobility indicated by ability to perform 12 steps with no HR's utilizing reciprocal stepping technique at Mod I level. Pt's gait speed has significantly improved to 5.32 ft/sec indicative of normal walking speed with no AD. Pt's DGI score improved to 24/24 indicating normal fall risk potential and significant improvement in functional mobility with dynamic challenges. At this time, therapist recommends patient follow up with PT s/p [place] trip and continue assessing long term goals to establish progress with functional mobility.     Rehab Potential  Good    Clinical Impairments Affecting Rehab Potential  Peripheral nerve regeneration time frame and absent sensation to plantar surface of bilateral feet negatively effecting patient's balance    PT Frequency  2x / week    PT Duration  6 weeks    PT Treatment/Interventions  ADLs/Self Care Home Management;Stair training;Functional mobility training;Patient/family education;Therapeutic activities;Therapeutic exercise;Passive range of motion;Balance training;DME Instruction;Electrical Stimulation;Gait training;Neuromuscular re-education;Manual techniques;Energy conservation    PT Next Visit Plan  check remaining LTG's and follow up s/p [place] trip with preparations for d/c.    trampoline balance, half kneeling with core, balance with no AD gait ,quad eccentrics with ball roll outs, foam balance with cone tapping progressing to band resistance, ambulation with cane, hurdles, step ups with dec UE assist and band resistance for time, stair negotiation without UE support; , physioball core stabilization with  coordination,    PT Home Exercise Plan  [id number]     Consulted and Agree with Plan of Care  Patient       Patient will benefit from skilled therapeutic intervention in order to improve the following deficits and impairments:  Abnormal gait, Decreased coordination, Difficulty walking, Decreased endurance, Impaired UE functional use, Decreased activity tolerance, Decreased balance, Decreased knowledge of use of DME, Decreased mobility, Decreased strength, Impaired sensation  Visit Diagnosis: Unsteadiness on feet  Other abnormalities of gait and mobility     Problem List Patient Active Problem List   Diagnosis Date Noted  . 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 GBS (Guillain Barre syndrome) ([place]) 02/09/2018  . Guillain Barr syndrome ([place])   . Dyslipidemia   . Benign essential HTN   . Prediabetes   . Hyponatremia   . Ascending paralysis ([place]) 02/03/2018  . Hyperglycemia 02/03/2018  . Primary localized osteoarthritis of right knee 06/09/2015  . DJD (degenerative joint disease) of knee 06/09/2015  . Preoperative clearance 07/06/2014  . CAD (coronary artery disease) 06/14/2013  . Familial hyperlipidemia, high LDL 06/14/2013  . Essential hypertension 06/14/2013  . Melanoma ([place]) 06/14/2013    Floreen Comber, SPT 03/30/2018, 2:38 PM  [facility] [address] [place], [place], [address] Phone: [phone]   Fax:  [phone]  Name: PRITESH SOBECKI MRN: [id number] Date of Birth: Oct 06, 1945

## 2018-04-26 ENCOUNTER — Ambulatory Visit: Payer: Medicare Other | Attending: Pediatrics | Admitting: Physical Therapy

## 2018-04-26 ENCOUNTER — Encounter: Payer: Self-pay | Admitting: Physical Therapy

## 2018-04-26 DIAGNOSIS — M6281 Muscle weakness (generalized): Secondary | ICD-10-CM | POA: Diagnosis present

## 2018-04-26 DIAGNOSIS — R296 Repeated falls: Secondary | ICD-10-CM | POA: Insufficient documentation

## 2018-04-26 DIAGNOSIS — R29818 Other symptoms and signs involving the nervous system: Secondary | ICD-10-CM

## 2018-04-26 DIAGNOSIS — R2681 Unsteadiness on feet: Secondary | ICD-10-CM | POA: Diagnosis present

## 2018-04-26 DIAGNOSIS — R2689 Other abnormalities of gait and mobility: Secondary | ICD-10-CM | POA: Diagnosis present

## 2018-04-26 NOTE — Therapy (Signed)
[facility] [address] [place], [place], [address] Phone: [phone]   Fax:  [phone]  Physical Therapy Treatment 10th visit note and D/C Summary  Patient Details  Name: MURICE BARBAR MRN: [id number] Date of Birth: 1945/07/28 Referring Provider (PT): Delice Lesch, MD   Encounter Date: 04/26/2018   10th Visit Physical Therapy Progress Note  Dates of Reporting Period: 02/17/2018 to 04/26/2018   PT End of Session - 04/26/18 1329    Visit Number  10    Number of Visits  13    Date for PT Re-Evaluation  04/03/18    Authorization Type  [employer]- *requires 10th visit PN    PT Start Time  1021    PT Stop Time  1100    PT Time Calculation (min)  39 min    Activity Tolerance  Patient tolerated treatment well    Behavior During Therapy  [facility] for tasks assessed/performed       Past Medical History:  Diagnosis Date  . Arthritis   . CAD (coronary artery disease)   . Cancer ([place])    skin cancer   . Hyperlipidemia   . Primary localized osteoarthritis of right knee 06/09/2015    Past Surgical History:  Procedure Laterality Date  . COLONOSCOPY    . CORONARY ANGIOPLASTY WITH STENT PLACEMENT  10/2000   stenting of the CX vessel  . KNEE ARTHROSCOPY W/ MENISCECTOMY Left 07/15/2006  . KNEE ARTHROSCOPY W/ MENISCECTOMY Right 02/09/2012  . removal of skin cancer     right arm  . TOTAL KNEE ARTHROPLASTY Right 06/09/2015   Procedure: RIGHT TOTAL KNEE ARTHROPLASTY;  Surgeon: Elsie Saas, MD;  Location: [facility];  Service: Orthopedics;  Laterality: Right;  . US ECHOCARDIOGRAPHY  03/07/2012   Trace AI,mild MR,mild to mod TR    There were no vitals filed for this visit.  Subjective Assessment - 04/26/18 1027    Subjective  Pt returns from [place] and walked anywhere from 7 miles to 4 miles and 15,000 to 10,000 steps without any difficulty.  A little fatigued at night but no more than usual.  Feels his strenght  and sensation coming back; in the shower when he closes his eyes he notices less LOB.  Skin is still reacting to IVIG.    Patient is accompained by:  --   Wife [name]   Pertinent History   CAD with stenting, skin cancer, HLD, HTN, R knee OA    Limitations  Standing;Walking;Lifting    Patient Stated Goals  Reports goal of improving functional mobility in 6 weeks in preparation for family trip to [place].     Currently in Pain?  No/denies         [facility] PT Assessment - 04/26/18 1031      Assessment   Medical Diagnosis  GBS    Referring Provider (PT)  Delice Lesch, MD    Onset Date/Surgical Date  02/15/18      6 Minute Walk- Baseline   6 Minute Walk- Baseline  yes    BP (mmHg)  140/90    HR (bpm)  59    02 Sat (%RA)  98 %    Modified Borg Scale for Dyspnea  0- Nothing at all    Perceived Rate of Exertion (Borg)  7- Very, very light      6 Minute walk- Post Test   6 Minute Walk Post Test  yes    BP (mmHg)  160/90  HR (bpm)  65    02 Sat (%RA)  98 %    Modified Borg Scale for Dyspnea  0.5- Very, very slight shortness of breath    Perceived Rate of Exertion (Borg)  9- very light      6 minute walk test results    Aerobic Endurance Distance Walked  1534    Endurance additional comments  outside over paved surfaces without AD      Standardized Balance Assessment   Standardized Balance Assessment  Berg Balance Test      Berg Balance Test   Sit to Stand  Able to stand without using hands and stabilize independently    Standing Unsupported  Able to stand safely 2 minutes    Sitting with Back Unsupported but Feet Supported on Floor or Stool  Able to sit safely and securely 2 minutes    Stand to Sit  Sits safely with minimal use of hands    Transfers  Able to transfer safely, minor use of hands    Standing Unsupported with Eyes Closed  Able to stand 10 seconds safely    Standing Ubsupported with Feet Together  Able to place feet together independently and stand 1 minute safely     From Standing, Reach Forward with Outstretched Arm  Can reach confidently >25 cm (10")    From Standing Position, Pick up Object from Floor  Able to pick up shoe safely and easily    From Standing Position, Turn to Look Behind Over each Shoulder  Looks behind from both sides and weight shifts well    Turn 360 Degrees  Able to turn 360 degrees safely in 4 seconds or less    Standing Unsupported, Alternately Place Feet on Step/Stool  Able to stand independently and safely and complete 8 steps in 20 seconds    Standing Unsupported, One Foot in Front  Able to place foot tandem independently and hold 30 seconds    Standing on One Leg  Able to lift leg independently and hold > 10 seconds    Total Score  56    Berg comment:  56/56                           PT Education - 04/26/18 1329    Education Details  progress towards goals; D/C today    Person(s) Educated  Patient    Methods  Explanation    Comprehension  Verbalized understanding       PT Short Term Goals - 03/09/18 1522      PT SHORT TERM GOAL #1   Title  Pt will participate in the initiation of an HEP to improve LE strength and balance to promote increased independence with functional mobility    Time  3    Period  Weeks    Status  Achieved      PT SHORT TERM GOAL #2   Title  Pt will improve Berg score to >/= 43/56 indicating decreased fall risk potential     Baseline  10/24: 52/56 indicative of low fall risk potential     Time  3    Period  Weeks    Status  Achieved      PT SHORT TERM GOAL #3   Title  Pt will improve 5x STS from standard height chair with no UE assist to <16 seconds indicating reduction in fall risk and improvement in LE power     Baseline  10/24: 10.43 seconds from standard height chair with no UE assist     Time  3    Period  Weeks    Status  Achieved      PT SHORT TERM GOAL #4   Title  Pt will participate in 6 min walk test with LRAD to assess baseline endurance during functional  mobility in preparation for upcoming trip to [place]  10/24: 1374 feet with Hurrycane     Time  3    Period  Weeks    Status  Achieved      PT SHORT TERM GOAL #5   Title  Pt will negotiate 12 steps with single HR and step to pattern technique with CGA to simulate home environment and improve functional mobility     Baseline  10/24: Pt negotiates 12 steps with R HR demonstrating reciprocal stepping with supervision     Time  3    Period  Weeks    Status  Achieved      PT SHORT TERM GOAL #6   Title  Pt will improve participate in gait speed assessment with LRAD to assess fall risk potential during functional mobility     Baseline  10/24: 4.05 ft/sec with Hurrycane indicative of community ambulation     Time  3    Period  Weeks    Status  Achieved      PT SHORT TERM GOAL #7   Title  Pt will participate in DGI to establish baseline fall risk with dynamic gait     Time  3    Period  Weeks    Status  Achieved      PT SHORT TERM GOAL #8   Title  Pt will ambulate 500 feet in community negotiating uneven surfaces, curbs, inclines, and ability to vary gait speed when cued using LRAD and CGA to improve functional mobility with community distances.     Time  3    Period  Weeks    Status  Achieved        PT Long Term Goals - 04/26/18 1030      PT LONG TERM GOAL #1   Title  Pt will report compliancy and independence with HEP to improve strength, balance, and functional mobility     Time  6    Period  Weeks    Status  Achieved      PT LONG TERM GOAL #2   Title  Pt will improve Berg score to >/= 53/56 indicating reduction in fall risk potential     Baseline  56/56    Time  6    Period  Weeks    Status  Achieved      PT LONG TERM GOAL #3   Title  Pt will improve 5x STS time to </= 13 seconds from a standard height chair with no UE assist indicating decreased fall risk potential and improved LE power     Baseline  10/24: Pt performs from standard height chair with no UE  assist in 10.43 seconds indicating low fall risk potential     Time  6    Period  Weeks    Status  Achieved      PT LONG TERM GOAL #4   Title  Pt will improve 6 min walk test score by 100 feet using LRAD indicating improvement in endurance and functional mobility     Baseline  improved by 200'    Time  6  Period  Weeks    Status  Achieved      PT LONG TERM GOAL #5   Title  Pt will negotiate 12 steps using no HR and reciprocal stepping technique, Mod I, indicating improvement in functional mobility and community accessibility.     Baseline  11/14: 12 steps with no HR's and reciprocal stepping at Mod I level    Time  6    Period  Weeks    Status  Achieved      PT LONG TERM GOAL #6   Title  Pt will improve gait speed >/= 4.37 seconds using LRAD indicating improvement in functional mobility and reduction in fall risk    11/14: 5.32 ft/sec with no AD indicative of normal walking speed   Time  6    Period  Weeks    Status  Achieved      PT LONG TERM GOAL #7   Title  Pt will ambulate 1000 feet negotiating uneven terrain, curbs, ramps, and ability to change gait speed when cued, Mod I using LRAD, demonstrating improvement in functional mobility with community distances.     Time  6    Period  Weeks    Status  Achieved      PT LONG TERM GOAL #8   Title  Pt will score >/= 22/24 on the dynamic gait index indicating reduction in fall risk potential during dynamic gait    11/14: 24/24 when assessed    Time  6    Period  Weeks    Status  Achieved            Plan - 04/26/18 1330    Clinical Impression Statement  Pt returns from long vacation.  Performed re-assessment of patient's functional level and progress towards LTG.  Pt has made excellent progress and has met and exceeded all LTG.  Pt demonstrates improvements in dynamic balance and gait, improved balance reactions, improved LE strength and endurance.  Pt also demonstrates significantly lower falls risk and no longer requires  use of AD for gait in the community.  Pt is independent with HEP and is ready for D/C today.    Rehab Potential  Good    Clinical Impairments Affecting Rehab Potential  Peripheral nerve regeneration time frame and absent sensation to plantar surface of bilateral feet negatively effecting patient's balance    PT Frequency  2x / week    PT Duration  6 weeks    PT Treatment/Interventions  ADLs/Self Care Home Management;Stair training;Functional mobility training;Patient/family education;Therapeutic activities;Therapeutic exercise;Passive range of motion;Balance training;DME Instruction;Electrical Stimulation;Gait training;Neuromuscular re-education;Manual techniques;Energy conservation    PT Next Visit Plan  D/C    PT Home Exercise Plan  [id number]     Consulted and Agree with Plan of Care  Patient       Patient will benefit from skilled therapeutic intervention in order to improve the following deficits and impairments:  Abnormal gait, Decreased coordination, Difficulty walking, Decreased endurance, Impaired UE functional use, Decreased activity tolerance, Decreased balance, Decreased knowledge of use of DME, Decreased mobility, Decreased strength, Impaired sensation  Visit Diagnosis: Unsteadiness on feet  Other abnormalities of gait and mobility  Muscle weakness (generalized)  Repeated falls  Other symptoms and signs involving the nervous system     Problem List Patient Active Problem List   Diagnosis Date Noted  . 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 GBS (Guillain Barre syndrome) ([place]) 02/09/2018  . Guillain Barr syndrome ([place])   . Dyslipidemia   .  Benign essential HTN   . Prediabetes   . Hyponatremia   . Ascending paralysis ([place]) 02/03/2018  . Hyperglycemia 02/03/2018  . Primary localized osteoarthritis of right knee 06/09/2015  . DJD (degenerative joint disease) of knee 06/09/2015  . Preoperative clearance 07/06/2014  . CAD (coronary artery disease) 06/14/2013  . Familial  hyperlipidemia, high LDL 06/14/2013  . Essential hypertension 06/14/2013  . Melanoma ([place]) 06/14/2013    PHYSICAL THERAPY DISCHARGE SUMMARY  Visits from Start of Care: 10  Current functional level related to goals / functional outcomes: See LTG achievement and impression statement above.  All LTG met   Remaining deficits: Mild sensory impairments, mild balance impairments   Education / Equipment: HEP  Plan: Patient agrees to discharge.  Patient goals were met. Patient is being discharged due to meeting the stated rehab goals.  ?????     Rico Junker, PT, DPT 04/26/18    1:37 PM    [facility] [address] [place], [place], [address] Phone: [phone]   Fax:  [phone]  Name: ERDEM NAAS MRN: [id number] Date of Birth: 02-13-1946

## 2018-06-17 ENCOUNTER — Other Ambulatory Visit: Payer: Self-pay | Admitting: Cardiovascular Disease

## 2018-06-25 ENCOUNTER — Other Ambulatory Visit: Payer: Self-pay | Admitting: Cardiovascular Disease

## 2018-08-10 ENCOUNTER — Telehealth: Payer: Self-pay

## 2018-08-10 NOTE — Telephone Encounter (Signed)
Called patient, he would like to keep his appointment for next week.  He refuses to do any virtual visits or telephone visit.

## 2018-08-14 ENCOUNTER — Encounter: Payer: Self-pay | Admitting: Cardiovascular Disease

## 2018-08-14 ENCOUNTER — Ambulatory Visit: Payer: Medicare Other | Admitting: Cardiovascular Disease

## 2018-08-14 ENCOUNTER — Other Ambulatory Visit: Payer: Self-pay

## 2018-08-14 VITALS — BP 110/70 | HR 60 | Ht 70.5 in | Wt 194.2 lb

## 2018-08-14 DIAGNOSIS — I2510 Atherosclerotic heart disease of native coronary artery without angina pectoris: Secondary | ICD-10-CM | POA: Diagnosis not present

## 2018-08-14 DIAGNOSIS — E7849 Other hyperlipidemia: Secondary | ICD-10-CM | POA: Diagnosis not present

## 2018-08-14 DIAGNOSIS — I10 Essential (primary) hypertension: Secondary | ICD-10-CM | POA: Diagnosis not present

## 2018-08-14 DIAGNOSIS — Z9229 Personal history of other drug therapy: Secondary | ICD-10-CM

## 2018-08-14 DIAGNOSIS — Z8669 Personal history of other diseases of the nervous system and sense organs: Secondary | ICD-10-CM | POA: Diagnosis not present

## 2018-08-14 NOTE — Progress Notes (Signed)
Patient ID: Henry Morrison, male   DOB: [birth year], 73 y.o.   MRN: [id number]    Primary M.D.: Henry Morrison  HPI: Henry Morrison is a 73 y.o. male who presents to the office for a 12 month cardiology evaluation.   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  He was initially referred to me 21 years ago in 1996 when he was noted to have an a Henry Morrison plaque on eye examination by Henry Morrison as well as arcus cornealis.  At that time, he had told me that he had first been told of having high cholesterol approximatey 8 years previously with cholesterols in excess of 400, but he was not on any therapy.  He had undergone a routine treadmill test which reportely was normal by Henry Morrison.  Of note, his mother had very high cholesterols throughout her life and died of a myocardial infarction at age 74. When the patient was initially referred to me, his laboratory was notable for a total cholesterol of 510 and LDL cholesterol of 422.  Since my initial evaluation, he was started on aspirin therapy as well as initial statin treatment with simvastatin..  As more aggressive statins became available, he ultimately was treated with atorvastatin 80 mg in addition to Zetia 10 mg. Of note a review of his medical record indicates that there was a short period of time when he was traveling excessively and had stopped taking therapy. Subsequent blood work in 1999 upon his return showed his cholesterol had risen back to 343 with an LDL cholesterol of 245.   A nuclear perfusion study in 2002 demonstrated new development of inferolateral ischemia. In June 2002 he underwent stenting of a subtotal occlusion of a circumflex vessel which time a 2.75x23 mm HepaCoat stent was inserted. 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 A nuclear perfusion study in October 2011 continued to show normal  perfusion without scar or ischemia.   He remains active. 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  He has been maintained on atorvastatin 80 mg and Zetia 10 mg for his marked hyperlipidemia. His blood pressure has been treated with metoprolol succinate 25 mg as well as altace 10 mg. He also is on low dose isosorbide mononitrate and has not been experiencing any recurrent anginal symptoms.  Blood work done on 06/17/2014:  total cholesterol 194, triglycerides 72, HDL 66, and LDL cholesterol is 114. Liver tests are normal. Renal function is normal with a creatinine of 0.8.  Fasting glucose was 94.  Prior to undergoing knee replacement surgery  he underwent a nuclear perfusion study on 08/06/2014.  This remained low risk and did not demonstrate scar or ischemia.  He had normal LV function.  He underwent his knee replacement in January 2017 and has done well from a cardiovascular standpoint.  After seeing me in 2016, I had a long discussion with the patient and felt he was a candidate for PCSK-9 inhibition therapy.  He has been on Prilosec when 75 mg every 14 days since the spring of 2016.  This has resulted in dramatic benefit and lab work on 12/24/2014 showed a total cholesterol of 150 with an LDL cholesterol of 43, and blood work on 04/14/2015 revealed a total cholesterol 141 and LDL cholesterol at 36.  HDL was 93, VLDL 12, and  triglycerides 59.  Subsequent laboratory continued to show improvement in LDL and in January 2018 LDL had reduced to 24.  He recently underwent subsequent blood work on 07/09/2016 which showed an LDL of 57.  He continues to be on atorvastatin 80 mg, Zetia 10 mg, and apparently is only on the reduced dose of Praluent at 75 mg every 2 weeks.    I recommended he increase Praluent to 150 mg since his LDL had increased into the 40s.  Subsequent blood work demonstrated significant improvement with LDL back down to 24.  I last saw him  in March 2019 at which time he was now fully retired. He was staying active with his grandchildren and travels.  He denies recurrent chest pain, PND, orthopnea. He had just started on Medicare,  as result , as result, he now has to pay for PCSK9 whereas previously his commercial insurance paid 100%.  Because of this and the recently reduced cost of Repatha, which would result in a lower Medicare co-pay cost he was switched to Henry Morrison. .  He has tolerated this well, but his co-pay is still over $100 per month.  He continues to be on concomitant therapy with atorvastatin 80 mg and Zetia 10 mg.  He is not having any anginal symptoms on isosorbide 30 mg and Toprol 25 mg.  He  underwent a three-year f/u nuclear perfusion study on 08/01/2017 which remained entirely normal.  He presents for reevaluation.  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  He initially noticed tingling in his feet day after the flu shot the following day collapsed while in the shower.  He was unable to ambulate.  While hospitalized he underwent a 5-day course of IVIG and lumbar puncture showed modest elevation in the spinal fluid protein.  He completed inpatient rehabilitation and ultimately has sniffily improved.  It took him 3 months to be able to button his shirt.  He still notes some occasional tingling of his feet.   He remains fairly active.  He currently denies any definitive episodes of angina.  He was working in the yard this past weekend without difficulty.  Keeps active with his grand children and great-grandchildren.  He is unaware of palpitations.  He is tolerating Repatha well without side effects.  He continues to be on atorvastatin 80 mg and Zetia 10 mg.  His most recent lipid panel on July 24, 2018 at [facility] has shown an LDL cholesterol at 41.  This has risen slightly compared to 1 year ago when his LDL was 24 and on  another lab where it was 87.  He had questioned whether or not his atorvastatin dose could be reduced.  He continues to be on ramipril 10 mg, Toprol-XL 25 mg, and isosorbide mononitrate 30 mg for his CAD and there is no angina on therapy.  He presents for yearly evaluation.   Past Medical History:  Diagnosis Date   Arthritis    CAD (coronary artery disease)    Cancer ([place])    skin cancer    Hyperlipidemia    Primary localized osteoarthritis of right knee 06/09/2015    Past Surgical History:  Procedure Laterality Date   COLONOSCOPY     CORONARY ANGIOPLASTY WITH STENT PLACEMENT  10/2000   stenting of the CX vessel   KNEE ARTHROSCOPY W/ MENISCECTOMY Left 07/15/2006   KNEE ARTHROSCOPY W/ MENISCECTOMY Right 02/09/2012  removal of skin cancer     right arm   TOTAL KNEE ARTHROPLASTY Right 06/09/2015   Procedure: RIGHT TOTAL KNEE ARTHROPLASTY;  Surgeon: Elsie Saas, MD;  Location: [facility];  Service: Orthopedics;  Laterality: Right;   US ECHOCARDIOGRAPHY  03/07/2012   Trace AI,mild MR,mild to mod TR    Allergies  Allergen Reactions   Influenza Vaccines     Guillain-Barr   Other     Seasonal allergies    Current Outpatient Medications  Medication Sig Dispense Refill   acetaminophen (TYLENOL) 325 MG tablet Take 2 tablets (650 mg total) by mouth every 6 (six) hours as needed for mild pain, fever or headache.     atorvastatin (LIPITOR) 80 MG tablet TAKE 1 TABLET BY MOUTH EVERY DAY 90 tablet 0   Evolocumab (REPATHA SURECLICK) 546 MG/ML SOAJ Inject 140 mg into the skin every 14 (fourteen) days. 2 pen 11   ezetimibe (ZETIA) 10 MG tablet TAKE 1 TABLET BY MOUTH EVERY DAY 90 tablet 0   fluticasone (FLONASE) 50 MCG/ACT nasal spray Place 1 spray into both nostrils as needed for allergies.      isosorbide mononitrate (IMDUR) 30 MG 24 hr tablet TAKE 1 TABLET BY MOUTH EVERY DAY 90 tablet 0   metoprolol succinate (TOPROL-XL) 25 MG 24 hr tablet TAKE 1 TABLET BY MOUTH EVERY DAY 90  tablet 0   Multiple Vitamin (MULTIVITAMIN) tablet Take 1 tablet by mouth daily.     ramipril (ALTACE) 10 MG capsule TAKE 1 CAPSULE BY MOUTH EVERY DAY 90 capsule 0   No current facility-administered medications for this visit.     Social History   Socioeconomic History   Marital status: Married    Spouse name: Not on file   Number of children: Not on file   Years of education: Not on file   Highest education level: Not on file  Occupational History   Not on file  Social Needs   Financial resource strain: Not on file   Food insecurity:    Worry: Not on file    Inability: Not on file   Transportation needs:    Medical: Not on file    Non-medical: Not on file  Tobacco Use   Smoking status: Never Smoker   Smokeless tobacco: Never Used  Substance and Sexual Activity   Alcohol use: Yes    Alcohol/week: 10.0 standard drinks    Types: 10 Standard drinks or equivalent per week    Comment: wine 2 galsses each day   Drug use: No   Sexual activity: Not on file  Lifestyle   Physical activity:    Days per week: Not on file    Minutes per session: Not on file   Stress: Not on file  Relationships   Social connections:    Talks on phone: Not on file    Gets together: Not on file    Attends religious service: Not on file    Active member of club or organization: Not on file    Attends meetings of clubs or organizations: Not on file    Relationship status: Not on file   Intimate partner violence:    Fear of current or ex partner: Not on file    Emotionally abused: Not on file    Physically abused: Not on file    Forced sexual activity: Not on file  Other Topics Concern   Not on file  Social History Narrative   Not on file    Family History  Problem Relation Age of Onset   Heart disease Mother    Cancer Father        kidney    ROS General: Negative; No fevers, chills, or night sweats;  HEENT: Negative; No changes in vision or hearing, sinus  congestion, difficulty swallowing Pulmonary: Negative; No cough, wheezing, shortness of breath, hemoptysis Cardiovascular: Negative; No chest pain, presyncope, syncope, palpitations GI: Negative; No nausea, vomiting, diarrhea, or abdominal pain GU: Negative; No dysuria, hematuria, or difficulty voiding Musculoskeletal: Negative; no myalgias, joint pain, or weakness Hematologic/Oncology: Negative; no easy bruising, bleeding Endocrine: Negative; no heat/cold intolerance; no diabetes Neuro: Negative; no changes in balance, headaches Skin:  History of melanoma resection Psychiatric: Negative; No behavioral problems, depression Sleep: Negative; No snoring, daytime sleepiness, hypersomnolence, bruxism, restless legs, hypnogognic hallucinations, no cataplexy Other comprehensive 14 point system review is negative.   PE BP 110/70    Pulse 60    Ht 5' 10.5" (1.791 m)    Wt 194 lb 3.2 oz (88.1 kg)    BMI 27.47 kg/m    Vital signs above were recorded by me  Wt Readings from Last 3 Encounters:  08/14/18 194 lb 3.2 oz (88.1 kg)  03/23/18 197 lb (89.4 kg)  03/21/18 194 lb (88 kg)   General: Alert, oriented, no distress.  Skin: normal turgor, no rashes, warm and dry HEENT: Normocephalic, atraumatic. Pupils equal round and reactive to light; sclera anicteric; extraocular muscles intact; Nose without nasal septal hypertrophy Mouth/Parynx benign; Mallinpatti scale 2 Neck: No JVD, no carotid bruits; normal carotid upstroke Lungs: clear to ausculatation and percussion; no wheezing or rales Chest wall: without tenderness to palpitation Heart: PMI not displaced, RRR isolated ectopic beat, s1 s2 normal, 1/6 systolic murmur, no diastolic murmur, no rubs, gallops, thrills, or heaves Abdomen: soft, nontender; no hepatosplenomehaly, BS+; abdominal aorta nontender and not dilated by palpation. Back: no CVA tenderness Pulses 2+ Musculoskeletal: full range of motion, normal strength, no joint  deformities Extremities: no clubbing cyanosis or edema, Homan's sign negative  Neurologic: grossly nonfocal; Cranial nerves grossly wnl Psychologic: Normal mood and affect  ECG (independently read by me): Sinus bradycardia 59 bpm.  Isolated PVC.  Normal intervals.  March 2018 ECG (independently read by me): sinus bradycardia 54 bpm.  Normal intervals.  No ST segment changes.  July 2017 ECG (independently read by me): Normal sinus rhythm at 78 bpm.  Normal intervals.  No significant ST segment changes.  February 2016 ECG (independently read by me): Sinus bradycardia 54 bpm.  Normal intervals.  No ST segment changes.  January 2015ECG (independently read by me): Normal sinus rhythm. No significant ST-T changes. PoorR wave progression.  LABS: BMP Latest Ref Rng & Units 02/13/2018 02/12/2018 02/11/2018  Glucose 70 - 99 mg/dL 95 120(H) 100(H)  BUN 8 - 23 mg/dL 32(H) 33(H) 47(H)  Creatinine 0.61 - 1.24 mg/dL 1.00 0.98 1.23  Sodium 135 - 145 mmol/L 134(L) 134(L) 133(L)  Potassium 3.5 - 5.1 mmol/L 4.6 4.3 4.8  Chloride 98 - 111 mmol/L 103 102 103  CO2 22 - 32 mmol/L _0 Calcium 8.9 - 10.3 mg/dL 9.9 9.7 9.5   Hepatic Function Latest Ref Rng & Units 02/10/2018 02/07/2018 02/03/2018  Total Protein 6.5 - 8.1 g/dL 8.7(H) - 6.9  Albumin 3.5 - 5.0 g/dL 3.4(L) 3.3(L) 4.0  AST 15 - 41 U/L 81(H) - 36  ALT 0 - 44 U/L 137(H) - 44  Alk Phosphatase 38 - 126 U/L 80 - 74  Total Bilirubin 0.3 -  1.2 mg/dL 1.1 - 0.6  Bilirubin, Direct 0.00 - 0.40 mg/dL - - -   CBC Latest Ref Rng & Units 02/10/2018 02/09/2018 02/07/2018  WBC 4.0 - 10.5 K/uL 5.4 8.4 5.9  Hemoglobin 13.0 - 17.0 g/dL 14.0 14.1 13.4  Hematocrit 39.0 - 52.0 % 42.4 42.8 41.7  Platelets 150 - 400 K/uL 227 298 238   Lab Results  Component Value Date   MCV 91.4 02/10/2018   MCV 91.5 02/09/2018   MCV 92.7 02/07/2018   Lab Results  Component Value Date   TSH 1.369 02/03/2018    Lab Results  Component Value Date   HGBA1C 6.2 (H) 02/03/2018    Lipid Panel     Component Value Date/Time   CHOL 156 03/29/2018 0847   TRIG 67 03/29/2018 0847   HDL 106 03/29/2018 0847   CHOLHDL 1.5 03/29/2018 0847   CHOLHDL 1.4 05/19/2016 0807   VLDL 13 05/19/2016 0807   LDLCALC 37 03/29/2018 0847   Blood work by Henry. Sharlett Iles of [place] from 07/09/2016 was reviewed Cholesterol 125, triglycerides 71, HDL 54, LDL 57.  I reviewed most recent laboratory from [facility] collected on 03/12/2018.  Total cholesterol was 125, triglycerides 86, HDL 66, LDL 42.   Most recent laboratory from July 24, 2018 from [facility] were reviewed. Henry Morrison 111, triglycerides 70, HDL 56, LDL 41. Glucose 97.  BUN 26 creatinine 0.8.  LFTs normal. Hemoglobin 13.8 hematocrit 42.9. PSA had increased from 2.3-4.345 and he was given a prescription for Cipro pro 500 mg twice a day with plans to recheck a PSA in 3 weeks  IMPRESSION: 1. Familial hyperlipidemia, high LDL   2. Essential hypertension   3. Coronary artery disease involving native coronary artery of native heart without angina pectoris   4. 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 Meilech Virts is a 73 year old gentleman who has familial hyperlipidemia with initial cholesterols in excess of 500 and LDL cholesterols in excess of 400. Over the last 20 years, I have been aggressively managing his lipid studies.  In 2002 he was found to have a subtotally occluded obtuse marginal 1 stenosis after nuclear study suggested inferolateral ischemia. Underwent successful intervention with stenting of a subtotally occluded circumflex vessel. There was concomitant CAD, also involving his LAD and RCA.  A nuclear study done in 2016 for preoperative clearance prior to his knee surgery revealed continued normal perfusion without scar or ischemia. I reviewed his 3 year follow-up nuclear study with him in detail today.  This was entirely  normal. Based on the [place] diagnostic criteria he has familial hypercholesterolemia with a score of at least 13.  He has been on PCSK9 inhibition since 2016.  He has continued to take atorvastatin 80 mg and Zetia 10 mg.  .  Since starting therapy, his LDL cholesterols have consistently been under 50 and had been in the mid 20s on several occasions.  He  switched to Repatha due to cost continues to be on 140 mg every 2 weeks.  I reviewed his most recent laboratory from [place].  I have recommended he continue to stay on atorvastatin 80 mg and Zetia 10 mg along with his current dose of Repatha.  He is tolerating combination therapy well.  His blood pressure today is stable on his regimen consisting of ramipril 10 mg, metoprolol succinate 25 mg, and isosorbide 30 mg.  He is not having any anginal symptoms on current  therapy.  I reviewed his neurologic event with development of Guyon Aris Lot following the flu injection in September.  His ECG today does reveal an isolated PVC.  He is asymptomatic with reference to this.  I reviewed his nuclear study from 2019 which continue to be low risk.  I will see him in 1 year for reevaluation or sooner if problems arise.   Troy Sine, MD, [facility]  08/14/2018 11:25 AM

## 2018-08-14 NOTE — Patient Instructions (Signed)
Medication Instructions:  The current medical regimen is effective;  continue present plan and medications.  If you need a refill on your cardiac medications before your next appointment, please call your pharmacy.   Follow-Up: At [facility], you and your health needs are our priority.  As part of our continuing mission to provide you with exceptional heart care, we have created designated Provider Care Teams.  These Care Teams include your primary Cardiologist (physician) and Advanced Practice Providers (APPs -  Physician Assistants and Nurse Practitioners) who all work together to provide you with the care you need, when you need it. You will need a follow up appointment in 12 months.  Please call our office 2 months in advance to schedule this appointment.  You may see Thomas Kelly, MD or one of the following Advanced Practice Providers on your designated Care Team: Hao Meng, PA-C . Angela Duke, PA-C       

## 2018-09-20 ENCOUNTER — Other Ambulatory Visit: Payer: Self-pay | Admitting: Cardiovascular Disease

## 2018-09-20 NOTE — Telephone Encounter (Signed)
Atorvastatin and metoprolol Succ 25 mg refilled.

## 2018-10-26 IMAGING — CT CT L SPINE W/O CM
3 series · 10 of 33 positions shown, 11 images · non-contrast
Comparison: None available.

CLINICAL DATA: Initial evaluation for acute severe lower back pain
status post recent lumbar puncture.

EXAM:
CT LUMBAR SPINE WITHOUT CONTRAST
TECHNIQUE: Multidetector CT imaging of the lumbar spine was performed without
intravenous contrast administration. Multiplanar CT image
reconstructions were also generated.

[Series 5: l spine 2.0 st · axial · 0.28mm/px · z∈[+996,+1118]mm · 2 of 133 slices shown, 3 images]
[im 41/133  soft-tissue]
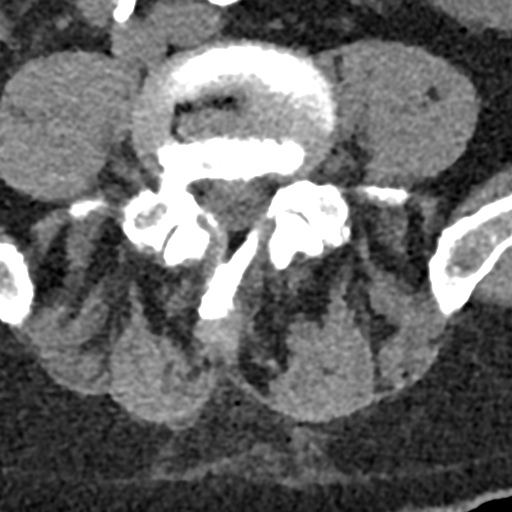
[im 41/133  bone]
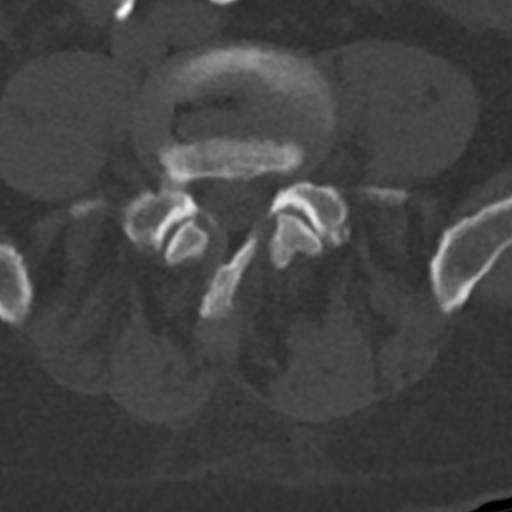
[im 102/133  bone]
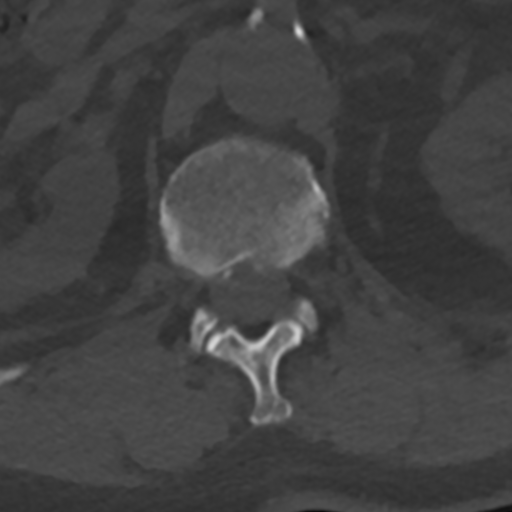

[Series 8: coronal st · coronal · 0.27mm/px · 3 of 64 slices shown]
[im 13/64  bone]
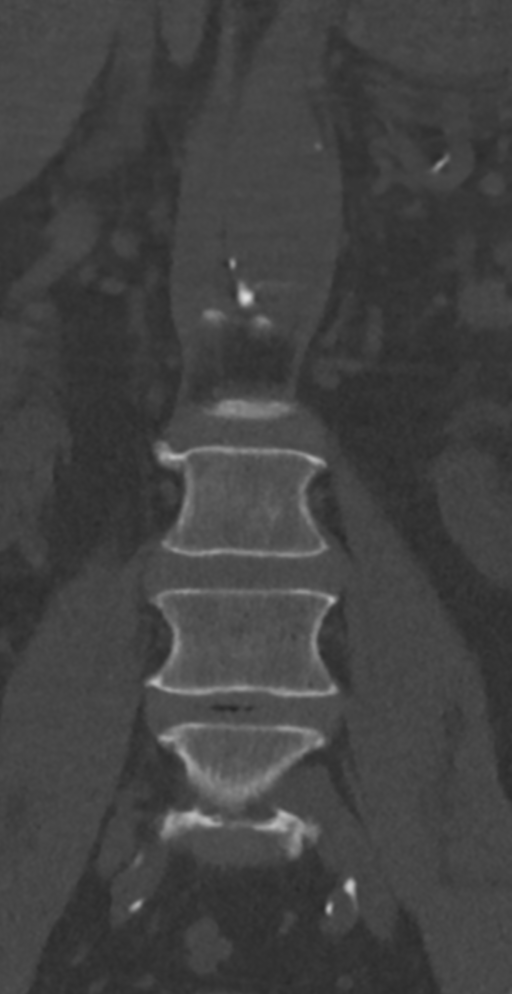
[im 26/64  bone]
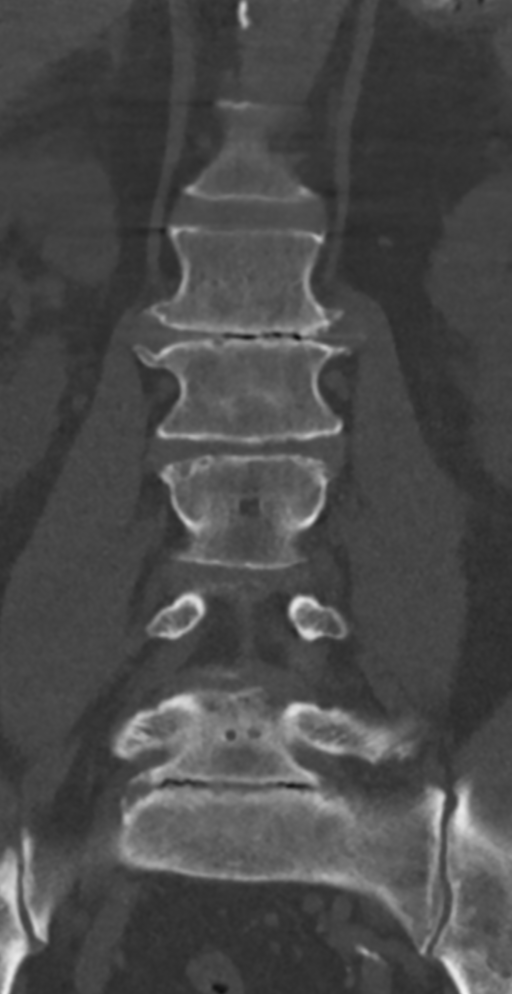
[im 38/64  bone]
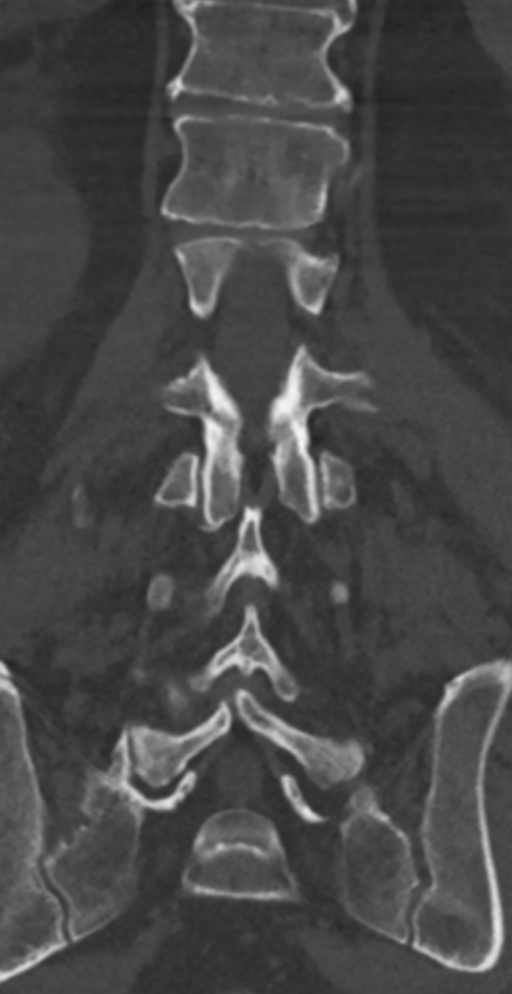

[Series 9: sagittal st · sagittal · 0.27mm/px · 5 of 61 slices shown]
[im 21/61  bone]
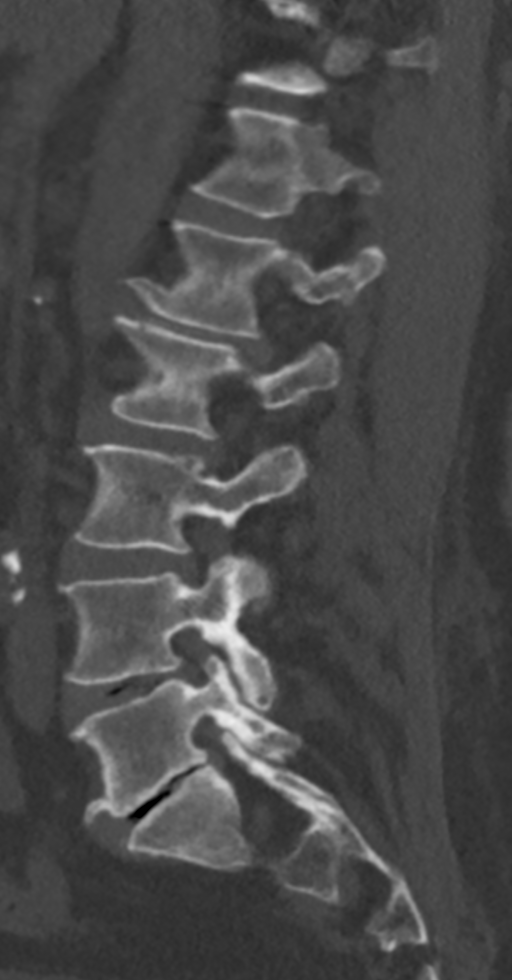
[im 26/61  bone]
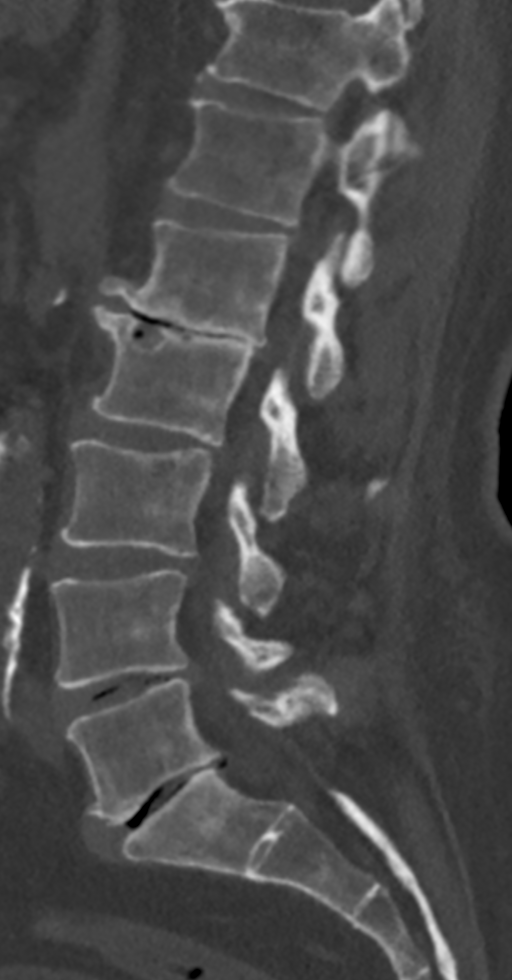
[im 31/61  bone]
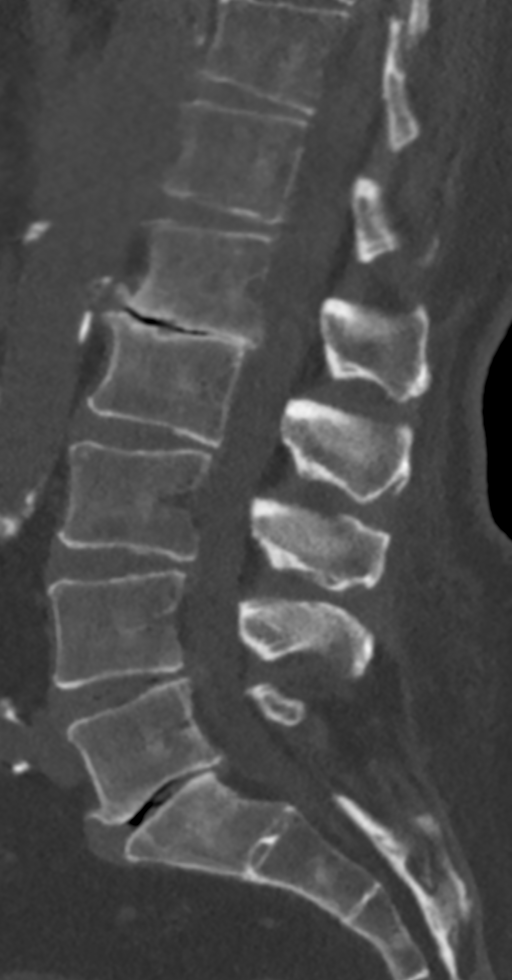
[im 36/61  bone]
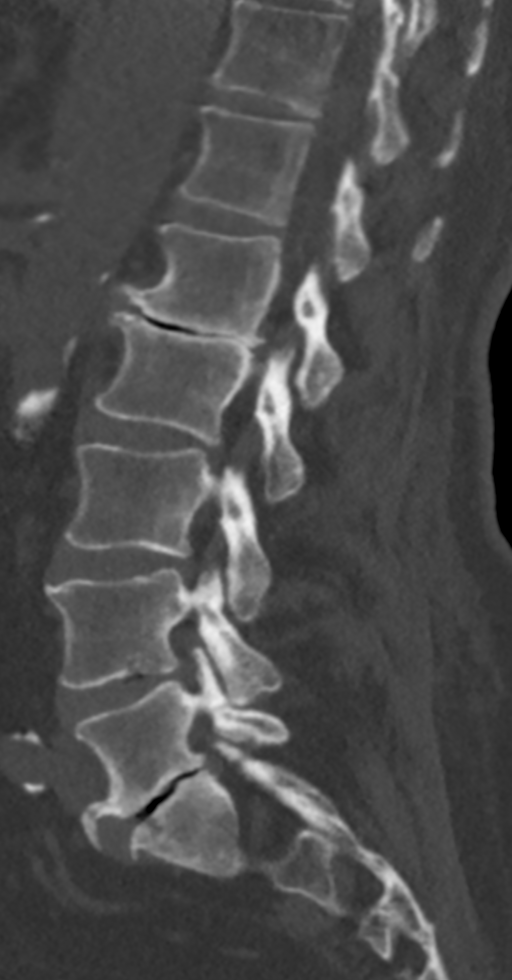
[im 41/61  bone]
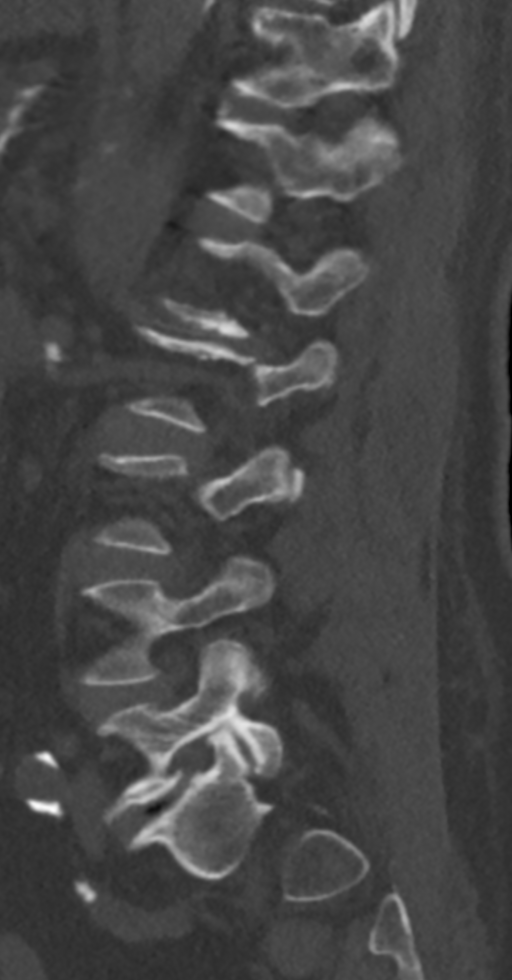

[10 of 33 positions shown; findings below may reference images not displayed]

FINDINGS: Segmentation: Normal segmentation. Lowest well-formed disc labeled
the L5-S1 level.

Alignment: Trace 2 mm retrolisthesis of L1 on L2 and L5 on S1.
Alignment otherwise normal with preservation of the normal lumbar
lordosis. No listhesis or subluxation.

Vertebrae: Vertebral body heights maintained without evidence for
acute or chronic fracture. Small chronic endplate Schmorl's nodes
noted at the superior endplate of L2 and inferior endplate of L4. No
discrete or worrisome osseous lesions. Visualized sacrum and pelvis
intact. SI joints approximated and symmetric.

Paraspinal and other soft tissues: Paraspinous soft tissues
demonstrate no acute abnormality. No hematoma or other complication
status post recent lumbar puncture. No epidural collections.
Prominent aorto bi-iliac atherosclerotic disease noted. Visualized
visceral structures otherwise unremarkable.

Disc levels:

L1-2: Chronic intervertebral disc space narrowing with diffuse disc
bulge and disc desiccation. Reactive endplate changes with marginal
endplate osteophytic spurring. Mild facet hypertrophy. Resultant
mild spinal stenosis. Mild left L1 foraminal narrowing.

L2-3: Mild diffuse disc bulge with intervertebral disc space
narrowing. Mild bilateral facet hypertrophy. No significant spinal
stenosis. Mild bilateral L2 foraminal stenosis.

L3-4: Diffuse disc bulge with intervertebral disc space narrowing.
Mild bilateral facet hypertrophy. Resultant mild bilateral lateral
recess stenosis. Mild to moderate bilateral L3 foraminal narrowing.

L4-5: Chronic intervertebral disc space narrowing with diffuse disc
bulge and disc desiccation. Moderate facet and ligament flavum
hypertrophy. Resultant moderate canal with moderate to severe
bilateral lateral recess narrowing. Moderate to severe bilateral L4
foraminal narrowing, left greater than right.

L5-S1: Chronic intervertebral disc space narrowing with diffuse disc
bulge and disc desiccation. Moderate bilateral facet hypertrophy. No
significant spinal stenosis. Severe bilateral L5 foraminal
narrowing.
IMPRESSION: 1. No acute abnormality identified within the lumbar spine. No
complication status post recent lumbar puncture.
2. Disc bulge with facet hypertrophy at L4-5 with resultant moderate
to severe bilateral lateral recess narrowing with bilateral L4
foraminal stenosis.
3. Severe bilateral L5 foraminal narrowing related to disc
osteophyte and facet hypertrophy.
4. Additional more mild non-compressive degenerative disc bulging
and facet hypertrophy elsewhere within the lumbar spine as detailed
above.

## 2018-12-19 ENCOUNTER — Other Ambulatory Visit: Payer: Self-pay | Admitting: Cardiovascular Disease

## 2019-05-03 ENCOUNTER — Other Ambulatory Visit: Payer: Self-pay | Admitting: Pharmacist Clinician (PhC)/ Clinical Pharmacy Specialist

## 2019-05-03 MED ORDER — REPATHA SURECLICK 140 MG/ML ~~LOC~~ SOAJ: 140.0000 mg | SUBCUTANEOUS | 11 refills | Status: DC

## 2019-05-29 ENCOUNTER — Other Ambulatory Visit: Payer: Self-pay | Admitting: Cardiovascular Disease

## 2019-06-05 ENCOUNTER — Other Ambulatory Visit: Payer: Self-pay

## 2019-06-05 ENCOUNTER — Encounter: Payer: Self-pay | Admitting: Neurology

## 2019-06-05 ENCOUNTER — Ambulatory Visit: Payer: Medicare Other | Admitting: Neurology

## 2019-06-05 DIAGNOSIS — Z8669 Personal history of other diseases of the nervous system and sense organs: Secondary | ICD-10-CM | POA: Diagnosis not present

## 2019-06-05 HISTORY — DX: Personal history of other diseases of the nervous system and sense organs: Z86.69

## 2019-06-05 NOTE — Progress Notes (Signed)
Reason for visit: History of Guillain-Barr syndrome  Henry Morrison is an 74 y.o. male  History of present illness:  Mr. Sieve is a 74 year old right-handed white male with a history of Guillain-Barr syndrome, he has essentially fully recovered.  He still has some residual tingling in the toes at times if he walks longer distances.  The patient does have an essential tremor affecting both arms, this is not disabling for him.  He denies any significant issues with balance, he has not had any falls.  His Guillain-Barr syndrome developed within 2 weeks following a flu vaccination, he has been told never to have a flu vaccination again as per [employer] of [employer] guidelines.  He returns to the office today wondering whether he should get the Covid vaccination that is currently available.  Past Medical History:  Diagnosis Date  . Arthritis   . CAD (coronary artery disease)   . Cancer ([place])    skin cancer   . Hyperlipidemia   . Primary localized osteoarthritis of right knee 06/09/2015    Past Surgical History:  Procedure Laterality Date  . COLONOSCOPY    . CORONARY ANGIOPLASTY WITH STENT PLACEMENT  10/2000   stenting of the CX vessel  . KNEE ARTHROSCOPY W/ MENISCECTOMY Left 07/15/2006  . KNEE ARTHROSCOPY W/ MENISCECTOMY Right 02/09/2012  . removal of skin cancer     right arm  . TOTAL KNEE ARTHROPLASTY Right 06/09/2015   Procedure: RIGHT TOTAL KNEE ARTHROPLASTY;  Surgeon: Elsie Saas, MD;  Location: [place];  Service: Orthopedics;  Laterality: Right;  . US ECHOCARDIOGRAPHY  03/07/2012   Trace AI,mild MR,mild to mod TR    Family History  Problem Relation Age of Onset  . Heart disease Mother   . Cancer Father        kidney    Social history:  reports that he has never smoked. He has never used smokeless tobacco. He reports current alcohol use of about 10.0 standard drinks of alcohol per week. He reports that he does not use drugs.    Allergies  Allergen Reactions  .  Influenza Vaccines     Guillain-Barr  . Other     Seasonal allergies    Medications:  Prior to Admission medications   Medication Sig Start Date End Date Taking? Authorizing Provider  acetaminophen (TYLENOL) 325 MG tablet Take 2 tablets (650 mg total) by mouth every 6 (six) hours as needed for mild pain, fever or headache. 02/09/18  Yes Elgergawy, Silver Huguenin, MD  atorvastatin (LIPITOR) 80 MG tablet TAKE 1 TABLET BY MOUTH EVERY DAY 09/20/18  Yes Troy Sine, MD  ezetimibe (ZETIA) 10 MG tablet TAKE 1 TABLET BY MOUTH EVERY DAY 12/19/18  Yes Troy Sine, MD  fluticasone (FLONASE) 50 MCG/ACT nasal spray Place 1 spray into both nostrils as needed for allergies.  05/24/13  Yes [provider]  isosorbide mononitrate (IMDUR) 30 MG 24 hr tablet TAKE 1 TABLET BY MOUTH EVERY DAY 12/19/18  Yes Troy Sine, MD  metoprolol succinate (TOPROL-XL) 25 MG 24 hr tablet TAKE 1 TABLET BY MOUTH EVERY DAY 09/20/18  Yes Troy Sine, MD  Multiple Vitamin (MULTIVITAMIN) tablet Take 1 tablet by mouth daily.   Yes [provider]  ramipril (ALTACE) 10 MG capsule TAKE ONE CAPSULE BY MOUTH EVERY DAY 12/19/18  Yes Troy Sine, MD  REPATHA SURECLICK [id number] MG/ML SOAJ ADMINISTER 1 ML UNDER THE SKIN EVERY 14 DAYS 05/30/19  Yes Troy Sine, MD  ROS:  Out of a complete 14 system review of symptoms, the patient complains only of the following symptoms, and all other reviewed systems are negative.  Numbness in the feet  Blood pressure 132/78, pulse 68, temperature (!) 97.3 F (36.3 C), height 5' 10.5" (1.791 m), weight 200 lb 8 oz (90.9 kg).  Physical Exam  General: The patient is alert and cooperative at the time of the examination.  Skin: No significant peripheral edema is noted.   Neurologic Exam  Mental status: The patient is alert and oriented x 3 at the time of the examination. The patient has apparent normal recent and remote memory, with an apparently normal attention span and  concentration ability.   Cranial nerves: Facial symmetry is present. Speech is normal, no aphasia or dysarthria is noted. Extraocular movements are full. Visual fields are full.  Motor: The patient has good strength in all 4 extremities.  Sensory examination: Soft touch sensation is symmetric on the face, arms, and legs.  Coordination: The patient has good finger-nose-finger and heel-to-shin bilaterally.  Gait and station: The patient has a normal gait. Tandem gait is normal. Romberg is negative. No drift is seen.  The patient is able to walk on the heels and the toes bilaterally.  Reflexes: Deep tendon reflexes are symmetric, but are depressed.   Assessment/Plan:  1.  History of Guillain-Barr syndrome  The patient did have Guillain-Barre associated with a flu vaccination previously.  There is no medical information in regards to risk of recurrence with the Covid vaccination, but I have indicated to the patient that the risk/benefit ratio likely favors getting the Covid vaccination.  Without the vaccination, the patient is likely to become infected with a virus which may have some risk of mortality and long-term morbidity.  The viral infection itself is likely due to be associated with multiple antigens, increasing the likelihood of multiple antigenic responses rather than a single response to a surface protein with the vaccination.  The patient will seek out the vaccination.  He will follow-up here if needed.  Greater than 50% of the visit was spent in counseling and coordination of care.  Face-to-face time with the patient was 20 minutes.   Jill Alexanders MD 06/05/2019 10:21 AM  [facility] [address] [place], [place] [address]  Phone [phone] Fax [phone]

## 2019-08-08 ENCOUNTER — Telehealth: Payer: Self-pay | Admitting: Cardiovascular Disease

## 2019-08-08 NOTE — Telephone Encounter (Signed)
Patient would like to speak to Dr. Claiborne Billings.

## 2019-08-08 NOTE — Telephone Encounter (Signed)
Spoke with patient. Patient calling to ask Dr. Claiborne Billings to see his daughter Henry Morrison 05/01/77 and to save her life as he did for him in the past. He reports her cholesterol is over 400 and he would like for her to be seen. No appointments available for new patients at this time. Patient requesting to speak with Dr. Claiborne Billings. Will route to Dr. Claiborne Billings and nurse Minette Brine for review.

## 2019-08-10 NOTE — Telephone Encounter (Signed)
Called and rescheduled pts appt for 10/22/19 at 4:40pm  Pt verbalized understanding. No other questions at this time.

## 2019-08-10 NOTE — Telephone Encounter (Signed)
I will most likely working myself into the schedule one of the weeks and may but is not finalized yet.  I can see them sooner June can add them onto one of my quarter or half days

## 2019-08-10 NOTE — Telephone Encounter (Signed)
Returned pts call for scheduling appt for his daughter. Notified that Dr.Kelly has no appts available for new patients at this time. Pt stated he has an upcoming appt on 09/18/19 that he would like to give up for his daughter to have. He states this is his yearly appt but he wants her to have it and he can be rescheduled. Notified that this isn't how we usually do things but he was adamant. He states she has seen Dr.Kelly before and that she needs to see him soon. Notified him to have her call into the office to schedule this appt. Pt verbalized understanding no other questions at this time.

## 2019-09-15 ENCOUNTER — Other Ambulatory Visit: Payer: Self-pay | Admitting: Cardiovascular Disease

## 2019-09-18 ENCOUNTER — Ambulatory Visit: Payer: Medicare Other | Admitting: Cardiovascular Disease

## 2019-09-18 ENCOUNTER — Other Ambulatory Visit: Payer: Self-pay | Admitting: Cardiovascular Disease

## 2019-10-09 ENCOUNTER — Ambulatory Visit: Payer: Medicare Other | Admitting: Cardiovascular Disease

## 2019-10-22 ENCOUNTER — Ambulatory Visit: Payer: Medicare Other | Admitting: Cardiovascular Disease

## 2019-10-22 ENCOUNTER — Encounter: Payer: Self-pay | Admitting: Cardiovascular Disease

## 2019-10-22 ENCOUNTER — Other Ambulatory Visit: Payer: Self-pay

## 2019-10-22 DIAGNOSIS — Z8669 Personal history of other diseases of the nervous system and sense organs: Secondary | ICD-10-CM

## 2019-10-22 DIAGNOSIS — E7849 Other hyperlipidemia: Secondary | ICD-10-CM | POA: Diagnosis not present

## 2019-10-22 DIAGNOSIS — I10 Essential (primary) hypertension: Secondary | ICD-10-CM | POA: Diagnosis not present

## 2019-10-22 DIAGNOSIS — Z9229 Personal history of other drug therapy: Secondary | ICD-10-CM

## 2019-10-22 DIAGNOSIS — I2510 Atherosclerotic heart disease of native coronary artery without angina pectoris: Secondary | ICD-10-CM

## 2019-10-22 NOTE — Patient Instructions (Signed)

## 2019-10-22 NOTE — Progress Notes (Signed)
Patient ID: Henry Morrison, male   DOB: Mar 06, 1946, 74 y.o.   MRN: [id number]    Primary M.D.: Henry Morrison  HPI: Henry Morrison is a 74 y.o. male who presents to the office for a 15 month cardiology evaluation.   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  At that time, he had told me that he had first been told of having high cholesterol approximatey 8 years previously with cholesterols in excess of 400, but he was not on any therapy.  He had undergone a routine treadmill test which reportely was normal by Henry Morrison.  Of note, his mother had very high cholesterols throughout her life and died of a myocardial infarction at age 72. When the patient was initially referred to me, his laboratory was notable for a total cholesterol of 510 and LDL cholesterol of 422.  Since my initial evaluation, he was started on aspirin therapy as well as initial statin treatment with simvastatin..  As more aggressive statins became available, he ultimately was treated with atorvastatin 80 mg in addition to Zetia 10 mg. Of note a review of his medical record indicates that there was a short period of time when he was traveling excessively and had stopped taking therapy. Subsequent blood work in 1999 upon his return showed his cholesterol had risen back to 343 with an LDL cholesterol of 245.   A nuclear perfusion study in 2002 demonstrated new development of inferolateral ischemia. In June 2002 he underwent stenting of a subtotal occlusion of a circumflex vessel which time a 2.75x23 mm HepaCoat stent was inserted. 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 A nuclear perfusion study in October 2011 continued to show normal  perfusion without scar or ischemia.   He remains active. 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  He has been maintained on atorvastatin 80 mg and Zetia 10 mg for his marked hyperlipidemia. His blood pressure has been treated with metoprolol succinate 25 mg as well as altace 10 mg. He also is on low dose isosorbide mononitrate and has not been experiencing any recurrent anginal symptoms.  Blood work done on 06/17/2014:  total cholesterol 194, triglycerides 72, HDL 66, and LDL cholesterol is 114. Liver tests are normal. Renal function is normal with a creatinine of 0.8.  Fasting glucose was 94.  Prior to undergoing knee replacement surgery  he underwent a nuclear perfusion study on 08/06/2014.  This remained low risk and did not demonstrate scar or ischemia.  He had normal LV function.  He underwent his knee replacement in January 2017 and has done well from a cardiovascular standpoint.  After seeing me in 2016, I had a long discussion with the patient and felt he was a candidate for PCSK-9 inhibition therapy.  He has been on Prilosec when 75 mg every 14 days since the spring of 2016.  This has resulted in dramatic benefit and lab work on 12/24/2014 showed a total cholesterol of 150 with an LDL cholesterol of 43, and blood work on 04/14/2015 revealed a total cholesterol 141 and LDL cholesterol at 36.  HDL was 93, VLDL 12, and  triglycerides 59.  Subsequent laboratory continued to show improvement in LDL and in January 2018 LDL had reduced to 24.  He recently underwent subsequent blood work on 07/09/2016 which showed an LDL of 57.  He continues to be on atorvastatin 80 mg, Zetia 10 mg, and apparently is only on the reduced dose of Praluent at 75 mg every 2 weeks.    I recommended he increase Praluent to 150 mg since his LDL had increased into the 40s.  Subsequent blood work demonstrated significant improvement with LDL back down to 24.  I saw him in  March 2019 at which time he was now fully retired. He was staying active with his grandchildren and travels.  He denies recurrent chest pain, PND, orthopnea. He had just started on Medicare,  as result , as result, he now has to pay for PCSK9 whereas previously his commercial insurance paid 100%.  Because of this and the recently reduced cost of Repatha, which would result in a lower Medicare co-pay cost he was switched to Henry Morrison. .  He has tolerated this well, but his co-pay is still over $100 per month.  He continues to be on concomitant therapy with atorvastatin 80 mg and Zetia 10 mg.  He is not having any anginal symptoms on isosorbide 30 mg and Toprol 25 mg.  He  underwent a three-year f/u nuclear perfusion study on 08/01/2017 which remained entirely normal.  He presents for reevaluation.  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  He initially noticed tingling in his feet day after the flu shot the following day collapsed while in the shower.  He was unable to ambulate.  While hospitalized he underwent a 5-day course of IVIG and lumbar puncture showed modest elevation in the spinal fluid protein.  He completed inpatient rehabilitation and ultimately has sniffily improved.  It took him 3 months to be able to button his shirt.  He still notes some occasional tingling of his feet.   I last saw him in March 2020 which time he was remaining fairly active and denied any episodes of angina.  He continues to work in his yard and with her projects without discomfort or shortness of breath.  He keeps active with his grand children and great-grandchildren.  He is unaware of palpitations.  He is tolerating Repatha well without side effects.  He continues to be on atorvastatin 80 mg and Zetia 10 mg.  His t lipid panel on July 24, 2018 at [facility] has shown an LDL cholesterol at 41.  This has risen slightly compared  to 1 year ago when his LDL was 24 and on another lab where it was 87.  He had questioned whether or not his atorvastatin dose could be reduced.  He continues to be on ramipril 10 mg, Toprol-XL 25 mg, and isosorbide mononitrate 30 mg for his CAD and there is no angina on therapy.   Over the past year, continue to be asymptomatic.  He underwent repeat blood work in March 2021 by Henry. Sharlett Iles at [facility].  Total cholesterol was 109, LDL cholesterol 33, triglycerides 80 and HDL 60.  TSH was normal.  LFTs were normal.  He continues to be on atorvastatin 80 mg, Zetia 10 mg and Repatha 140 migrans per mL every 2 weeks.  He is not having angina on isosorbide 30 mg in addition to metoprolol succinate 25 mg daily.  He also is on ramipril for blood pressure control.  He presents for follow-up evaluation.  Past Medical History:  Diagnosis Date  . Arthritis   . CAD (coronary artery disease)   . Cancer ([place])    skin cancer   . 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 Hyperlipidemia   . Primary localized osteoarthritis of right knee 06/09/2015    Past Surgical History:  Procedure Laterality Date  . COLONOSCOPY    . CORONARY ANGIOPLASTY WITH STENT PLACEMENT  10/2000   stenting of the CX vessel  . KNEE ARTHROSCOPY W/ MENISCECTOMY Left 07/15/2006  . KNEE ARTHROSCOPY W/ MENISCECTOMY Right 02/09/2012  . removal of skin cancer     right arm  . TOTAL KNEE ARTHROPLASTY Right 06/09/2015   Procedure: RIGHT TOTAL KNEE ARTHROPLASTY;  Surgeon: Elsie Saas, MD;  Location: [facility];  Service: Orthopedics;  Laterality: Right;  . US ECHOCARDIOGRAPHY  03/07/2012   Trace AI,mild MR,mild to mod TR    Allergies  Allergen Reactions  . Influenza Vaccines     Guillain-Morrison  . Other     Seasonal allergies    Current Outpatient Medications  Medication Sig Dispense Refill  . acetaminophen (TYLENOL) 325 MG tablet Take 2 tablets (650 mg total) by mouth every 6 (six) hours as needed for mild pain, fever or  headache.    Marland Kitchen atorvastatin (LIPITOR) 80 MG tablet Take 1 tablet (80 mg total) by mouth daily. Please keep upcoming appointment for further refills. 30 tablet 0  . ezetimibe (ZETIA) 10 MG tablet TAKE 1 TABLET BY MOUTH EVERY DAY 90 tablet 3  . fluticasone (FLONASE) 50 MCG/ACT nasal spray Place 1 spray into both nostrils as needed for allergies.     . isosorbide mononitrate (IMDUR) 30 MG 24 hr tablet TAKE 1 TABLET BY MOUTH EVERY DAY 90 tablet 3  . metoprolol succinate (TOPROL-XL) 25 MG 24 hr tablet Take 1 tablet (25 mg total) by mouth daily. Please keep upcoming appointment for further refills. 30 tablet 0  . Multiple Vitamin (MULTIVITAMIN) tablet Take 1 tablet by mouth daily.    . ramipril (ALTACE) 10 MG capsule TAKE ONE CAPSULE BY MOUTH EVERY DAY 90 capsule 3  . REPATHA SURECLICK 281 MG/ML SOAJ ADMINISTER 1 ML UNDER THE SKIN EVERY 14 DAYS 2 mL 11   No current facility-administered medications for this visit.    Social History   Socioeconomic History  . Marital status: Married    Spouse name: Not on file  . Number of children: Not on file  . Years of education: Not on file  . Highest education level: Not on file  Occupational History  . Not on file  Tobacco Use  . Smoking status: Never Smoker  . Smokeless tobacco: Never Used  Substance and Sexual Activity  . Alcohol use: Yes    Alcohol/week: 10.0 standard drinks    Types: 10 Standard drinks or equivalent per week    Comment: wine 2 galsses each day  . Drug use: No  . Sexual activity: Not on file  Other Topics Concern  . Not on file  Social History Narrative  . Not on file   Social Determinants of Health   Financial Resource Strain:   . Difficulty of Paying Living Expenses:   Food Insecurity:   . Worried About Charity fundraiser in the Last Year:   . Arboriculturist in the Last Year:   Transportation Needs:   . Film/video editor (Medical):   Marland Kitchen  Lack of Transportation (Non-Medical):   Physical Activity:   . Days of  Exercise per Week:   . Minutes of Exercise per Session:   Stress:   . Feeling of Stress :   Social Connections:   . Frequency of Communication with Friends and Family:   . Frequency of Social Gatherings with Friends and Family:   . Attends Religious Services:   . Active Member of Clubs or Organizations:   . Attends Archivist Meetings:   Marland Kitchen Marital Status:   Intimate Partner Violence:   . Fear of Current or Ex-Partner:   . Emotionally Abused:   Marland Kitchen Physically Abused:   . Sexually Abused:     Family History  Problem Relation Age of Onset  . Heart disease Mother   . Cancer Father        kidney    ROS General: Negative; No fevers, chills, or night sweats;  HEENT: Negative; No changes in vision or hearing, sinus congestion, difficulty swallowing Pulmonary: Negative; No cough, wheezing, shortness of breath, hemoptysis Cardiovascular: Negative; No chest pain, presyncope, syncope, palpitations GI: Negative; No nausea, vomiting, diarrhea, or abdominal pain GU: Negative; No dysuria, hematuria, or difficulty voiding Musculoskeletal: Negative; no myalgias, joint pain, or weakness Hematologic/Oncology: Negative; no easy bruising, bleeding Endocrine: Negative; no heat/cold intolerance; no diabetes Neuro: Negative; no changes in balance, headaches Skin:  History of melanoma resection Psychiatric: Negative; No behavioral problems, depression Sleep: Negative; No snoring, daytime sleepiness, hypersomnolence, bruxism, restless legs, hypnogognic hallucinations, no cataplexy Other comprehensive 14 point system review is negative.   PE BP 138/72   Pulse (!) 58   Ht 5' 10"  (1.778 m)   Wt 195 lb (88.5 kg)   BMI 27.98 kg/m   Repeat blood pressure by me was 138/72 as well  Wt Readings from Last 3 Encounters:  10/22/19 195 lb (88.5 kg)  06/05/19 200 lb 8 oz (90.9 kg)  08/14/18 194 lb 3.2 oz (88.1 kg)    General: Alert, oriented, no distress.  Skin: normal turgor, no rashes,  warm and dry HEENT: Normocephalic, atraumatic. Pupils equal round and reactive to light; sclera anicteric; extraocular muscles intact; Fundi ** Nose without nasal septal hypertrophy Mouth/Parynx benign; Mallinpatti scale Neck: No JVD, no carotid bruits; normal carotid upstroke Lungs: clear to ausculatation and percussion; no wheezing or rales Chest wall: without tenderness to palpitation Heart: PMI not displaced, RRR, s1 s2 normal, 1/6 systolic murmur, no diastolic murmur, no rubs, gallops, thrills, or heaves Abdomen: soft, nontender; no hepatosplenomehaly, BS+; abdominal aorta nontender and not dilated by palpation. Back: no CVA tenderness Pulses 2+ Musculoskeletal: full range of motion, normal strength, no joint deformities Extremities: no clubbing cyanosis or edema, Homan's sign negative  Neurologic: grossly nonfocal; Cranial nerves grossly wnl Psychologic: Normal mood and affect   ECG (independently read by me): Sinus bradycardia 58 bpm.  Normal intervals.  No ectopy  March 2020 ECG (independently read by me): Sinus bradycardia 59 bpm.  Isolated PVC.  Normal intervals.  March 2018 ECG (independently read by me): sinus bradycardia 54 bpm.  Normal intervals.  No ST segment changes.  July 2017 ECG (independently read by me): Normal sinus rhythm at 78 bpm.  Normal intervals.  No significant ST segment changes.  February 2016 ECG (independently read by me): Sinus bradycardia 54 bpm.  Normal intervals.  No ST segment changes.  January 2015ECG (independently read by me): Normal sinus rhythm. No significant ST-T changes. PoorR wave progression.  LABS: BMP Latest Ref Rng & Units  02/13/2018 02/12/2018 02/11/2018  Glucose 70 - 99 mg/dL 95 120(H) 100(H)  BUN 8 - 23 mg/dL 32(H) 33(H) 47(H)  Creatinine 0.61 - 1.24 mg/dL 1.00 0.98 1.23  Sodium 135 - 145 mmol/L 134(L) 134(L) 133(L)  Potassium 3.5 - 5.1 mmol/L 4.6 4.3 4.8  Chloride 98 - 111 mmol/L 103 102 103  CO2 22 - 32 mmol/L 26 25 23     Calcium 8.9 - 10.3 mg/dL 9.9 9.7 9.5   Hepatic Function Latest Ref Rng & Units 02/10/2018 02/07/2018 02/03/2018  Total Protein 6.5 - 8.1 g/dL 8.7(H) - 6.9  Albumin 3.5 - 5.0 g/dL 3.4(L) 3.3(L) 4.0  AST 15 - 41 U/L 81(H) - 36  ALT 0 - 44 U/L 137(H) - 44  Alk Phosphatase 38 - 126 U/L 80 - 74  Total Bilirubin 0.3 - 1.2 mg/dL 1.1 - 0.6  Bilirubin, Direct 0.00 - 0.40 mg/dL - - -   CBC Latest Ref Rng & Units 02/10/2018 02/09/2018 02/07/2018  WBC 4.0 - 10.5 K/uL 5.4 8.4 5.9  Hemoglobin 13.0 - 17.0 g/dL 14.0 14.1 13.4  Hematocrit 39.0 - 52.0 % 42.4 42.8 41.7  Platelets 150 - 400 K/uL 227 298 238   Lab Results  Component Value Date   MCV 91.4 02/10/2018   MCV 91.5 02/09/2018   MCV 92.7 02/07/2018   Lab Results  Component Value Date   TSH 1.369 02/03/2018    Lab Results  Component Value Date   HGBA1C 6.2 (H) 02/03/2018   Lipid Panel     Component Value Date/Time   CHOL 156 03/29/2018 0847   TRIG 67 03/29/2018 0847   HDL 106 03/29/2018 0847   CHOLHDL 1.5 03/29/2018 0847   CHOLHDL 1.4 05/19/2016 0807   VLDL 13 05/19/2016 0807   LDLCALC 37 03/29/2018 0847   Blood work by Henry. Sharlett Iles of [place] from 07/09/2016 was reviewed Cholesterol 125, triglycerides 71, HDL 54, LDL 57.  I reviewed most recent laboratory from [facility] collected on 03/12/2018.  Total cholesterol was 125, triglycerides 86, HDL 66, LDL 42.   Most recent laboratory from July 24, 2018 from [facility] were reviewed. Lester 111, triglycerides 70, HDL 56, LDL 41. Glucose 97.  BUN 26 creatinine 0.8.  LFTs normal. Hemoglobin 13.8 hematocrit 42.9. PSA had increased from 2.3-4.345 and he was given a prescription for Cipro pro 500 mg twice a day with plans to recheck a PSA in 3 weeks  IMPRESSION:  1. Coronary artery disease involving native coronary artery of native heart without angina pectoris   2. Essential hypertension   3. Familial hyperlipidemia, high LDL   4.  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 Over the last 25 years, I have been aggressively managing his lipid studies.  In 2002 he was found to have a subtotally occluded obtuse marginal 1 stenosis after nuclear study suggested inferolateral ischemia. He underwent successful intervention with stenting of a subtotally occluded circumflex vessel. There was concomitant CAD, also involving his LAD and RCA.  A nuclear study done in 2016 for preoperative clearance prior to his knee surgery revealed continued normal perfusion without scar or ischemia. I reviewed his 3 year follow-up nuclear study with him in detail today.  This was entirely normal. Based on the [place] diagnostic criteria he has familial hypercholesterolemia with a score  of at least 13.  He has been on PCSK9 inhibition since 2016.  He has continued to take atorvastatin 80 mg and Zetia 10 mg.   Since starting therapy, his LDL cholesterols have consistently been under 50 and had been in the mid 20s on several occasions.  He  switched to Repatha due to cost continues to be on 140 mg every 2 weeks.  I reviewed his most recent laboratory done on August 03, 2019 at [place].  Lipid studies continue to be outstanding with a total cholesterol 109, triglycerides 80, HDL 60 and LDL 33.  His blood pressure today is stable but systolic blood pressure is in the 086P although diastolics in the 61P.  He continues to be on ramipril 10 mg daily in addition to isosorbide 25 mg daily.  He is bradycardic with resting pulse of 58.  I advised him to continue to monitor his blood pressure.  I discussed with him target blood pressure  is less than 509 systolic.  He does not have any anginal symptoms.  He did get the COVID-19 vaccination and tolerated this well.  Remotely  unfortunately he had developed Guillain-Morrison syndrome after getting an influenza vaccine and with new mRNA technology for the Covid vaccine he did not get any attenuated live virus.  His weight is stable with a BMI of 27.98.  He continues to be active.  As long as he is stable I will see him in 1 year for reevaluation or sooner as needed.  Henry Sine, MD, [facility]  10/24/2019 10:47 PM

## 2019-10-24 ENCOUNTER — Other Ambulatory Visit: Payer: Self-pay | Admitting: Cardiovascular Disease

## 2019-10-24 ENCOUNTER — Encounter: Payer: Self-pay | Admitting: Cardiovascular Disease

## 2019-12-10 ENCOUNTER — Other Ambulatory Visit: Payer: Self-pay | Admitting: Cardiovascular Disease

## 2020-06-04 ENCOUNTER — Other Ambulatory Visit: Payer: Self-pay | Admitting: Cardiovascular Disease

## 2020-06-11 ENCOUNTER — Telehealth: Payer: Self-pay | Admitting: Cardiovascular Disease

## 2020-06-11 NOTE — Telephone Encounter (Signed)
*  STAT* If patient is at the pharmacy, call can be transferred to refill team.   1. Which medications need to be refilled? (please list name of each medication and dose if known)  metoprolol succinate (TOPROL-XL) 25 MG 24 hr tablet   2. Which pharmacy/location (including street and city if local pharmacy) is medication to be sent to? [id number] - [place], [place] [address]. Do they need a 30 day or 90 day supply? [place]

## 2020-06-11 NOTE — Telephone Encounter (Signed)
Rx has been sent to the pharmacy electronically. 01/19  

## 2020-06-12 ENCOUNTER — Other Ambulatory Visit: Payer: Self-pay | Admitting: Cardiovascular Disease

## 2020-06-13 ENCOUNTER — Other Ambulatory Visit: Payer: Self-pay | Admitting: Cardiovascular Disease

## 2020-09-08 ENCOUNTER — Telehealth: Payer: Self-pay | Admitting: Cardiovascular Disease

## 2020-09-08 MED ORDER — METOPROLOL SUCCINATE ER 25 MG PO TB24: 1.0000 | ORAL_TABLET | Freq: Every day | ORAL | 0 refills | Status: DC

## 2020-09-08 NOTE — Telephone Encounter (Signed)
*  STAT* If patient is at the pharmacy, call can be transferred to refill team.   1. Which medications need to be refilled? (please list name of each medication and dose if known) metoprolol succinate (TOPROL-XL) 25 MG 24 hr tablet  2. Which pharmacy/location (including street and city if local pharmacy) is medication to be sent to? [facility] - [place], [place] - [address]  3. Do they need a 30 day or 90 day supply? 90 day supply   

## 2020-10-23 ENCOUNTER — Ambulatory Visit: Payer: Medicare Other | Admitting: Cardiovascular Disease

## 2020-10-23 ENCOUNTER — Other Ambulatory Visit: Payer: Self-pay

## 2020-10-23 ENCOUNTER — Encounter: Payer: Self-pay | Admitting: Cardiovascular Disease

## 2020-10-23 VITALS — BP 136/76 | HR 54 | Ht 70.0 in | Wt 195.2 lb

## 2020-10-23 DIAGNOSIS — I10 Essential (primary) hypertension: Secondary | ICD-10-CM

## 2020-10-23 DIAGNOSIS — E7849 Other hyperlipidemia: Secondary | ICD-10-CM | POA: Diagnosis not present

## 2020-10-23 DIAGNOSIS — Z8669 Personal history of other diseases of the nervous system and sense organs: Secondary | ICD-10-CM

## 2020-10-23 DIAGNOSIS — I2510 Atherosclerotic heart disease of native coronary artery without angina pectoris: Secondary | ICD-10-CM

## 2020-10-23 DIAGNOSIS — Z9229 Personal history of other drug therapy: Secondary | ICD-10-CM

## 2020-10-23 NOTE — Progress Notes (Addendum)
Patient ID: Henry Morrison, male   DOB: November 03, 1945, 75 y.o.   MRN: [id number]    Primary M.D.: Dr. Bevelyn Buckles  HPI: Henry Morrison is a 75 y.o. male who presents to the office for a 12 month cardiology evaluation.   Mr. 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  He was initially referred to me 25 years ago in 1996 when he was noted to have an a Hollenhorst plaque on eye examination by Dr Sabra Heck as well as arcus cornealis.  At that time, he had told me that he had first been told of having high cholesterol approximatey 8 years previously with cholesterols in excess of 400, but he was not on any therapy.  He had undergone a routine treadmill test which reportely was normal by Dr. Sherald Barge.  Of note, his mother had very high cholesterols throughout her life and died of a myocardial infarction at age 46. When the patient was initially referred to me, his laboratory was notable for a total cholesterol of 510 and LDL cholesterol of 422.  Since my initial evaluation, he was started on aspirin therapy as well as initial statin treatment with simvastatin..  As more aggressive statins became available, he ultimately was treated with atorvastatin 80 mg in addition to Zetia 10 mg. Of note a review of his medical record indicates that there was a short period of time when he was traveling excessively and had stopped taking therapy. Subsequent blood work in 1999 upon his return showed his cholesterol had risen back to 343 with an LDL cholesterol of 245.   A nuclear perfusion study in 2002 demonstrated new development of inferolateral ischemia. In June 2002 he underwent stenting of a subtotal occlusion of a circumflex vessel which time a 2.75x23 mm HepaCoat stent was inserted. 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 A nuclear perfusion study in October 2011 continued to show normal  perfusion without scar or ischemia.   He remains active. 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  He has been maintained on atorvastatin 80 mg and Zetia 10 mg for his marked hyperlipidemia. His blood pressure has been treated with metoprolol succinate 25 mg as well as altace 10 mg. He also is on low dose isosorbide mononitrate and has not been experiencing any recurrent anginal symptoms.  Blood work done on 06/17/2014:  total cholesterol 194, triglycerides 72, HDL 66, and LDL cholesterol is 114. Liver tests are normal. Renal function is normal with a creatinine of 0.8.  Fasting glucose was 94.  Prior to undergoing knee replacement surgery  he underwent a nuclear perfusion study on 08/06/2014.  This remained low risk and did not demonstrate scar or ischemia.  He had normal LV function.  He underwent his knee replacement in January 2017 and has done well from a cardiovascular standpoint.  After seeing me in 2016, I had a long discussion with the patient and felt he was a candidate for PCSK-9 inhibition therapy.  He has been on Prilosec when 75 mg every 14 days since the spring of 2016.  This has resulted in dramatic benefit and lab work on 12/24/2014 showed a total cholesterol of 150 with an LDL cholesterol of 43, and blood work on 04/14/2015 revealed a total cholesterol 141 and LDL cholesterol at 36.  HDL was 93, VLDL 12, and  triglycerides 59.  Subsequent laboratory continued to show improvement in LDL and in January 2018 LDL had reduced to 24.  He recently underwent subsequent blood work on 07/09/2016 which showed an LDL of 57.  He continues to be on atorvastatin 80 mg, Zetia 10 mg, and apparently is only on the reduced dose of Praluent at 75 mg every 2 weeks.    I recommended he increase Praluent to 150 mg since his LDL had increased into the 40s.  Subsequent blood work demonstrated significant improvement with LDL back down to 24.  I saw him in  March 2019 at which time he was now fully retired. He was staying active with his grandchildren and travels.  He denies recurrent chest pain, PND, orthopnea. He had just started on Medicare,  as result , as result, he now has to pay for PCSK9 whereas previously his commercial insurance paid 100%.  Because of this and the recently reduced cost of Repatha, which would result in a lower Medicare co-pay cost he was switched to [place]. .  He has tolerated this well, but his co-pay is still over $100 per month.  He continues to be on concomitant therapy with atorvastatin 80 mg and Zetia 10 mg.  He is not having any anginal symptoms on isosorbide 30 mg and Toprol 25 mg.  He  underwent a three-year f/u nuclear perfusion study on 08/01/2017 which remained entirely normal.  He presents for reevaluation.  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  He initially noticed tingling in his feet day after the flu shot the following day collapsed while in the shower.  He was unable to ambulate.  While hospitalized he underwent a 5-day course of IVIG and lumbar puncture showed modest elevation in the spinal fluid protein.  He completed inpatient rehabilitation and ultimately has sniffily improved.  It took him 3 months to be able to button his shirt.  He still notes some occasional tingling of his feet.   I saw him in March 2020 which time he was remaining fairly active and denied any episodes of angina.  He continues to work in his yard and with her projects without discomfort or shortness of breath.  He keeps active with his grand children and great-grandchildren.  He is unaware of palpitations.  He is tolerating Repatha well without side effects.  He continues to be on atorvastatin 80 mg and Zetia 10 mg.  His t lipid panel on July 24, 2018 at [place] has shown an LDL cholesterol at 41.  This has risen slightly compared to 1  year ago when his LDL was 24 and on another lab where it was 87.  He had questioned whether or not his atorvastatin dose could be reduced.  He continues to be on ramipril 10 mg, Toprol-XL 25 mg, and isosorbide mononitrate 30 mg for his CAD and there is no angina on therapy.   I last saw him in June 2021 and over the prior year he continued to be asymptomatic.  He underwent repeat blood work in March 2021 by Dr. Sharlett Iles at [facility].  Total cholesterol was 109, LDL cholesterol 33, triglycerides 80 and HDL 60.  TSH was normal.  LFTs were normal.  He continues to be on atorvastatin 80 mg, Zetia 10 mg and Repatha 140 migrans per mL every 2 weeks.  He is not having angina on isosorbide 30 mg  in addition to metoprolol succinate 25 mg daily.  He also is on ramipril for blood pressure control.   Since I last saw him, he has continued to be stable from a cardiac standpoint.  He is keeping very busy in his retired life caring for his children and grandchildren.  He did receive 2 of vaccinations and also 1 booster.  He continues to tolerate atorvastatin 80 mg, Zetia 10 mg, and Repatha 140 mg every 2 weeks for his familial hyperlipidemia.  Lipid studies in March 2022 showed total cholesterol 117, triglycerides 75, HDL 66 and LDL 36.  He continues to be on isosorbide 30 mg daily in addition to metoprolol succinate 25 mg and ramipril 10 mg daily for hypertension and CAD.  He remains very active.  He has been traveling.  Recent PSA was 4.185 and he was advised by Dr. Sharlett Iles to undergo a urologic evaluation which has not yet been done.  He presents for yearly evaluation with me.  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   Hyperlipidemia    Primary localized osteoarthritis of right knee 06/09/2015    Past Surgical History:  Procedure Laterality Date   COLONOSCOPY     CORONARY  ANGIOPLASTY WITH STENT PLACEMENT  10/2000   stenting of the CX vessel   KNEE ARTHROSCOPY W/ MENISCECTOMY Left 07/15/2006   KNEE ARTHROSCOPY W/ MENISCECTOMY Right 02/09/2012   removal of skin cancer     right arm   TOTAL KNEE ARTHROPLASTY Right 06/09/2015   Procedure: RIGHT TOTAL KNEE ARTHROPLASTY;  Surgeon: Elsie Saas, MD;  Location: [facility];  Service: Orthopedics;  Laterality: Right;   US ECHOCARDIOGRAPHY  03/07/2012   Trace AI,mild MR,mild to mod TR    Allergies  Allergen Reactions   Influenza Vaccines     Guillain-Barr   Other     Seasonal allergies    Current Outpatient Medications  Medication Sig Dispense Refill   acetaminophen (TYLENOL) 325 MG tablet Take 2 tablets (650 mg total) by mouth every 6 (six) hours as needed for mild pain, fever or headache.     atorvastatin (LIPITOR) 80 MG tablet TAKE 1 TABLET BY MOUTH EVERY DAY 90 tablet 1   ezetimibe (ZETIA) 10 MG tablet TAKE 1 TABLET BY MOUTH EVERY DAY 90 tablet 3   fluticasone (FLONASE) 50 MCG/ACT nasal spray Place 1 spray into both nostrils as needed for allergies.      isosorbide mononitrate (IMDUR) 30 MG 24 hr tablet TAKE 1 TABLET BY MOUTH EVERY DAY 90 tablet 3   metoprolol succinate (TOPROL-XL) 25 MG 24 hr tablet Take 1 tablet (25 mg total) by mouth daily. 90 tablet 0   Multiple Vitamin (MULTIVITAMIN) tablet Take 1 tablet by mouth daily.     ramipril (ALTACE) 10 MG capsule TAKE 1 CAPSULE BY MOUTH EVERY DAY 90 capsule 3   REPATHA SURECLICK 884 MG/ML SOAJ ADMINISTER 1 ML UNDER THE SKIN EVERY 14 DAYS 2 mL 11   No current facility-administered medications for this visit.    Social History   Socioeconomic History   Marital status: Married    Spouse name: Not on file   Number of children: Not on file   Years of education: Not on file   Highest education level: Not on file  Occupational History   Not on file  Tobacco Use   Smoking  status: Never   Smokeless tobacco: Never  Vaping Use   Vaping Use: Never used  Substance  and Sexual Activity   Alcohol use: Yes    Alcohol/week: 10.0 standard drinks    Types: 10 Standard drinks or equivalent per week    Comment: wine 2 galsses each day   Drug use: No   Sexual activity: Not on file  Other Topics Concern   Not on file  Social History Narrative   Not on file   Social Determinants of Health   Financial Resource Strain: Not on file  Food Insecurity: Not on file  Transportation Needs: Not on file  Physical Activity: Not on file  Stress: Not on file  Social Connections: Not on file  Intimate Partner Violence: Not on file    Family History  Problem Relation Age of Onset   Heart disease Mother    Cancer Father        kidney    ROS General: Negative; No fevers, chills, or night sweats;  HEENT: Negative; No changes in vision or hearing, sinus congestion, difficulty swallowing Pulmonary: Negative; No cough, wheezing, shortness of breath, hemoptysis Cardiovascular: Negative; No chest pain, presyncope, syncope, palpitations GI: Negative; No nausea, vomiting, diarrhea, or abdominal pain GU: Negative; No dysuria, hematuria, or difficulty voiding Musculoskeletal: Negative; no myalgias, joint pain, or weakness Hematologic/Oncology: Negative; no easy bruising, bleeding Endocrine: Negative; no heat/cold intolerance; no diabetes Neuro: Negative; no changes in balance, headaches Skin:  History of melanoma resection Psychiatric: Negative; No behavioral problems, depression Sleep: Negative; No snoring, daytime sleepiness, hypersomnolence, bruxism, restless legs, hypnogognic hallucinations, no cataplexy Other comprehensive 14 point system review is negative.   PE BP 136/76   Pulse (!) 54   Ht 5' 10"  (1.778 m)   Wt 195 lb 3.2 oz (88.5 kg)   SpO2 98%   BMI 28.01 kg/m    Repeat blood pressure by me was 116/70  Wt Readings from Last 3 Encounters:  10/23/20 195 lb 3.2 oz (88.5 kg)  10/22/19 195 lb (88.5 kg)  06/05/19 200 lb 8 oz (90.9 kg)    General:  Alert, oriented, no distress.  Skin: normal turgor, no rashes, warm and dry HEENT: Normocephalic, atraumatic. Pupils equal round and reactive to light; sclera anicteric; extraocular muscles intact; Nose without nasal septal hypertrophy Mouth/Parynx benign; Mallinpatti scale 2 Neck: No JVD, no carotid bruits; normal carotid upstroke Lungs: clear to ausculatation and percussion; no wheezing or rales Chest wall: without tenderness to palpitation Heart: PMI not displaced, RRR, s1 s2 normal, 1/6 systolic murmur, no diastolic murmur, no rubs, gallops, thrills, or heaves Abdomen: soft, nontender; no hepatosplenomehaly, BS+; abdominal aorta nontender and not dilated by palpation. Back: no CVA tenderness Pulses 2+ Musculoskeletal: full range of motion, normal strength, no joint deformities Extremities: no clubbing cyanosis or edema, Homan's sign negative  Neurologic: grossly nonfocal; Cranial nerves grossly wnl Psychologic: Normal mood and affect  ECG (independently read by me): Sinus bradycardia 54 bpm with PAC.  June 2021 ECG (independently read by me): Sinus bradycardia 58 bpm.  Normal intervals.  No ectopy  March 2020 ECG (independently read by me): Sinus bradycardia 59 bpm.  Isolated PVC.  Normal intervals.  March 2018 ECG (independently read by me): sinus bradycardia 54 bpm.  Normal intervals.  No ST segment changes.  July 2017 ECG (independently read by me): Normal sinus rhythm at 78 bpm.  Normal intervals.  No significant ST segment changes.  February 2016 ECG (independently read by me): Sinus bradycardia 54  bpm.  Normal intervals.  No ST segment changes.  January 2015ECG (independently read by me): Normal sinus rhythm. No significant ST-T changes. PoorR wave progression.  LABS: BMP Latest Ref Rng & Units 02/13/2018 02/12/2018 02/11/2018  Glucose 70 - 99 mg/dL 95 120(H) 100(H)  BUN 8 - 23 mg/dL 32(H) 33(H) 47(H)  Creatinine 0.61 - 1.24 mg/dL 1.00 0.98 1.23  Sodium 135 - 145 mmol/L  134(L) 134(L) 133(L)  Potassium 3.5 - 5.1 mmol/L 4.6 4.3 4.8  Chloride 98 - 111 mmol/L 103 102 103  CO2 22 - 32 mmol/L 26 25 23   Calcium 8.9 - 10.3 mg/dL 9.9 9.7 9.5   Hepatic Function Latest Ref Rng & Units 02/10/2018 02/07/2018 02/03/2018  Total Protein 6.5 - 8.1 g/dL 8.7(H) - 6.9  Albumin 3.5 - 5.0 g/dL 3.4(L) 3.3(L) 4.0  AST 15 - 41 U/L 81(H) - 36  ALT 0 - 44 U/L 137(H) - 44  Alk Phosphatase 38 - 126 U/L 80 - 74  Total Bilirubin 0.3 - 1.2 mg/dL 1.1 - 0.6  Bilirubin, Direct 0.00 - 0.40 mg/dL - - -   CBC Latest Ref Rng & Units 02/10/2018 02/09/2018 02/07/2018  WBC 4.0 - 10.5 K/uL 5.4 8.4 5.9  Hemoglobin 13.0 - 17.0 g/dL 14.0 14.1 13.4  Hematocrit 39.0 - 52.0 % 42.4 42.8 41.7  Platelets 150 - 400 K/uL 227 298 238   Lab Results  Component Value Date   MCV 91.4 02/10/2018   MCV 91.5 02/09/2018   MCV 92.7 02/07/2018   Lab Results  Component Value Date   TSH 1.369 02/03/2018    Lab Results  Component Value Date   HGBA1C 6.2 (H) 02/03/2018   Lipid Panel     Component Value Date/Time   CHOL 156 03/29/2018 0847   TRIG 67 03/29/2018 0847   HDL 106 03/29/2018 0847   CHOLHDL 1.5 03/29/2018 0847   CHOLHDL 1.4 05/19/2016 0807   VLDL 13 05/19/2016 0807   LDLCALC 37 03/29/2018 0847   Blood work by Dr. Sharlett Iles of [place] from 07/09/2016 was reviewed Cholesterol 125, triglycerides 71, HDL 54, LDL 57.  I reviewed most recent laboratory from [place] collected on 03/12/2018.  Total cholesterol was 125, triglycerides 86, HDL 66, LDL 42.   Most recent laboratory from July 24, 2018 from [facility] were reviewed. [place] 111, triglycerides 70, HDL 56, LDL 41. Glucose 97.  BUN 26 creatinine 0.8.  LFTs normal. Hemoglobin 13.8 hematocrit 42.9. PSA had increased from 2.3-4.345 and he was given a prescription for Cipro pro 500 mg twice a day with plans to recheck a PSA in 3 weeks  IMPRESSION:  1. Essential hypertension   2. Familial  hyperlipidemia, high LDL   3. Coronary artery disease involving native coronary artery of native heart without angina pectoris   4. 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 Thos Matsumoto is a 75 year-old gentleman who has familial hyperlipidemia with initial cholesterols in excess of 500 and LDL cholesterols in excess of 400. Over the last 25 years, I have been aggressively managing his lipid studies.  In 2002 he was found to have a subtotally occluded obtuse marginal 1 stenosis after nuclear study suggested inferolateral ischemia. He underwent successful intervention with stenting of a subtotally occluded circumflex vessel. There was concomitant CAD, also involving his LAD and RCA.  A nuclear study done in 2016 for preoperative clearance prior to his knee surgery revealed continued normal perfusion  without scar or ischemia.  This was entirely normal. Based on the [place] diagnostic criteria he has familial hypercholesterolemia with a score of at least 13.  He has been on PCSK9 inhibition since 2016.  He has continued to take atorvastatin 80 mg and Zetia 10 mg.   Since starting PCSK9 inhibition therapy, his LDL cholesterols have consistently been under 50 and had been in the mid 20s on several occasions.  He  switched to Repatha due to cost continues to be on 140 mg every 2 weeks.  Laboratory done on August 03, 2019 at [facility] was outstanding with a total cholesterol 109, triglycerides 80, HDL 60 and LDL 33.  Also recent lipid studies were reviewed from July 24, 2020 which revealed stable laboratory with a LDL cholesterol at 36.  His blood pressure today continues to be well controlled on his regimen consisting of isosorbide 30 mg, metoprolol succinate 25 mg, and ramipril 10 mg.  He is not having anginal symptoms.  He showed me laboratory from one of his daughters who I know well who also has familial hyperlipidemia and prior to treatment and weight  loss her total cholesterol was 401and  LDL 311.  Studies have markedly improved with treatment.  Presently he is stable.  I have encouraged continued activity and exercise.  Will be undergoing urologic evaluation for PSA of 4.185.  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  As long as he remains stable I will see him in 1 year for follow-up evaluation.  Troy Sine, MD, [facility]  10/24/2020 3:50 PM

## 2020-10-23 NOTE — Patient Instructions (Signed)

## 2020-10-24 ENCOUNTER — Encounter: Payer: Self-pay | Admitting: Cardiovascular Disease

## 2020-12-14 ENCOUNTER — Other Ambulatory Visit: Payer: Self-pay | Admitting: Cardiovascular Disease

## 2021-03-16 ENCOUNTER — Other Ambulatory Visit: Payer: Self-pay | Admitting: Cardiovascular Disease

## 2021-04-20 ENCOUNTER — Other Ambulatory Visit: Payer: Self-pay | Admitting: Pharmacist Clinician (PhC)/ Clinical Pharmacy Specialist

## 2021-04-20 MED ORDER — REPATHA SURECLICK 140 MG/ML ~~LOC~~ SOAJ: 140.0000 mg | SUBCUTANEOUS | 4 refills | Status: DC

## 2021-07-12 ENCOUNTER — Other Ambulatory Visit: Payer: Self-pay | Admitting: Cardiovascular Disease

## 2021-09-11 ENCOUNTER — Other Ambulatory Visit: Payer: Self-pay | Admitting: Cardiovascular Disease

## 2021-10-15 ENCOUNTER — Other Ambulatory Visit: Payer: Self-pay | Admitting: Cardiovascular Disease

## 2021-10-29 ENCOUNTER — Encounter: Payer: Self-pay | Admitting: Cardiovascular Disease

## 2021-10-29 ENCOUNTER — Ambulatory Visit (INDEPENDENT_AMBULATORY_CARE_PROVIDER_SITE_OTHER): Payer: Medicare Other | Admitting: Cardiovascular Disease

## 2021-10-29 VITALS — BP 124/76 | HR 62 | Ht 71.0 in | Wt 195.2 lb

## 2021-10-29 DIAGNOSIS — E7849 Other hyperlipidemia: Secondary | ICD-10-CM

## 2021-10-29 DIAGNOSIS — Z8669 Personal history of other diseases of the nervous system and sense organs: Secondary | ICD-10-CM | POA: Diagnosis not present

## 2021-10-29 DIAGNOSIS — I2510 Atherosclerotic heart disease of native coronary artery without angina pectoris: Secondary | ICD-10-CM | POA: Diagnosis not present

## 2021-10-29 DIAGNOSIS — I10 Essential (primary) hypertension: Secondary | ICD-10-CM | POA: Diagnosis not present

## 2021-10-29 DIAGNOSIS — Z9229 Personal history of other drug therapy: Secondary | ICD-10-CM

## 2021-10-29 NOTE — Progress Notes (Signed)
Patient ID: Henry Morrison, male   DOB: Oct 11, 1945, 76 y.o.   MRN: [id number]    Primary M.D.: Henry Morrison  HPI: Henry Morrison is a 76 y.o. male who presents to the office for a 12 month cardiology evaluation.   Mr. 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  At that time, he had told me that he had first been told of having high cholesterol approximatey 8 years previously with cholesterols in excess of 400, but he was not on any therapy.  He had undergone a routine treadmill test which reportely was normal by Henry Morrison.  Of note, his mother had very high cholesterols throughout her life and died of a myocardial infarction at age 42. When the patient was initially referred to me, his laboratory was notable for a total cholesterol of 510 and LDL cholesterol of 422.  Since my initial evaluation, he was started on aspirin therapy as well as initial statin treatment with simvastatin..  As more aggressive statins became available, he ultimately was treated with atorvastatin 80 mg in addition to Zetia 10 mg. Of note a review of his medical record indicates that there was a short period of time when he was traveling excessively and had stopped taking therapy. Subsequent blood work in 1999 upon his return showed his cholesterol had risen back to 343 with an LDL cholesterol of 245.   A nuclear perfusion study in 2002 demonstrated new development of inferolateral ischemia. In June 2002 he underwent stenting of a subtotal occlusion of a circumflex vessel which time a 2.75x23 mm HepaCoat stent was inserted. 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 A nuclear perfusion study in October 2011 continued to show normal  perfusion without scar or ischemia.   He remains active. 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  He has been maintained on atorvastatin 80 mg and Zetia 10 mg for his marked hyperlipidemia. His blood pressure has been treated with metoprolol succinate 25 mg as well as altace 10 mg. He also is on low dose isosorbide mononitrate and has not been experiencing any recurrent anginal symptoms.  Blood work done on 06/17/2014:  total cholesterol 194, triglycerides 72, HDL 66, and LDL cholesterol is 114. Liver tests are normal. Renal function is normal with a creatinine of 0.8.  Fasting glucose was 94.  Prior to undergoing knee replacement surgery  he underwent a nuclear perfusion study on 08/06/2014.  This remained low risk and did not demonstrate scar or ischemia.  He had normal LV function.  He underwent his knee replacement in January 2017 and has done well from a cardiovascular standpoint.  After seeing me in 2016, I had a long discussion with the patient and felt he was a candidate for PCSK-9 inhibition therapy.  He has been on Henry Morrison when 75 mg every 14 days since the spring of 2016.  This has resulted in dramatic benefit and lab work on 12/24/2014 showed a total cholesterol of 150 with an LDL cholesterol of 43, and blood work on 04/14/2015 revealed a total cholesterol 141 and LDL cholesterol at 36.  HDL was 93, VLDL 12, and  triglycerides 59.  Subsequent laboratory continued to show improvement in LDL and in January 2018 LDL had reduced to 24.  He recently underwent subsequent blood work on 07/09/2016 which showed an LDL of 57.  He continues to be on atorvastatin 80 mg, Zetia 10 mg, and apparently is only on the reduced dose of Henry Morrison at 75 mg every 2 weeks.    I recommended he increase Henry Morrison to 150 mg since his LDL had increased into the 40s.  Subsequent blood work demonstrated significant improvement with LDL back down to 24.  I saw him in  March 2019 at which time he was now fully retired. He was staying active with his grandchildren and travels.  He denies recurrent chest pain, PND, orthopnea. He had just started on Medicare,  as result , as result, he now has to pay for PCSK9 whereas previously his commercial insurance paid 100%.  Because of this and the recently reduced cost of Henry Morrison, which would result in a lower Medicare co-pay cost he was switched to Henry Morrison. .  He has tolerated this well, but his co-pay is still over $100 per month.  He continues to be on concomitant therapy with atorvastatin 80 mg and Zetia 10 mg.  He is not having any anginal symptoms on isosorbide 30 mg and Toprol 25 mg.  He  underwent a three-year f/u nuclear perfusion study on 08/01/2017 which remained entirely normal.  He presents for reevaluation.  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  He initially noticed tingling in his feet day after the flu shot the following day collapsed while in the shower.  He was unable to ambulate.  While hospitalized he underwent a 5-day course of IVIG and lumbar puncture showed modest elevation in the spinal fluid protein.  He completed inpatient rehabilitation and ultimately has sniffily improved.  It took him 3 months to be able to button his shirt.  He still notes some occasional tingling of his feet.   I saw him in March 2020 which time he was remaining fairly active and denied any episodes of angina.  He continues to work in his yard and with her projects without discomfort or shortness of breath.  He keeps active with his grand children and great-grandchildren.  He is unaware of palpitations.  He is tolerating Henry Morrison well without side effects.  He continues to be on atorvastatin 80 mg and Zetia 10 mg.  His t lipid panel on July 24, 2018 at [facility] has shown an LDL cholesterol at 41.  This has risen slightly compared to 1  year ago when his LDL was 24 and on another lab where it was 87.  He had questioned whether or not his atorvastatin dose could be reduced.  He continues to be on ramipril 10 mg, Toprol-XL 25 mg, and isosorbide mononitrate 30 mg for his CAD and there is no angina on therapy.   I saw him in June 2021 and over the prior year he continued to be asymptomatic.  He underwent repeat blood work in March 2021 by Henry. Sharlett Iles at [facility].  Total cholesterol was 109, LDL cholesterol 33, triglycerides 80 and HDL 60.  TSH was normal.  LFTs were normal.  He continues to be on atorvastatin 80 mg, Zetia 10 mg and Henry Morrison 140 migrans per mL every 2 weeks.  He is not having angina on isosorbide 30 mg in addition  to metoprolol succinate 25 mg daily.  He also is on ramipril for blood pressure control.   I last saw him on October 23, 2020 and since his prior evaluation he continues to be stable.   He is keeping very busy in his retired life caring for his children and grandchildren.  He did receive 2 of vaccinations and also 1 booster.  He continues to tolerate atorvastatin 80 mg, Zetia 10 mg, and Henry Morrison 140 mg every 2 weeks for his familial hyperlipidemia.  Lipid studies in March 2022 showed total cholesterol 117, triglycerides 75, HDL 66 and LDL 36.  He continues to be on isosorbide 30 mg daily in addition to metoprolol succinate 25 mg and ramipril 10 mg daily for hypertension and CAD.  He remains very active.  He has been traveling.  Recent PSA was 4.185 and he was advised by Henry. Sharlett Iles to undergo a urologic evaluation which has not yet been done.    Since I last saw him, he feels well.  He now has been doing Optometrist work and has been having to travel to [place] there in several occasions.  He saw Henry Morrison in January 2023.  Lipid studies showed total cholesterol 159, triglycerides 89, HDL 63, and LDL had risen to 78.  He had normal renal function with a BUN of 20 creatinine 1.0.  LFTs were normal.   Hemoglobin hematocrit were stable.  He does travel extensively and will be taking several vacations with his family this year.  He denies any recurrent chest pain or shortness of breath.  He is unaware of palpitations.  He presents for yearly evaluation.  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   Hyperlipidemia    Primary localized osteoarthritis of right knee 06/09/2015    Past Surgical History:  Procedure Laterality Date   COLONOSCOPY     CORONARY ANGIOPLASTY WITH STENT PLACEMENT  10/2000   stenting of the CX vessel   KNEE ARTHROSCOPY W/ MENISCECTOMY Left 07/15/2006   KNEE ARTHROSCOPY W/ MENISCECTOMY Right 02/09/2012   removal of skin cancer     right arm   TOTAL KNEE ARTHROPLASTY Right 06/09/2015   Procedure: RIGHT TOTAL KNEE ARTHROPLASTY;  Surgeon: Elsie Saas, MD;  Location: [facility];  Service: Orthopedics;  Laterality: Right;   US ECHOCARDIOGRAPHY  03/07/2012   Trace AI,mild MR,mild to mod TR    Allergies  Allergen Reactions   Influenza Vaccines     Guillain-Barr   Other     Seasonal allergies    Current Outpatient Medications  Medication Sig Dispense Refill   atorvastatin (LIPITOR) 80 MG tablet TAKE ONE TABLET BY MOUTH ONCE DAILY. 90 tablet 1   Evolocumab (Henry Morrison SURECLICK) 268 MG/ML SOAJ Inject 140 mg into the skin every 14 (fourteen) days. 6 mL 4   ezetimibe (ZETIA) 10 MG tablet TAKE ONE TABLET BY MOUTH ONCE DAILY. 90 tablet 0   fluticasone (FLONASE) 50 MCG/ACT nasal spray Place 1 spray into both nostrils as needed for allergies.      isosorbide mononitrate (IMDUR) 30 MG 24 hr tablet TAKE ONE TABLET BY MOUTH ONCE DAILY. 90 tablet 0   metoprolol succinate (TOPROL-XL) 25 MG 24 hr tablet TAKE ONE TABLET BY MOUTH ONCE DAILY. 90 tablet 1   Multiple Vitamin (MULTIVITAMIN) tablet Take 1 tablet by mouth daily.     ramipril (  ALTACE) 10 MG capsule TAKE ONE  CAPSULE BY MOUTH ONCE DAILY. 90 capsule 1   acetaminophen (TYLENOL) 325 MG tablet Take 2 tablets (650 mg total) by mouth every 6 (six) hours as needed for mild pain, fever or headache.     No current facility-administered medications for this visit.    Social History   Socioeconomic History   Marital status: Married    Spouse name: Not on file   Number of children: Not on file   Years of education: Not on file   Highest education level: Not on file  Occupational History   Not on file  Tobacco Use   Smoking status: Never   Smokeless tobacco: Never  Vaping Use   Vaping Use: Never used  Substance and Sexual Activity   Alcohol use: Yes    Alcohol/week: 10.0 standard drinks of alcohol    Types: 10 Standard drinks or equivalent per week    Comment: wine 2 galsses each day   Drug use: No   Sexual activity: Not on file  Other Topics Concern   Not on file  Social History Narrative   Not on file   Social Determinants of Health   Financial Resource Strain: Not on file  Food Insecurity: Not on file  Transportation Needs: Not on file  Physical Activity: Not on file  Stress: Not on file  Social Connections: Not on file  Intimate Partner Violence: Not on file    Family History  Problem Relation Age of Onset   Heart disease Mother    Cancer Father        kidney    ROS General: Negative; No fevers, chills, or night sweats;  HEENT: Negative; No changes in vision or hearing, sinus congestion, difficulty swallowing Pulmonary: Negative; No cough, wheezing, shortness of breath, hemoptysis Cardiovascular: See HPI, history of familial hyperlipidemia GI: Negative; No nausea, vomiting, diarrhea, or abdominal pain GU: Negative; No dysuria, hematuria, or difficulty voiding Musculoskeletal: Negative; no myalgias, joint pain, or weakness Hematologic/Oncology: Negative; no easy bruising, bleeding Endocrine: Negative; no heat/cold intolerance; no diabetes Neuro: Negative; no changes in  balance, headaches Skin:  History of melanoma resection Psychiatric: Negative; No behavioral problems, depression Sleep: Negative; No snoring, daytime sleepiness, hypersomnolence, bruxism, restless legs, hypnogognic hallucinations, no cataplexy Other comprehensive 14 point system review is negative.   PE BP 124/76 (BP Location: Left Arm, Patient Position: Sitting, Cuff Size: Large)   Pulse 62   Ht 5' 11" (1.803 m)   Wt 195 lb 3.2 oz (88.5 kg)   SpO2 96%   BMI 27.22 kg/m    Repeat blood pressure was 134/76; states his blood pressure at home typically is in the 120s and 70s  Wt Readings from Last 3 Encounters:  10/29/21 195 lb 3.2 oz (88.5 kg)  10/23/20 195 lb 3.2 oz (88.5 kg)  10/22/19 195 lb (88.5 kg)   General: Alert, oriented, no distress.  Skin: normal turgor, no rashes, warm and dry HEENT: Normocephalic, atraumatic. Pupils equal round and reactive to light; sclera anicteric; extraocular muscles intact; Fundi ** Nose without nasal septal hypertrophy Mouth/Parynx benign; Mallinpatti scale 2 Neck: No JVD, no carotid bruits; normal carotid upstroke Lungs: clear to ausculatation and percussion; no wheezing or rales Chest wall: without tenderness to palpitation Heart: PMI not displaced, RRR, s1 s2 normal, 1/6 systolic murmur, no diastolic murmur, no rubs, gallops, thrills, or heaves Abdomen: soft, nontender; no hepatosplenomehaly, BS+; abdominal aorta nontender and not dilated by palpation. Back: no CVA tenderness Pulses 2+  Musculoskeletal: full range of motion, normal strength, no joint deformities Extremities: no clubbing cyanosis or edema, Homan's sign negative  Neurologic: grossly nonfocal; Cranial nerves grossly wnl Psychologic: Normal mood and affect   October 29, 2021 ECG (independently read by me): NSR at 62, no ectopy, normal intervals  October 23, 2020 ECG (independently read by me): Sinus bradycardia 54 bpm with PAC.  June 2021 ECG (independently read by me): Sinus  bradycardia 58 bpm.  Normal intervals.  No ectopy  March 2020 ECG (independently read by me): Sinus bradycardia 59 bpm.  Isolated PVC.  Normal intervals.  March 2018 ECG (independently read by me): sinus bradycardia 54 bpm.  Normal intervals.  No ST segment changes.  July 2017 ECG (independently read by me): Normal sinus rhythm at 78 bpm.  Normal intervals.  No significant ST segment changes.  February 2016 ECG (independently read by me): Sinus bradycardia 54 bpm.  Normal intervals.  No ST segment changes.  January 2015ECG (independently read by me): Normal sinus rhythm. No significant ST-T changes. PoorR wave progression.  LABS:    Latest Ref Rng & Units 02/13/2018    6:25 AM 02/12/2018    8:18 AM 02/11/2018    6:04 AM  BMP  Glucose 70 - 99 mg/dL 95  120  100   BUN 8 - 23 mg/dL 32  33  47   Creatinine 0.61 - 1.24 mg/dL 1.00  0.98  1.23   Sodium 135 - 145 mmol/L 134  134  133   Potassium 3.5 - 5.1 mmol/L 4.6  4.3  4.8   Chloride 98 - 111 mmol/L 103  102  103   CO2 22 - 32 mmol/L _0 Calcium 8.9 - 10.3 mg/dL 9.9  9.7  9.5       Latest Ref Rng & Units 02/10/2018    5:46 AM 02/07/2018    5:02 AM 02/03/2018   10:41 AM  Hepatic Function  Total Protein 6.5 - 8.1 g/dL 8.7   6.9   Albumin 3.5 - 5.0 g/dL 3.4  3.3  4.0   AST 15 - 41 U/L 81   36   ALT 0 - 44 U/L 137   44   Alk Phosphatase 38 - 126 U/L 80   74   Total Bilirubin 0.3 - 1.2 mg/dL 1.1   0.6       Latest Ref Rng & Units 02/10/2018    5:46 AM 02/09/2018    2:41 PM 02/07/2018    5:02 AM  CBC  WBC 4.0 - 10.5 K/uL 5.4  8.4  5.9   Hemoglobin 13.0 - 17.0 g/dL 14.0  14.1  13.4   Hematocrit 39.0 - 52.0 % 42.4  42.8  41.7   Platelets 150 - 400 K/uL 227  298  238    Lab Results  Component Value Date   MCV 91.4 02/10/2018   MCV 91.5 02/09/2018   MCV 92.7 02/07/2018   Lab Results  Component Value Date   TSH 1.369 02/03/2018    Lab Results  Component Value Date   HGBA1C 6.2 (H) 02/03/2018   Lipid Panel      Component Value Date/Time   CHOL 156 03/29/2018 0847   TRIG 67 03/29/2018 0847   HDL 106 03/29/2018 0847   CHOLHDL 1.5 03/29/2018 0847   CHOLHDL 1.4 05/19/2016 0807   VLDL 13 05/19/2016 0807   LDLCALC 37 03/29/2018 0847   Blood work by Henry. Sharlett Iles of [facility] from 07/09/2016 was reviewed Cholesterol 125, triglycerides 71, HDL 54, LDL 57.  I reviewed most recent laboratory from [facility] collected on 03/12/2018.  Total cholesterol was 125, triglycerides 86, HDL 66, LDL 42.   Most recent laboratory from July 24, 2018 from [facility] were reviewed. Henry Morrison 111, triglycerides 70, HDL 56, LDL 41. Glucose 97.  BUN 26 creatinine 0.8.  LFTs normal. Hemoglobin 13.8 hematocrit 42.9. PSA had increased from 2.3-4.345 and he was given a prescription for Cipro pro 500 mg twice a day with plans to recheck a PSA in 3 weeks  Lab work from Henry. Sharlett Iles from January 2023 was reviewed.  Total cholesterol 159, triglycerides 89, HDL 63, LDL 78.  LFTs were normal.  Renal function normal.  Hemoglobin hematocrit and platelets stable.  IMPRESSION:  1. Coronary artery disease involving native coronary artery of native heart without angina pectoris   2. Familial hyperlipidemia, high LDL   3. Essential hypertension   4. 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 Easter Schinke is a 76 year-old gentleman who has familial hyperlipidemia with initial cholesterols in excess of 500 and LDL cholesterols in excess of 400. Over the last 30 years, I have been aggressively managing his lipid studies.  In 2002 he was found to have a subtotally occluded obtuse marginal 1 stenosis after nuclear study suggested inferolateral ischemia. He underwent successful intervention with stenting of a subtotally occluded circumflex vessel. There was concomitant CAD, also involving his LAD and RCA.  A nuclear study done in 2016 for preoperative  clearance prior to his knee surgery revealed continued normal perfusion without scar or ischemia.  Based on the [place] diagnostic criteria he has familial hypercholesterolemia with a score of at least 13.  He has been on PCSK9 inhibition since 2016.  He has continued to take atorvastatin 80 mg and Zetia 10 mg.   Since starting PCSK9 inhibition therapy, his LDL cholesterols had consistently been under 50 and had been in the mid 20s on several occasions.  He  switched to Henry Morrison due to cost continues to be on 140 mg every 2 weeks.  Laboratory done on August 03, 2019 at [facility] was outstanding with a total cholesterol 109, triglycerides 80, HDL 60 and LDL 33 and lipid studies from July 24, 2020  revealed stable laboratory with a LDL cholesterol at 36.  He has had stable blood pressure and his blood pressure today initially was 124/76 on his regimen of ramipril 10 mg, metoprolol 25 mg daily and he continues to be on isosorbide 30 mg.  He denies any anginal symptomatology.  His last nuclear perfusion study was in March 2019 which remained entirely normal.  He had question whether or not he should have another nuclear perfusion study.  Clinically he is stable.  However I have suggested that next March in 2024 he had a 5-year follow-up evaluation.  I reviewed the recent laboratory from [place] from January 2023.  On this laboratory his LDL cholesterol had risen to 78.  He admits that he had been traveling fairly extensively and his diet may not been as optimal as previously.  I have suggested in August he have a follow-up fasting lipid panel and will also check an LP(a) and comprehensive metabolic panel.  He continues to do active doing yard work.  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  I will schedule him for  an exercise Myoview study in March 2024 and will see him in follow-up for further evaluation after his stress test   Troy Sine, MD,  [facility]  10/30/2021 6:25 PM

## 2021-10-29 NOTE — Patient Instructions (Signed)
Medication Instructions:  The current medical regimen is effective;  continue present plan and medications.  *If you need a refill on your cardiac medications before your next appointment, please call your pharmacy*   Lab Work: In August come back fasting for: LIPID, LPa, CMET- no lab appointment needed.   If you have labs (blood work) drawn today and your tests are completely normal, you will receive your results only by: [facility] [place] (if you have MyChart) OR A paper copy in the mail If you have any lab test that is abnormal or we need to change your treatment, we will call you to review the results.   Testing/Procedures: Your physician has requested that you have an Exercise Myoview.(MARCH 2024) A cardiac stress test is a cardiological test that measures the heart's ability to respond to external stress in a controlled clinical environment. The stress response is induced by exercise (exercise-treadmill). For further information please visit [email or web address]. If you have questions or concerns about your appointment, you can call the Nuclear Lab at [phone].    Follow-Up: At [facility], you and your health needs are our priority.  As part of our continuing mission to provide you with exceptional heart care, we have created designated Provider Care Teams.  These Care Teams include your primary Cardiologist (physician) and Advanced Practice Providers (APPs -  Physician Assistants and Nurse Practitioners) who all work together to provide you with the care you need, when you need it.  We recommend signing up for the patient portal called "MyChart".  Sign up information is provided on this After Visit Summary.  MyChart is used to connect with patients for Virtual Visits (Telemedicine).  Patients are able to view lab/test results, encounter notes, upcoming appointments, etc.  Non-urgent messages can be sent to your provider as well.   To learn more about what you can do with MyChart,  go to [email or web address].    Your next appointment:   March-April 2024 after stress test.  The format for your next appointment:   In Person  Provider:   Shelva Majestic, MD

## 2021-10-30 ENCOUNTER — Encounter: Payer: Self-pay | Admitting: Cardiovascular Disease

## 2021-12-09 ENCOUNTER — Other Ambulatory Visit: Payer: Self-pay | Admitting: Cardiovascular Disease

## 2022-01-11 ENCOUNTER — Other Ambulatory Visit: Payer: Self-pay | Admitting: Cardiovascular Disease

## 2022-01-12 LAB — COMPREHENSIVE METABOLIC PANEL
ALT: 29 IU/L (ref 0–44)
AST: 25 IU/L (ref 0–40)
Albumin/Globulin Ratio: 1.8 (ref 1.2–2.2)
Albumin: 4.5 g/dL (ref 3.8–4.8)
Alkaline Phosphatase: 77 IU/L (ref 44–121)
BUN/Creatinine Ratio: 28 — ABNORMAL HIGH (ref 10–24)
BUN: 24 mg/dL (ref 8–27)
Bilirubin Total: 0.7 mg/dL (ref 0.0–1.2)
CO2: 23 mmol/L (ref 20–29)
Calcium: 10.6 mg/dL — ABNORMAL HIGH (ref 8.6–10.2)
Chloride: 104 mmol/L (ref 96–106)
Creatinine, Ser: 0.87 mg/dL (ref 0.76–1.27)
Globulin, Total: 2.5 g/dL (ref 1.5–4.5)
Glucose: 97 mg/dL (ref 70–99)
Potassium: 5.5 mmol/L — ABNORMAL HIGH (ref 3.5–5.2)
Sodium: 140 mmol/L (ref 134–144)
Total Protein: 7 g/dL (ref 6.0–8.5)
eGFR: 90 mL/min/{1.73_m2} (ref >59)

## 2022-01-12 LAB — LIPID PANEL
Chol/HDL Ratio: 1.7 ratio (ref 0.0–5.0)
Cholesterol, Total: 129 mg/dL (ref 100–199)
HDL: 76 mg/dL (ref >39)
LDL Chol Calc (NIH): 39 mg/dL (ref 0–99)
Triglycerides: 70 mg/dL (ref 0–149)
VLDL Cholesterol Cal: 14 mg/dL (ref 5–40)

## 2022-01-12 LAB — LIPOPROTEIN A (LPA): Lipoprotein (a): 72.5 nmol/L (ref <75.0)

## 2022-01-14 ENCOUNTER — Other Ambulatory Visit: Payer: Self-pay

## 2022-01-14 DIAGNOSIS — E7849 Other hyperlipidemia: Secondary | ICD-10-CM

## 2022-01-26 ENCOUNTER — Other Ambulatory Visit: Payer: Self-pay

## 2022-01-26 DIAGNOSIS — E7849 Other hyperlipidemia: Secondary | ICD-10-CM

## 2022-01-28 LAB — BASIC METABOLIC PANEL
BUN/Creatinine Ratio: 24 (ref 10–24)
BUN: 21 mg/dL (ref 8–27)
CO2: 23 mmol/L (ref 20–29)
Calcium: 9.9 mg/dL (ref 8.6–10.2)
Chloride: 106 mmol/L (ref 96–106)
Creatinine, Ser: 0.87 mg/dL (ref 0.76–1.27)
Glucose: 101 mg/dL — ABNORMAL HIGH (ref 70–99)
Potassium: 5.1 mmol/L (ref 3.5–5.2)
Sodium: 142 mmol/L (ref 134–144)
eGFR: 90 mL/min/{1.73_m2} (ref >59)

## 2022-01-28 LAB — PTH, INTACT AND CALCIUM: PTH: 20 pg/mL (ref 15–65)

## 2022-03-14 ENCOUNTER — Other Ambulatory Visit: Payer: Self-pay | Admitting: Cardiovascular Disease

## 2022-04-12 ENCOUNTER — Other Ambulatory Visit: Payer: Self-pay | Admitting: Cardiovascular Disease

## 2022-04-30 ENCOUNTER — Telehealth: Payer: Self-pay | Admitting: Pharmacist Clinician (PhC)/ Clinical Pharmacy Specialist

## 2022-04-30 NOTE — Telephone Encounter (Signed)
Repatha PA sent to [facility] 04/30/22  Aproved to 05/17/2023  Key: [id number]

## 2022-06-15 ENCOUNTER — Other Ambulatory Visit: Payer: Self-pay

## 2022-06-15 DIAGNOSIS — I2510 Atherosclerotic heart disease of native coronary artery without angina pectoris: Secondary | ICD-10-CM

## 2022-06-18 ENCOUNTER — Other Ambulatory Visit: Payer: Self-pay

## 2022-07-12 ENCOUNTER — Other Ambulatory Visit: Payer: Self-pay | Admitting: Cardiovascular Disease

## 2022-07-16 ENCOUNTER — Ambulatory Visit (HOSPITAL_COMMUNITY): Payer: Medicare Other | Attending: Cardiovascular Disease

## 2022-07-16 ENCOUNTER — Telehealth: Payer: Self-pay | Admitting: Cardiovascular Disease

## 2022-07-16 DIAGNOSIS — I2510 Atherosclerotic heart disease of native coronary artery without angina pectoris: Secondary | ICD-10-CM | POA: Diagnosis present

## 2022-07-16 LAB — MYOCARDIAL PERFUSION IMAGING
LV dias vol: 87 mL (ref 62–150)
LV sys vol: 34 mL
Nuc Stress EF: 61 %
Peak HR: 76 {beats}/min
Rest HR: 51 {beats}/min
Rest Nuclear Isotope Dose: 10.6 mCi
SDS: 1
SRS: 2
SSS: 3
ST Depression (mm): 0 mm
Stress Nuclear Isotope Dose: 31.1 mCi
TID: 0.93

## 2022-07-16 MED ORDER — REGADENOSON 0.4 MG/5ML IV SOLN
0.4000 mg | Freq: Once | INTRAVENOUS | Status: AC
  Administered 2022-07-16: 0.4 mg via INTRAVENOUS

## 2022-07-16 MED ORDER — TECHNETIUM TC 99M TETROFOSMIN IV KIT
10.6000 | PACK | Freq: Once | INTRAVENOUS | Status: AC | PRN
  Administered 2022-07-16: 10.6 via INTRAVENOUS

## 2022-07-16 MED ORDER — TECHNETIUM TC 99M TETROFOSMIN IV KIT
31.1000 | PACK | Freq: Once | INTRAVENOUS | Status: AC | PRN
  Administered 2022-07-16: 31.1 via INTRAVENOUS

## 2022-07-16 NOTE — Telephone Encounter (Signed)
Called patient, advised that results were completed today- MD has not yet reviewed, they came to his mychart and he was not sure of the response. I advised MD would review, and we would call / send via mychart to discuss any recommendations.   Patient verbalized understanding

## 2022-07-16 NOTE — Telephone Encounter (Signed)
Pt is calling to get results of stress test. Requesting return call.

## 2022-07-20 ENCOUNTER — Other Ambulatory Visit: Payer: Self-pay | Admitting: Cardiovascular Disease

## 2022-12-08 ENCOUNTER — Ambulatory Visit: Payer: Medicare Other | Admitting: Cardiovascular Disease

## 2023-01-12 NOTE — Progress Notes (Signed)
Cardiology Clinic Note   Patient Name: Henry Morrison Date of Encounter: 01/14/2023  Primary Care Provider:  Garlan Fillers, MD Primary Cardiologist:  Nicki Guadalajara, MD  Patient Profile    77 year old male with history of coronary artery disease with intervention with stenting to the obtuse marginal in 2002, familial hyperlipidemia, hypertension, and Katheran Awe after receiving flu vaccine.  When last seen by Dr. Tresa Endo is a nuclear medicine stress test was ordered.  This was completed on 07/16/2022 revealing low risk study with no evidence of ischemia.  He has been on PCSK9 inhibition since 2016, along with atorvastatin and Zetia.  Past Medical History    Past Medical History:  Diagnosis Date   Arthritis    CAD (coronary artery disease)    Cancer (HCC)    skin cancer    History of Guillain-Barre syndrome 06/05/2019   Hyperlipidemia    Primary localized osteoarthritis of right knee 06/09/2015   Past Surgical History:  Procedure Laterality Date   COLONOSCOPY     CORONARY ANGIOPLASTY WITH STENT PLACEMENT  10/2000   stenting of the CX vessel   KNEE ARTHROSCOPY W/ MENISCECTOMY Left 07/15/2006   KNEE ARTHROSCOPY W/ MENISCECTOMY Right 02/09/2012   removal of skin cancer     right arm   TOTAL KNEE ARTHROPLASTY Right 06/09/2015   Procedure: RIGHT TOTAL KNEE ARTHROPLASTY;  Surgeon: Salvatore Marvel, MD;  Location: [facility] OR;  Service: Orthopedics;  Laterality: Right;   US ECHOCARDIOGRAPHY  03/07/2012   Trace AI,mild MR,mild to mod TR    Allergies  Allergies  Allergen Reactions   Influenza Vaccines     Guillain-Barr   Other     Seasonal allergies    History of Present Illness    Henry Morrison returns today for ongoing assessment and management of coronary artery disease, hyperlipidemia, and hypertension.  Last seen 1 year ago by Dr. Tresa Endo on 10/29/2021 stable from a cardiac standpoint and continued on secondary prevention and lipid management.  He is without complaints today.  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  He plays golf, remains active, denies any chest pain shortness of breath or dizziness associated with any of his activities.  He is medically compliant.  There is a possibility that he will need to have a left knee replacement although this has not been scheduled or planned.  He is going to follow-up with his orthopedic surgeon sometime in the late fall to discuss.  Home Medications    Current Outpatient Medications  Medication Sig Dispense Refill   ezetimibe (ZETIA) 10 MG tablet TAKE ONE TABLET BY MOUTH ONCE DAILY. 90 tablet 3   fluticasone (FLONASE) 50 MCG/ACT nasal spray Place 1 spray into both nostrils as needed for allergies.      Multiple Vitamin (MULTIVITAMIN) tablet Take 1 tablet by mouth daily.     atorvastatin (LIPITOR) 80 MG tablet Take 1 tablet (80 mg total) by mouth daily. 90 tablet 3   Evolocumab (REPATHA SURECLICK) 140 MG/ML SOAJ Inject 140 mg into the skin every 14 (fourteen) days. 6 mL 3   isosorbide mononitrate (IMDUR) 30 MG 24 hr tablet Take 1 tablet (30 mg total) by mouth daily. 90 tablet 3   metoprolol succinate (TOPROL-XL) 25 MG 24 hr tablet Take 1 tablet (25 mg total) by mouth daily. 90 tablet 2   ramipril (ALTACE) 10 MG capsule Take 1 capsule (10 mg total) by  mouth daily. 90 capsule 3   No current facility-administered medications for this visit.     Family History    Family History  Problem Relation Age of Onset   Heart disease Mother    Cancer Father        kidney   He indicated that his mother is deceased. He indicated that his father is deceased.  Social History    Social History   Socioeconomic History   Marital status: Married    Spouse name: Not on file   Number of children: Not on file   Years of education: Not on file   Highest education level: Not on file  Occupational History   Not on file  Tobacco Use   Smoking  status: Never   Smokeless tobacco: Never  Vaping Use   Vaping status: Never Used  Substance and Sexual Activity   Alcohol use: Yes    Alcohol/week: 10.0 standard drinks of alcohol    Types: 10 Standard drinks or equivalent per week    Comment: wine 2 galsses each day   Drug use: No   Sexual activity: Not on file  Other Topics Concern   Not on file  Social History Narrative   Not on file   Social Determinants of Health   Financial Resource Strain: Not on file  Food Insecurity: Not on file  Transportation Needs: Not on file  Physical Activity: Not on file  Stress: Not on file  Social Connections: Not on file  Intimate Partner Violence: Not on file     Review of Systems    General:  No chills, fever, night sweats or weight changes.  Cardiovascular:  No chest pain, dyspnea on exertion, edema, orthopnea, palpitations, paroxysmal nocturnal dyspnea. Dermatological: No rash, lesions/masses Respiratory: No cough, dyspnea Urologic: No hematuria, dysuria Abdominal:   No nausea, vomiting, diarrhea, bright red blood per rectum, melena, or hematemesis Neurologic:  No visual changes, wkns, changes in mental status. All other systems reviewed and are otherwise negative except as noted above.  EKG Interpretation Date/Time:  Friday January 14 2023 09:16:59 EDT Ventricular Rate:  65 PR Interval:  174 QRS Duration:  84 QT Interval:  400 QTC Calculation: 416 R Axis:   -23  Text Interpretation: Sinus rhythm with Premature supraventricular complexes When compared with ECG of 31-Jan-2018 17:25, Premature supraventricular complexes are now Present Confirmed by Joni Reining [phone]) on 01/14/2023 12:13:46 PM    Physical Exam    VS:  BP 130/80 (BP Location: Left Arm, Patient Position: Sitting, Cuff Size: Large)   Pulse 65   Ht 5\' 11"  (1.803 m)   Wt 198 lb 12.8 oz (90.2 kg)   SpO2 97%   BMI 27.73 kg/m  , BMI Body mass index is 27.73 kg/m.     GEN: Well nourished, well developed, in  no acute distress. HEENT: normal. Neck: Supple, no JVD, carotid bruits, or masses. Cardiac: RRR, murmurs, rubs, or gallops. No clubbing, cyanosis, edema.  Radials/DP/PT 2+ and equal bilaterally.  Respiratory:  Respirations regular and unlabored, clear to auscultation bilaterally. GI: Soft, nontender, nondistended, BS + x 4. MS: no deformity or atrophy. Skin: warm and dry, no rash. Neuro:  Strength and sensation are intact. Psych: Normal affect.  EKG Interpretation Date/Time:  Friday January 14 2023 09:16:59 EDT Ventricular Rate:  65 PR Interval:  174 QRS Duration:  84 QT Interval:  400 QTC Calculation: 416 R Axis:   -23  Text Interpretation: Sinus rhythm with Premature supraventricular complexes When compared  with ECG of 31-Jan-2018 17:25, Premature supraventricular complexes are now Present Confirmed by Joni Reining [phone]) on 01/14/2023 12:13:46 PM   Lab Results  Component Value Date   WBC 5.4 02/10/2018   HGB 14.0 02/10/2018   HCT 42.4 02/10/2018   MCV 91.4 02/10/2018   PLT 227 02/10/2018   Lab Results  Component Value Date   CREATININE 0.87 01/26/2022   BUN 21 01/26/2022   NA 142 01/26/2022   K 5.1 01/26/2022   CL 106 01/26/2022   CO2 23 01/26/2022   Lab Results  Component Value Date   ALT 29 01/11/2022   AST 25 01/11/2022   ALKPHOS 77 01/11/2022   BILITOT 0.7 01/11/2022   Lab Results  Component Value Date   CHOL 129 01/11/2022   HDL 76 01/11/2022   LDLCALC 39 01/11/2022   TRIG 70 01/11/2022   CHOLHDL 1.7 01/11/2022    Lab Results  Component Value Date   HGBA1C 6.2 (H) 02/03/2018     Review of Prior Studies EKG Interpretation Date/Time:  Friday January 14 2023 09:16:59 EDT Ventricular Rate:  65 PR Interval:  174 QRS Duration:  84 QT Interval:  400 QTC Calculation: 416 R Axis:   -23  Text Interpretation: Sinus rhythm with Premature supraventricular complexes When compared with ECG of 31-Jan-2018 17:25, Premature supraventricular complexes are  now Present Confirmed by Joni Reining [phone]) on 01/14/2023 12:13:46 PM NM Stress Test 07/16/2022 Findings are consistent with no ischemia. The study is low risk.   No ST deviation was noted.   Left ventricular function is normal. End diastolic cavity size is normal. End systolic cavity size is normal.   Prior study available for comparison from 07/27/2017. No changes compared to prior study. No ischemia. LVEF 64%   No reversible ischemia. LVEF 61% with normal wall motion. This is a low risk study. Compared to a prior study in 2019, there has been no change.   Assessment & Plan   1.  Coronary artery disease: Most recent nuclear medicine stress test was low risk with no evidence of reversible ischemia.  He is asked me to review this test result with him and I have done so.  Questions have been answered.  Continue secondary management with blood pressure control, lipid management, weight management, and purposeful exercise.  He appears to be doing all 3 continue to be very active playing golf, and traveling.  No changes to his medication regimen.  Will follow-up with Dr. Tresa Endo in 6 months.  2.  Hypercholesterolemia: Goal of LDL less than 70.  The patient will continue on Lipitor 80 mg daily, Zetia, and Repatha, labs are followed by PCP.  He was at goal on last labs.  3.  Hypertension: Excellent control of blood pressure today.  Continue healthy lifestyle low-sodium diet.  No changes in regimen.         Signed, Bettey Mare. Liborio Nixon, ANP, AACC   01/14/2023 12:13 PM      Office [phone] Fax [phone]  Notice: This dictation was prepared with Dragon dictation along with smaller phrase technology. Any transcriptional errors that result from this process are unintentional and may not be corrected upon review.

## 2023-01-14 ENCOUNTER — Encounter: Payer: Self-pay | Admitting: Adult Health

## 2023-01-14 ENCOUNTER — Ambulatory Visit: Payer: Medicare Other | Admitting: Adult Health

## 2023-01-14 VITALS — BP 130/80 | HR 65 | Ht 71.0 in | Wt 198.8 lb

## 2023-01-14 DIAGNOSIS — E7800 Pure hypercholesterolemia, unspecified: Secondary | ICD-10-CM

## 2023-01-14 DIAGNOSIS — I10 Essential (primary) hypertension: Secondary | ICD-10-CM

## 2023-01-14 DIAGNOSIS — I2510 Atherosclerotic heart disease of native coronary artery without angina pectoris: Secondary | ICD-10-CM

## 2023-01-14 MED ORDER — METOPROLOL SUCCINATE ER 25 MG PO TB24: 25.0000 mg | ORAL_TABLET | Freq: Every day | ORAL | 2 refills | Status: DC

## 2023-01-14 MED ORDER — REPATHA SURECLICK 140 MG/ML ~~LOC~~ SOAJ: 140.0000 mg | SUBCUTANEOUS | 3 refills | Status: DC

## 2023-01-14 MED ORDER — ISOSORBIDE MONONITRATE ER 30 MG PO TB24: 30.0000 mg | ORAL_TABLET | Freq: Every day | ORAL | 3 refills | Status: DC

## 2023-01-14 MED ORDER — RAMIPRIL 10 MG PO CAPS: 10.0000 mg | ORAL_CAPSULE | Freq: Every day | ORAL | 3 refills | Status: AC

## 2023-01-14 MED ORDER — ATORVASTATIN CALCIUM 80 MG PO TABS: 80.0000 mg | ORAL_TABLET | Freq: Every day | ORAL | 3 refills | Status: DC

## 2023-01-14 NOTE — Patient Instructions (Signed)
 Medication Instructions:  No Changes *If you need a refill on your cardiac medications before your next appointment, please call your pharmacy*   Lab Work: No Labs If you have labs (blood work) drawn today and your tests are completely normal, you will receive your results only by: MyChart Message (if you have MyChart) OR A paper copy in the mail If you have any lab test that is abnormal or we need to change your treatment, we will call you to review the results.   Testing/Procedures: No Testing   Follow-Up: At [facility], you and your health needs are our priority.  As part of our continuing mission to provide you with exceptional heart care, we have created designated Provider Care Teams.  These Care Teams include your primary Cardiologist (physician) and Advanced Practice Providers (APPs -  Physician Assistants and Nurse Practitioners) who all work together to provide you with the care you need, when you need it.  We recommend signing up for the patient portal called "MyChart".  Sign up information is provided on this After Visit Summary.  MyChart is used to connect with patients for Virtual Visits (Telemedicine).  Patients are able to view lab/test results, encounter notes, upcoming appointments, etc.  Non-urgent messages can be sent to your provider as well.   To learn more about what you can do with MyChart, go to [email or web address].    Your next appointment:   6 month(s)  Provider:   Nicki Guadalajara, MD

## 2023-01-20 ENCOUNTER — Other Ambulatory Visit: Payer: Self-pay | Admitting: Cardiovascular Disease

## 2023-01-21 ENCOUNTER — Telehealth: Payer: Self-pay | Admitting: Cardiovascular Disease

## 2023-01-21 NOTE — Telephone Encounter (Signed)
Patient called and saw some concerning words on his EKG and would like for someone to go over it with him

## 2023-01-21 NOTE — Telephone Encounter (Signed)
Returned call to pt. He would like to speak to Dr. Landry Dyke nurse regarding his EKG. Pt states he does not understand some of the verbage. Advised pt that his heart is in normal rhythm and the other is an early beat. He would still like to talk to Anitra.

## 2023-01-31 ENCOUNTER — Telehealth: Payer: Self-pay | Admitting: Cardiovascular Disease

## 2023-01-31 DIAGNOSIS — I2510 Atherosclerotic heart disease of native coronary artery without angina pectoris: Secondary | ICD-10-CM

## 2023-01-31 DIAGNOSIS — E7800 Pure hypercholesterolemia, unspecified: Secondary | ICD-10-CM

## 2023-01-31 DIAGNOSIS — I10 Essential (primary) hypertension: Secondary | ICD-10-CM

## 2023-01-31 NOTE — Telephone Encounter (Signed)
Patient wants to know if he will need lab orders prior to his visit on 3/10.

## 2023-01-31 NOTE — Telephone Encounter (Signed)
I reviewed his ECG which showed normal sinus rhythm.  He had 1 isolated atrial premature beat.

## 2023-02-01 NOTE — Telephone Encounter (Signed)
Spoke to patient he will have fasting cmet,lipid panel,cbc,tsh done 1 week before his March appointment.Lab orders mailed.

## 2023-02-04 NOTE — Telephone Encounter (Signed)
Called pt to inform him of Dr. Landry Dyke assurance of normal EKG. Left message for cb.

## 2023-03-22 ENCOUNTER — Telehealth: Payer: Self-pay | Admitting: *Deleted

## 2023-03-22 NOTE — Telephone Encounter (Signed)
Patient was driving as I called to scheduled preop televisit. Patient states that he will call back tomorrow to schedule.

## 2023-03-22 NOTE — Telephone Encounter (Signed)
   Pre-operative Risk Assessment    Patient Name: Henry Morrison  DOB: 24-May-1945 MRN: [id number]      Request for Surgical Clearance    Procedure:   Left Total Knee Arthroplasty  Date of Surgery:  Clearance TBD                                 Surgeon:  Dr. Marcene Corning Surgeon's Group or Practice Name:  [facility] Phone number:  [phone] Fax number:  [phone]   Type of Clearance Requested:   - Medical    Type of Anesthesia:  Spinal   Additional requests/questions:    Last OV: 01/14/2023 Upcoming OV: Dr. Tresa Endo 07/25/2023  Signed, Emmit Pomfret   03/22/2023, 7:33 AM

## 2023-03-22 NOTE — Telephone Encounter (Signed)
   Name: Henry Morrison  DOB: 03-06-1946  MRN: [id number]  Primary Cardiologist: Nicki Guadalajara, MD   Preoperative team, please contact this patient and set up a phone call appointment for further preoperative risk assessment. Please obtain consent and complete medication review. Thank you for your help.  I confirm that guidance regarding antiplatelet and oral anticoagulation therapy has been completed and, if necessary, noted below.  None requested.   I also confirmed the patient resides in the state of [place]. As per [facility] Medical Board telemedicine laws, the patient must reside in the state in which the provider is licensed.   Carlos Levering, NP 03/22/2023, 4:23 PM [facility]

## 2023-03-23 ENCOUNTER — Telehealth: Payer: Self-pay | Admitting: Cardiovascular Disease

## 2023-03-23 ENCOUNTER — Telehealth: Payer: Self-pay | Admitting: *Deleted

## 2023-03-23 NOTE — Telephone Encounter (Signed)
Pt has been scheduled for tele pre op appt 03/31/23. Med rec and consent are done.      Patient Consent for Virtual Visit        Henry Morrison has provided verbal consent on 03/23/2023 for a virtual visit (video or telephone).   CONSENT FOR VIRTUAL VISIT FOR:  Henry Morrison  By participating in this virtual visit I agree to the following:  I hereby voluntarily request, consent and authorize [facility] and its employed or contracted physicians, physician assistants, nurse practitioners or other licensed health care professionals (the Practitioner), to provide me with telemedicine health care services (the "Services") as deemed necessary by the treating Practitioner. I acknowledge and consent to receive the Services by the Practitioner via telemedicine. I understand that the telemedicine visit will involve communicating with the Practitioner through live audiovisual communication technology and the disclosure of certain medical information by electronic transmission. I acknowledge that I have been given the opportunity to request an in-person assessment or other available alternative prior to the telemedicine visit and am voluntarily participating in the telemedicine visit.  I understand that I have the right to withhold or withdraw my consent to the use of telemedicine in the course of my care at any time, without affecting my right to future care or treatment, and that the Practitioner or I may terminate the telemedicine visit at any time. I understand that I have the right to inspect all information obtained and/or recorded in the course of the telemedicine visit and may receive copies of available information for a reasonable fee.  I understand that some of the potential risks of receiving the Services via telemedicine include:  Delay or interruption in medical evaluation due to technological equipment failure or disruption; Information transmitted may not be sufficient (e.g. poor  resolution of images) to allow for appropriate medical decision making by the Practitioner; and/or  In rare instances, security protocols could fail, causing a breach of personal health information.  Furthermore, I acknowledge that it is my responsibility to provide information about my medical history, conditions and care that is complete and accurate to the best of my ability. I acknowledge that Practitioner's advice, recommendations, and/or decision may be based on factors not within their control, such as incomplete or inaccurate data provided by me or distortions of diagnostic images or specimens that may result from electronic transmissions. I understand that the practice of medicine is not an exact science and that Practitioner makes no warranties or guarantees regarding treatment outcomes. I acknowledge that a copy of this consent can be made available to me via my patient portal [facility] MyChart), or I can request a printed copy by calling the office of [facility].    I understand that my insurance will be billed for this visit.   I have read or had this consent read to me. I understand the contents of this consent, which adequately explains the benefits and risks of the Services being provided via telemedicine.  I have been provided ample opportunity to ask questions regarding this consent and the Services and have had my questions answered to my satisfaction. I give my informed consent for the services to be provided through the use of telemedicine in my medical care

## 2023-03-23 NOTE — Telephone Encounter (Signed)
Henry Morrison, Henry Morrison A1 hour ago (1:09 PM)   RB Pt returning nurses phone call from yesterday 11/5. Please advise       Note   [name], [name] [phone]  Emilie Rutter

## 2023-03-23 NOTE — Telephone Encounter (Signed)
Pt returning nurses phone call from yesterday 11/5. Please advise

## 2023-03-23 NOTE — Telephone Encounter (Signed)
Pt has been scheduled for tele pre op appt 03/31/23. Med rec and consent are done.

## 2023-03-31 ENCOUNTER — Ambulatory Visit: Payer: Medicare Other | Attending: Cardiology | Admitting: Nurse Practitioner

## 2023-03-31 DIAGNOSIS — Z01810 Encounter for preprocedural cardiovascular examination: Secondary | ICD-10-CM | POA: Diagnosis not present

## 2023-03-31 NOTE — Progress Notes (Signed)
Virtual Visit via Telephone Note   Because of Henry Morrison's co-morbid illnesses, he is at least at moderate risk for complications without adequate follow up.  This format is felt to be most appropriate for this patient at this time.  The patient did not have access to video technology/had technical difficulties with video requiring transitioning to audio format only (telephone).  All issues noted in this document were discussed and addressed.  No physical exam could be performed with this format.  Please refer to the patient's chart for his consent to telehealth for [facility].  Evaluation Performed:  Preoperative cardiovascular risk assessment _____________   Date:  03/31/2023   Patient ID:  Henry Morrison, DOB Aug 25, 1945, MRN [id number] Patient Location:  Home Provider location:   Office  Primary Care Provider:  Garlan Fillers, MD Primary Cardiologist:  Nicki Guadalajara, MD  Chief Complaint / Patient Profile   77 y.o. y/o male with a h/o CAD s/p stenting-OM in 2002, hypertension, and hyperlipidemia who is pending left total knee arthroplasty with Dr. Marcene Corning of [facility] and presents today for telephonic preoperative cardiovascular risk assessment.  History of Present Illness    Henry Morrison is a 77 y.o. male who presents via audio/video conferencing for a telehealth visit today.  Pt was last seen in cardiology clinic on 01/14/2023 by Joni Reining, NP.  At that time BRECKIN BOGUE was doing well.  The patient is now pending procedure as outlined above. Since his last visit, he has done well from a cardiac standpoint.   He denies chest pain, palpitations, dyspnea, pnd, orthopnea, n, v, dizziness, syncope, edema, weight gain, or early satiety. All other systems reviewed and are otherwise negative except as noted above.   Past Medical History    Past Medical History:  Diagnosis Date   Arthritis    CAD (coronary artery disease)    Cancer (HCC)     skin cancer    History of Guillain-Barre syndrome 06/05/2019   Hyperlipidemia    Primary localized osteoarthritis of right knee 06/09/2015   Past Surgical History:  Procedure Laterality Date   COLONOSCOPY     CORONARY ANGIOPLASTY WITH STENT PLACEMENT  10/2000   stenting of the CX vessel   KNEE ARTHROSCOPY W/ MENISCECTOMY Left 07/15/2006   KNEE ARTHROSCOPY W/ MENISCECTOMY Right 02/09/2012   removal of skin cancer     right arm   TOTAL KNEE ARTHROPLASTY Right 06/09/2015   Procedure: RIGHT TOTAL KNEE ARTHROPLASTY;  Surgeon: Salvatore Marvel, MD;  Location: [facility] OR;  Service: Orthopedics;  Laterality: Right;   US ECHOCARDIOGRAPHY  03/07/2012   Trace AI,mild MR,mild to mod TR    Allergies  Allergies  Allergen Reactions   Influenza Vaccines     Guillain-Barr   Other     Seasonal allergies    Home Medications    Prior to Admission medications   Medication Sig Start Date End Date Taking? Authorizing Provider  atorvastatin (LIPITOR) 80 MG tablet Take 1 tablet (80 mg total) by mouth daily. 01/14/23   Jodelle Gross, NP  Evolocumab (REPATHA SURECLICK) 140 MG/ML SOAJ Inject 140 mg into the skin every 14 (fourteen) days. 01/14/23   Jodelle Gross, NP  ezetimibe (ZETIA) 10 MG tablet TAKE ONE TABLET BY MOUTH ONCE DAILY. 01/20/23   Lennette Bihari, MD  fluticasone [facility]) 50 MCG/ACT nasal spray Place 1 spray into both nostrils as needed for allergies.  05/24/13   [provider]  isosorbide  mononitrate (IMDUR) 30 MG 24 hr tablet Take 1 tablet (30 mg total) by mouth daily. 01/14/23   Jodelle Gross, NP  metoprolol succinate (TOPROL-XL) 25 MG 24 hr tablet Take 1 tablet (25 mg total) by mouth daily. 01/14/23   Jodelle Gross, NP  Multiple Vitamin (MULTIVITAMIN) tablet Take 1 tablet by mouth daily.    [provider]  ramipril (ALTACE) 10 MG capsule Take 1 capsule (10 mg total) by mouth daily. 01/14/23   Jodelle Gross, NP    Physical Exam    Vital Signs:   [name] does not have vital signs available for review today.  Given telephonic nature of communication, physical exam is limited. AAOx3. NAD. Normal affect.  Speech and respirations are unlabored.  Accessory Clinical Findings    None  Assessment & Plan    1.  Preoperative Cardiovascular Risk Assessment:  According to the Revised Cardiac Risk Index (RCRI), his Perioperative Risk of Major Cardiac Event is (%): 0.9. His Functional Capacity in METs is: 7.01 according to the Duke Activity Status Index (DASI). Therefore, based on ACC/AHA guidelines, patient would be at acceptable risk for the planned procedure without further cardiovascular testing.   The patient was advised that if he develops new symptoms prior to surgery to contact our office to arrange for a follow-up visit, and he verbalized understanding.  A copy of this note will be routed to requesting surgeon.  Time:   Today, I have spent 5 minutes with the patient with telehealth technology discussing medical history, symptoms, and management plan.     Joylene Grapes, NP  03/31/2023, 10:36 AM

## 2023-06-17 ENCOUNTER — Ambulatory Visit (HOSPITAL_COMMUNITY)
Admission: RE | Admit: 2023-06-17 | Discharge: 2023-06-17 | Disposition: A | Discharge status: Home / Self Care | Payer: Medicare Other | Source: Ambulatory Visit | Attending: Cardiology | Admitting: Cardiology

## 2023-06-17 ENCOUNTER — Other Ambulatory Visit (HOSPITAL_COMMUNITY): Payer: Self-pay | Admitting: Orthopaedic Surgery

## 2023-06-17 DIAGNOSIS — R52 Pain, unspecified: Secondary | ICD-10-CM

## 2023-06-17 DIAGNOSIS — M25562 Pain in left knee: Secondary | ICD-10-CM

## 2023-06-21 ENCOUNTER — Ambulatory Visit
Admission: RE | Admit: 2023-06-21 | Discharge: 2023-06-21 | Disposition: A | Discharge status: Home / Self Care | Payer: Medicare Other | Source: Ambulatory Visit | Attending: Internal Medicine | Admitting: Internal Medicine

## 2023-06-21 ENCOUNTER — Other Ambulatory Visit: Payer: Self-pay | Admitting: Internal Medicine

## 2023-06-21 DIAGNOSIS — R051 Acute cough: Secondary | ICD-10-CM

## 2023-06-21 DIAGNOSIS — R0902 Hypoxemia: Secondary | ICD-10-CM

## 2023-06-21 MED ORDER — IOPAMIDOL (ISOVUE-370) INJECTION 76%
75.0000 mL | Freq: Once | INTRAVENOUS | Status: AC | PRN
  Administered 2023-06-21: 75 mL via INTRAVENOUS

## 2023-07-18 LAB — CBC WITH DIFFERENTIAL/PLATELET

## 2023-07-19 LAB — COMPREHENSIVE METABOLIC PANEL
ALT: 19 IU/L (ref 0–44)
AST: 19 IU/L (ref 0–40)
Albumin: 4 g/dL (ref 3.8–4.8)
Alkaline Phosphatase: 113 IU/L (ref 44–121)
BUN/Creatinine Ratio: 18 (ref 10–24)
BUN: 16 mg/dL (ref 8–27)
Bilirubin Total: 0.5 mg/dL (ref 0.0–1.2)
CO2: 23 mmol/L (ref 20–29)
Calcium: 9.7 mg/dL (ref 8.6–10.2)
Chloride: 104 mmol/L (ref 96–106)
Creatinine, Ser: 0.88 mg/dL (ref 0.76–1.27)
Globulin, Total: 2.5 g/dL (ref 1.5–4.5)
Glucose: 103 mg/dL — ABNORMAL HIGH (ref 70–99)
Potassium: 4.9 mmol/L (ref 3.5–5.2)
Sodium: 139 mmol/L (ref 134–144)
Total Protein: 6.5 g/dL (ref 6.0–8.5)
eGFR: 89 mL/min/{1.73_m2} (ref >59)

## 2023-07-19 LAB — CBC WITH DIFFERENTIAL/PLATELET
Basos: 0 %
EOS (ABSOLUTE): 0 10*3/uL (ref 0.0–0.2)
Eos: 2 %
Hematocrit: 37.8 % (ref 37.5–51.0)
Hemoglobin: 12.3 g/dL — ABNORMAL LOW (ref 13.0–17.7)
Immature Granulocytes: 0 %
Immature Granulocytes: 0 10*3/uL (ref 0.0–0.1)
Lymphs: 24 %
MCH: 30.1 pg (ref 26.6–33.0)
MCHC: 32.5 g/dL (ref 31.5–35.7)
MCV: 92 fL (ref 79–97)
Monocytes Absolute: 0.1 10*3/uL (ref 0.0–0.4)
Monocytes Absolute: 0.6 10*3/uL (ref 0.1–0.9)
Monocytes: 8 %
Neutrophils Absolute: 1.9 10*3/uL (ref 0.7–3.1)
Neutrophils Absolute: 5.2 10*3/uL (ref 1.4–7.0)
Neutrophils: 66 %
Platelets: 245 10*3/uL (ref 150–450)
RBC: 4.09 x10E6/uL — ABNORMAL LOW (ref 4.14–5.80)
RDW: 13.4 % (ref 11.6–15.4)
WBC: 7.9 10*3/uL (ref 3.4–10.8)

## 2023-07-19 LAB — LIPID PANEL
Chol/HDL Ratio: 2 ratio (ref 0.0–5.0)
Cholesterol, Total: 132 mg/dL (ref 100–199)
HDL: 66 mg/dL (ref >39)
LDL Chol Calc (NIH): 49 mg/dL (ref 0–99)
Triglycerides: 92 mg/dL (ref 0–149)
VLDL Cholesterol Cal: 17 mg/dL (ref 5–40)

## 2023-07-19 LAB — TSH: TSH: 1.16 u[IU]/mL (ref 0.450–4.500)

## 2023-07-22 ENCOUNTER — Encounter (HOSPITAL_BASED_OUTPATIENT_CLINIC_OR_DEPARTMENT_OTHER): Payer: Self-pay

## 2023-07-25 ENCOUNTER — Ambulatory Visit (HOSPITAL_BASED_OUTPATIENT_CLINIC_OR_DEPARTMENT_OTHER)

## 2023-07-25 ENCOUNTER — Encounter: Payer: Self-pay | Admitting: Cardiovascular Disease

## 2023-07-25 ENCOUNTER — Ambulatory Visit: Payer: Medicare Other | Attending: Cardiovascular Disease | Admitting: Cardiovascular Disease

## 2023-07-25 DIAGNOSIS — I10 Essential (primary) hypertension: Secondary | ICD-10-CM

## 2023-07-25 DIAGNOSIS — I2510 Atherosclerotic heart disease of native coronary artery without angina pectoris: Secondary | ICD-10-CM | POA: Diagnosis not present

## 2023-07-25 DIAGNOSIS — Z8669 Personal history of other diseases of the nervous system and sense organs: Secondary | ICD-10-CM

## 2023-07-25 DIAGNOSIS — I272 Pulmonary hypertension, unspecified: Secondary | ICD-10-CM

## 2023-07-25 DIAGNOSIS — I700 Atherosclerosis of aorta: Secondary | ICD-10-CM

## 2023-07-25 DIAGNOSIS — Z9229 Personal history of other drug therapy: Secondary | ICD-10-CM

## 2023-07-25 DIAGNOSIS — E7800 Pure hypercholesterolemia, unspecified: Secondary | ICD-10-CM

## 2023-07-25 DIAGNOSIS — E7849 Other hyperlipidemia: Secondary | ICD-10-CM | POA: Diagnosis not present

## 2023-07-25 LAB — ECHOCARDIOGRAM COMPLETE
Area-P 1/2: 3.12 cm2
Height: 70 in
P 1/2 time: 552 ms
S' Lateral: 3 cm
Weight: 3078.4 [oz_av]

## 2023-07-25 NOTE — Patient Instructions (Signed)
 Medication Instructions:  No medication changes were made during today's visit.  *If you need a refill on your cardiac medications before your next appointment, please call your pharmacy*   Lab Work: No labs were ordered during today's visit.  If you have labs (blood work) drawn today and your tests are completely normal, you will receive your results only by: MyChart Message (if you have MyChart) OR A paper copy in the mail If you have any lab test that is abnormal or we need to change your treatment, we will call you to review the results.   Testing/Procedures: Your physician has requested that you have an echocardiogram this week. Echocardiography is a painless test that uses sound waves to create images of your heart. It provides your doctor with information about the size and shape of your heart and how well your heart's chambers and valves are working. This procedure takes approximately one hour. There are no restrictions for this procedure. Please do NOT wear cologne, perfume, aftershave, or lotions (deodorant is allowed). Please arrive 15 minutes prior to your appointment time.  Please note: We ask at that you not bring children with you during ultrasound (echo/ vascular) testing. Due to room size and safety concerns, children are not allowed in the ultrasound rooms during exams. Our front office staff cannot provide observation of children in our lobby area while testing is being conducted. An adult accompanying a patient to their appointment will only be allowed in the ultrasound room at the discretion of the ultrasound technician under special circumstances. We apologize for any inconvenience.    Follow-Up: At [facility], you and your health needs are our priority.  As part of our continuing mission to provide you with exceptional heart care, we have created designated Provider Care Teams.  These Care Teams include your primary Cardiologist (physician) and Advanced  Practice Providers (APPs -  Physician Assistants and Nurse Practitioners) who all work together to provide you with the care you need, when you need it.  We recommend signing up for the patient portal called "MyChart".  Sign up information is provided on this After Visit Summary.  MyChart is used to connect with patients for Virtual Visits (Telemedicine).  Patients are able to view lab/test results, encounter notes, upcoming appointments, etc.  Non-urgent messages can be sent to your provider as well.   To learn more about what you can do with MyChart, go to [email or web address].    Your next appointment:   1 year(s)  Provider:   Dr. Bjorn Pippin     Other Instructions        1st Floor: - Lobby - Registration  - Pharmacy  - Lab - Cafe   2nd Floor: - PV Lab - Diagnostic Testing (echo, CT, nuclear med)   3rd Floor: - Vacant   4th Floor: - TCTS (cardiothoracic surgery) - AFib Clinic - Structural Heart Clinic - Vascular Surgery  - Vascular Ultrasound   5th Floor: - HeartCare Cardiology (general and EP) - Clinical Pharmacy for coumadin, hypertension, lipid, weight-loss medications, and med management appointments      Valet parking services will be available as well.       Thank you for choosing [facility]!

## 2023-07-25 NOTE — Progress Notes (Unsigned)
 Patient ID: DEKE TILGHMAN, male   DOB: 1946-03-29, 78 y.o.   MRN: [id number]    Primary M.D.: Dr. Ivery Quale  HPI: DAVIE CLAUD is a 78 y.o. male who presents to the office for a 12 month cardiology evaluation.   Mr. 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  At that time, he had told me that he had first been told of having high cholesterol approximatey 8 years previously with cholesterols in excess of 400, but he was not on any therapy.  He had undergone a routine treadmill test which reportely was normal by Dr. Daisy Floro.  Of note, his mother had very high cholesterols throughout her life and died of a myocardial infarction at age 24. When the patient was initially referred to me, his laboratory was notable for a total cholesterol of 510 and LDL cholesterol of 422.  Since my initial evaluation, he was started on aspirin therapy as well as initial statin treatment with simvastatin..  As more aggressive statins became available, he ultimately was treated with atorvastatin 80 mg in addition to Zetia 10 mg. Of note a review of his medical record indicates that there was a short period of time when he was traveling excessively and had stopped taking therapy. Subsequent blood work in 1999 upon his return showed his cholesterol had risen back to 343 with an LDL cholesterol of 245.   A nuclear perfusion study in 2002 demonstrated new development of inferolateral ischemia. In June 2002 he underwent stenting of a subtotal occlusion of a circumflex vessel which time a 2.75x23 mm HepaCoat stent was inserted. 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 A nuclear perfusion study in October 2011 continued to show normal  perfusion without scar or ischemia.   He remains active. 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  He has been maintained on atorvastatin 80 mg and Zetia 10 mg for his marked hyperlipidemia. His blood pressure has been treated with metoprolol succinate 25 mg as well as altace 10 mg. He also is on low dose isosorbide mononitrate and has not been experiencing any recurrent anginal symptoms.  Blood work done on 06/17/2014:  total cholesterol 194, triglycerides 72, HDL 66, and LDL cholesterol is 114. Liver tests are normal. Renal function is normal with a creatinine of 0.8.  Fasting glucose was 94.  Prior to undergoing knee replacement surgery  he underwent a nuclear perfusion study on 08/06/2014.  This remained low risk and did not demonstrate scar or ischemia.  He had normal LV function.  He underwent his knee replacement in January 2017 and has done well from a cardiovascular standpoint.  After seeing me in 2016, I had a long discussion with the patient and felt he was a candidate for PCSK-9 inhibition therapy.  He has been on Prilosec when 75 mg every 14 days since the spring of 2016.  This has resulted in dramatic benefit and lab work on 12/24/2014 showed a total cholesterol of 150 with an LDL cholesterol of 43, and blood work on 04/14/2015 revealed a total cholesterol 141 and LDL cholesterol at 36.  HDL was 93, VLDL 12, and  triglycerides 59.  Subsequent laboratory continued to show improvement in LDL and in January 2018 LDL had reduced to 24.  He recently underwent subsequent blood work on 07/09/2016 which showed an LDL of 57.  He continues to be on atorvastatin 80 mg, Zetia 10 mg, and apparently is only on the reduced dose of Praluent at 75 mg every 2 weeks.    I recommended he increase Praluent to 150 mg since his LDL had increased into the 40s.  Subsequent blood work demonstrated significant improvement with LDL back down to 24.  I saw him in  March 2019 at which time he was now fully retired. He was staying active with his grandchildren and travels.  He denies recurrent chest pain, PND, orthopnea. He had just started on Medicare,  as result , as result, he now has to pay for PCSK9 whereas previously his commercial insurance paid 100%.  Because of this and the recently reduced cost of Repatha, which would result in a lower Medicare co-pay cost he was switched to Repatha. .  He has tolerated this well, but his co-pay is still over $100 per month.  He continues to be on concomitant therapy with atorvastatin 80 mg and Zetia 10 mg.  He is not having any anginal symptoms on isosorbide 30 mg and Toprol 25 mg.  He  underwent a three-year f/u nuclear perfusion study on 08/01/2017 which remained entirely normal.  He presents for reevaluation.  Since I saw him in September 2019 he unfortunately developed Guillan-Barr syndrome which started 1 to 2 days after getting a flu vaccine.  He initially noticed tingling in his feet day after the flu shot the following day collapsed while in the shower.  He was unable to ambulate.  While hospitalized he underwent a 5-day course of IVIG and lumbar puncture showed modest elevation in the spinal fluid protein.  He completed inpatient rehabilitation and ultimately has sniffily improved.  It took him 3 months to be able to button his shirt.  He still notes some occasional tingling of his feet.   I saw him in March 2020 which time he was remaining fairly active and denied any episodes of angina.  He continues to work in his yard and with her projects without discomfort or shortness of breath.  He keeps active with his grand children and great-grandchildren.  He is unaware of palpitations.  He is tolerating Repatha well without side effects.  He continues to be on atorvastatin 80 mg and Zetia 10 mg.  His t lipid panel on July 24, 2018 at [facility] has shown an LDL cholesterol at 41.  This has risen slightly compared to 1  year ago when his LDL was 24 and on another lab where it was 87.  He had questioned whether or not his atorvastatin dose could be reduced.  He continues to be on ramipril 10 mg, Toprol-XL 25 mg, and isosorbide mononitrate 30 mg for his CAD and there is no angina on therapy.   I saw him in June 2021 and over the prior year he continued to be asymptomatic.  He underwent repeat blood work in March 2021 by Dr. Jarold Motto at [facility].  Total cholesterol was 109, LDL cholesterol 33, triglycerides 80 and HDL 60.  TSH was normal.  LFTs were normal.  He continues to be on atorvastatin 80 mg, Zetia 10 mg and Repatha 140 migrans per mL every 2 weeks.  He is not having angina on isosorbide 30 mg in addition  to metoprolol succinate 25 mg daily.  He also is on ramipril for blood pressure control.   I last saw him on October 23, 2020 and since his prior evaluation he continues to be stable.   He is keeping very busy in his retired life caring for his children and grandchildren.  He did receive 2 of vaccinations and also 1 booster.  He continues to tolerate atorvastatin 80 mg, Zetia 10 mg, and Repatha 140 mg every 2 weeks for his familial hyperlipidemia.  Lipid studies in March 2022 showed total cholesterol 117, triglycerides 75, HDL 66 and LDL 36.  He continues to be on isosorbide 30 mg daily in addition to metoprolol succinate 25 mg and ramipril 10 mg daily for hypertension and CAD.  He remains very active.  He has been traveling.  Recent PSA was 4.185 and he was advised by Dr. Jarold Motto to undergo a urologic evaluation which has not yet been done.    Since I last saw him, he feels well.  He now has been doing Research scientist (medical) work and has been having to travel to [place] there in several occasions.  He saw Dr. Ivery Quale in January 2023.  Lipid studies showed total cholesterol 159, triglycerides 89, HDL 63, and LDL had risen to 78.  He had normal renal function with a BUN of 20 creatinine 1.0.  LFTs were normal.   Hemoglobin hematocrit were stable.  He does travel extensively and will be taking several vacations with his family this year.  He denies any recurrent chest pain or shortness of breath.  He is unaware of palpitations.  He presents for yearly evaluation.  Past Medical History:  Diagnosis Date   Arthritis    CAD (coronary artery disease)    Cancer (HCC)    skin cancer    History of Guillain-Barre syndrome 06/05/2019   Hyperlipidemia    Primary localized osteoarthritis of right knee 06/09/2015    Past Surgical History:  Procedure Laterality Date   COLONOSCOPY     CORONARY ANGIOPLASTY WITH STENT PLACEMENT  10/2000   stenting of the CX vessel   KNEE ARTHROSCOPY W/ MENISCECTOMY Left 07/15/2006   KNEE ARTHROSCOPY W/ MENISCECTOMY Right 02/09/2012   removal of skin cancer     right arm   TOTAL KNEE ARTHROPLASTY Right 06/09/2015   Procedure: RIGHT TOTAL KNEE ARTHROPLASTY;  Surgeon: Salvatore Marvel, MD;  Location: [facility] OR;  Service: Orthopedics;  Laterality: Right;   US ECHOCARDIOGRAPHY  03/07/2012   Trace AI,mild MR,mild to mod TR    Allergies  Allergen Reactions   Influenza Vaccines     Guillain-Barr   Other     Seasonal allergies    Current Outpatient Medications  Medication Sig Dispense Refill   atorvastatin (LIPITOR) 80 MG tablet Take 1 tablet (80 mg total) by mouth daily. 90 tablet 3   Evolocumab (REPATHA SURECLICK) 140 MG/ML SOAJ Inject 140 mg into the skin every 14 (fourteen) days. 6 mL 3   ezetimibe (ZETIA) 10 MG tablet TAKE ONE TABLET BY MOUTH ONCE DAILY. 90 tablet 3   fluticasone (FLONASE) 50 MCG/ACT nasal spray Place 1 spray into both nostrils as needed for allergies.      isosorbide mononitrate (IMDUR) 30 MG 24 hr tablet Take 1 tablet (30 mg total) by mouth daily. 90 tablet 3   metoprolol succinate (TOPROL-XL) 25 MG 24 hr tablet Take 1 tablet (25 mg total) by mouth daily. 90 tablet 2   Multiple Vitamin (MULTIVITAMIN) tablet Take 1 tablet by mouth  daily.     ramipril (ALTACE) 10  MG capsule Take 1 capsule (10 mg total) by mouth daily. 90 capsule 3   No current facility-administered medications for this visit.    Social History   Socioeconomic History   Marital status: Married    Spouse name: Not on file   Number of children: Not on file   Years of education: Not on file   Highest education level: Not on file  Occupational History   Not on file  Tobacco Use   Smoking status: Never   Smokeless tobacco: Never  Vaping Use   Vaping status: Never Used  Substance and Sexual Activity   Alcohol use: Yes    Alcohol/week: 10.0 standard drinks of alcohol    Types: 10 Standard drinks or equivalent per week    Comment: wine 2 galsses each day   Drug use: No   Sexual activity: Not on file  Other Topics Concern   Not on file  Social History Narrative   Not on file   Social Drivers of Health   Financial Resource Strain: Not on file  Food Insecurity: Not on file  Transportation Needs: Not on file  Physical Activity: Not on file  Stress: Not on file  Social Connections: Not on file  Intimate Partner Violence: Not on file    Family History  Problem Relation Age of Onset   Heart disease Mother    Cancer Father        kidney    ROS General: Negative; No fevers, chills, or night sweats;  HEENT: Negative; No changes in vision or hearing, sinus congestion, difficulty swallowing Pulmonary: Negative; No cough, wheezing, shortness of breath, hemoptysis Cardiovascular: See HPI, history of familial hyperlipidemia GI: Negative; No nausea, vomiting, diarrhea, or abdominal pain GU: Negative; No dysuria, hematuria, or difficulty voiding Musculoskeletal: Negative; no myalgias, joint pain, or weakness Hematologic/Oncology: Negative; no easy bruising, bleeding Endocrine: Negative; no heat/cold intolerance; no diabetes Neuro: Negative; no changes in balance, headaches Skin:  History of melanoma resection Psychiatric: Negative; No behavioral problems,  depression Sleep: Negative; No snoring, daytime sleepiness, hypersomnolence, bruxism, restless legs, hypnogognic hallucinations, no cataplexy Other comprehensive 14 point system review is negative.   PE BP 132/84   Pulse 82   Ht 5\' 10"  (1.778 m)   Wt 192 lb 6.4 oz (87.3 kg)   SpO2 95%   BMI 27.61 kg/m    Repeat blood pressure was 134/76; states his blood pressure at home typically is in the 120s and 70s  Wt Readings from Last 3 Encounters:  07/25/23 192 lb 6.4 oz (87.3 kg)  01/14/23 198 lb 12.8 oz (90.2 kg)  07/16/22 195 lb (88.5 kg)   General: Alert, oriented, no distress.  Skin: normal turgor, no rashes, warm and dry HEENT: Normocephalic, atraumatic. Pupils equal round and reactive to light; sclera anicteric; extraocular muscles intact; Fundi ** Nose without nasal septal hypertrophy Mouth/Parynx benign; Mallinpatti scale 2 Neck: No JVD, no carotid bruits; normal carotid upstroke Lungs: clear to ausculatation and percussion; no wheezing or rales Chest wall: without tenderness to palpitation Heart: PMI not displaced, RRR, s1 s2 normal, 1/6 systolic murmur, no diastolic murmur, no rubs, gallops, thrills, or heaves Abdomen: soft, nontender; no hepatosplenomehaly, BS+; abdominal aorta nontender and not dilated by palpation. Back: no CVA tenderness Pulses 2+ Musculoskeletal: full range of motion, normal strength, no joint deformities Extremities: no clubbing cyanosis or edema, Homan's sign negative  Neurologic: grossly nonfocal; Cranial nerves grossly wnl Psychologic: Normal mood and  affect   October 29, 2021 ECG (independently read by me): NSR at 62, no ectopy, normal intervals  October 23, 2020 ECG (independently read by me): Sinus bradycardia 54 bpm with PAC.  June 2021 ECG (independently read by me): Sinus bradycardia 58 bpm.  Normal intervals.  No ectopy  March 2020 ECG (independently read by me): Sinus bradycardia 59 bpm.  Isolated PVC.  Normal intervals.  March 2018 ECG  (independently read by me): sinus bradycardia 54 bpm.  Normal intervals.  No ST segment changes.  July 2017 ECG (independently read by me): Normal sinus rhythm at 78 bpm.  Normal intervals.  No significant ST segment changes.  February 2016 ECG (independently read by me): Sinus bradycardia 54 bpm.  Normal intervals.  No ST segment changes.  January 2015ECG (independently read by me): Normal sinus rhythm. No significant ST-T changes. PoorR wave progression.  LABS:    Latest Ref Rng & Units 07/18/2023    8:28 AM 01/26/2022    8:13 AM 01/11/2022    8:27 AM  BMP  Glucose 70 - 99 mg/dL 811  914  97   BUN 8 - 27 mg/dL 16  21  24    Creatinine 0.76 - 1.27 mg/dL 7.82  9.56  2.13   BUN/Creat Ratio 10 - 24 18  24  28    Sodium 134 - 144 mmol/L 139  142  140   Potassium 3.5 - 5.2 mmol/L 4.9  5.1  5.5   Chloride 96 - 106 mmol/L 104  106  104   CO2 20 - 29 mmol/L 23  23  23    Calcium 8.6 - 10.2 mg/dL 9.7  9.9  08.6       Latest Ref Rng & Units 07/18/2023    8:28 AM 01/11/2022    8:27 AM 02/10/2018    5:46 AM  Hepatic Function  Total Protein 6.0 - 8.5 g/dL 6.5  7.0  8.7   Albumin 3.8 - 4.8 g/dL 4.0  4.5  3.4   AST 0 - 40 IU/L 19  25  81   ALT 0 - 44 IU/L 19  29  137   Alk Phosphatase 44 - 121 IU/L 113  77  80   Total Bilirubin 0.0 - 1.2 mg/dL 0.5  0.7  1.1       Latest Ref Rng & Units 07/18/2023    8:28 AM 02/10/2018    5:46 AM 02/09/2018    2:41 PM  CBC  WBC 3.4 - 10.8 x10E3/uL 7.9  5.4  8.4   Hemoglobin 13.0 - 17.7 g/dL 57.8  46.9  62.9   Hematocrit 37.5 - 51.0 % 37.8  42.4  42.8   Platelets 150 - 450 x10E3/uL 245  227  298    Lab Results  Component Value Date   MCV 92 07/18/2023   MCV 91.4 02/10/2018   MCV 91.5 02/09/2018   Lab Results  Component Value Date   TSH 1.160 07/18/2023    Lab Results  Component Value Date   HGBA1C 6.2 (H) 02/03/2018   Lipid Panel     Component Value Date/Time   CHOL 132 07/18/2023 0828   TRIG 92 07/18/2023 0828   HDL 66 07/18/2023 0828   CHOLHDL  2.0 07/18/2023 0828   CHOLHDL 1.4 05/19/2016 0807   VLDL 13 05/19/2016 0807   LDLCALC 49 07/18/2023 0828   Blood work by Dr. Jarold Motto of [facility] from 07/09/2016 was reviewed Cholesterol 125, triglycerides 71, HDL 54, LDL 57.  I reviewed most recent laboratory from [facility] medical collected on 03/12/2018.  Total cholesterol was 125, triglycerides 86, HDL 66, LDL 42.   Most recent laboratory from July 24, 2018 from [facility] were reviewed. Lester 111, triglycerides 70, HDL 56, LDL 41. Glucose 97.  BUN 26 creatinine 0.8.  LFTs normal. Hemoglobin 13.8 hematocrit 42.9. PSA had increased from 2.3-4.345 and he was given a prescription for Cipro pro 500 mg twice a day with plans to recheck a PSA in 3 weeks  Lab work from Dr. Jarold Motto from January 2023 was reviewed.  Total cholesterol 159, triglycerides 89, HDL 63, LDL 78.  LFTs were normal.  Renal function normal.  Hemoglobin hematocrit and platelets stable.  IMPRESSION:  1. Essential hypertension   2. Hypercholesterolemia   3. Coronary artery disease involving native coronary artery of native heart without angina pectoris   4. Primary hypertension   5. Familial hyperlipidemia, high LDL    ASSESSMENT AND PLAN: Mr. Akim Watkinson is a 78 year-old gentleman who has familial hyperlipidemia with initial cholesterols in excess of 500 and LDL cholesterols in excess of 400. Over the last 30 years, I have been aggressively managing his lipid studies.  In 2002 he was found to have a subtotally occluded obtuse marginal 1 stenosis after nuclear study suggested inferolateral ischemia. He underwent successful intervention with stenting of a subtotally occluded circumflex vessel. There was concomitant CAD, also involving his LAD and RCA.  A nuclear study done in 2016 for preoperative clearance prior to his knee surgery revealed continued normal perfusion without scar or ischemia.  Based on the [place] diagnostic criteria he  has familial hypercholesterolemia with a score of at least 13.  He has been on PCSK9 inhibition since 2016.  He has continued to take atorvastatin 80 mg and Zetia 10 mg.   Since starting PCSK9 inhibition therapy, his LDL cholesterols had consistently been under 50 and had been in the mid 20s on several occasions.  He  switched to Repatha due to cost continues to be on 140 mg every 2 weeks.  Laboratory done on August 03, 2019 at [facility] was outstanding with a total cholesterol 109, triglycerides 80, HDL 60 and LDL 33 and lipid studies from July 24, 2020  revealed stable laboratory with a LDL cholesterol at 36.  He has had stable blood pressure and his blood pressure today initially was 124/76 on his regimen of ramipril 10 mg, metoprolol 25 mg daily and he continues to be on isosorbide 30 mg.  He denies any anginal symptomatology.  His last nuclear perfusion study was in March 2019 which remained entirely normal.  He had question whether or not he should have another nuclear perfusion study.  Clinically he is stable.  However I have suggested that next March in 2024 he had a 5-year follow-up evaluation.  I reviewed the recent laboratory from [facility] medical from January 2023.  On this laboratory his LDL cholesterol had risen to 78.  He admits that he had been traveling fairly extensively and his diet may not been as optimal as previously.  I have suggested in August he have a follow-up fasting lipid panel and will also check an LP(a) and comprehensive metabolic panel.  He continues to do active doing yard work.  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  I will schedule him for an exercise Myoview study in March 2024 and will see him in follow-up for further evaluation after  his stress test   Lennette Bihari, MD, [facility]  07/25/2023 10:12 AM

## 2023-07-26 ENCOUNTER — Telehealth: Payer: Self-pay | Admitting: Cardiovascular Disease

## 2023-07-26 NOTE — Telephone Encounter (Signed)
Pt calling for Echo results

## 2023-07-27 ENCOUNTER — Encounter: Payer: Self-pay | Admitting: Cardiovascular Disease

## 2024-01-09 ENCOUNTER — Other Ambulatory Visit: Payer: Self-pay | Admitting: Adult Health

## 2024-01-23 ENCOUNTER — Other Ambulatory Visit: Payer: Self-pay | Admitting: Adult Health

## 2024-01-23 DIAGNOSIS — I2510 Atherosclerotic heart disease of native coronary artery without angina pectoris: Secondary | ICD-10-CM

## 2024-01-23 DIAGNOSIS — E7849 Other hyperlipidemia: Secondary | ICD-10-CM

## 2024-02-01 ENCOUNTER — Other Ambulatory Visit: Payer: Self-pay | Admitting: Adult Health

## 2024-02-01 MED ORDER — EZETIMIBE 10 MG PO TABS: 10.0000 mg | ORAL_TABLET | Freq: Every day | ORAL | 1 refills | Status: AC

## 2024-02-14 ENCOUNTER — Other Ambulatory Visit: Payer: Self-pay | Admitting: Adult Health

## 2024-06-05 ENCOUNTER — Other Ambulatory Visit: Payer: Self-pay | Admitting: Adult Health

## 2024-07-12 ENCOUNTER — Ambulatory Visit: Admitting: Cardiology
